# Patient Record
Sex: Female | Born: 1985 | Race: White | Hispanic: No | Marital: Single | Smoking: Current every day smoker
Health system: Southern US, Community
[De-identification: ages and names within clinical notes are randomized; demographics above are authoritative.]

## PROBLEM LIST (undated history)

## (undated) DIAGNOSIS — D649 Anemia, unspecified: Secondary | ICD-10-CM

## (undated) DIAGNOSIS — R519 Headache, unspecified: Secondary | ICD-10-CM

## (undated) DIAGNOSIS — G8929 Other chronic pain: Secondary | ICD-10-CM

## (undated) DIAGNOSIS — F17210 Nicotine dependence, cigarettes, uncomplicated: Secondary | ICD-10-CM

## (undated) DIAGNOSIS — R0600 Dyspnea, unspecified: Secondary | ICD-10-CM

## (undated) DIAGNOSIS — Z5189 Encounter for other specified aftercare: Secondary | ICD-10-CM

## (undated) DIAGNOSIS — K259 Gastric ulcer, unspecified as acute or chronic, without hemorrhage or perforation: Secondary | ICD-10-CM

## (undated) DIAGNOSIS — T7840XA Allergy, unspecified, initial encounter: Secondary | ICD-10-CM

## (undated) DIAGNOSIS — D259 Leiomyoma of uterus, unspecified: Secondary | ICD-10-CM

## (undated) DIAGNOSIS — N921 Excessive and frequent menstruation with irregular cycle: Secondary | ICD-10-CM

## (undated) DIAGNOSIS — R51 Headache: Secondary | ICD-10-CM

## (undated) HISTORY — PX: CHOLECYSTECTOMY: SHX55

## (undated) HISTORY — PX: ABDOMINAL HYSTERECTOMY: SHX81

## (undated) HISTORY — PX: TUBAL LIGATION: SHX77

## (undated) HISTORY — PX: OTHER SURGICAL HISTORY: SHX169

## (undated) HISTORY — DX: Encounter for other specified aftercare: Z51.89

## (undated) HISTORY — PX: TONSILLECTOMY: SUR1361

## (undated) HISTORY — DX: Allergy, unspecified, initial encounter: T78.40XA

---

## 2005-04-03 ENCOUNTER — Emergency Department: Payer: Self-pay | Admitting: Emergency Medicine

## 2005-05-24 ENCOUNTER — Emergency Department: Payer: Self-pay | Admitting: Emergency Medicine

## 2005-05-26 ENCOUNTER — Ambulatory Visit: Payer: Self-pay | Admitting: Emergency Medicine

## 2005-06-01 ENCOUNTER — Emergency Department: Payer: Self-pay | Admitting: Emergency Medicine

## 2005-07-10 ENCOUNTER — Observation Stay: Payer: Self-pay | Admitting: Obstetrics and Gynecology

## 2005-08-18 ENCOUNTER — Emergency Department: Payer: Self-pay | Admitting: Emergency Medicine

## 2005-10-23 ENCOUNTER — Observation Stay: Payer: Self-pay | Admitting: Obstetrics and Gynecology

## 2005-10-26 ENCOUNTER — Inpatient Hospital Stay: Payer: Self-pay

## 2006-10-19 ENCOUNTER — Emergency Department: Payer: Self-pay | Admitting: Emergency Medicine

## 2007-03-23 ENCOUNTER — Emergency Department: Payer: Self-pay | Admitting: Unknown Physician Specialty

## 2007-07-25 ENCOUNTER — Other Ambulatory Visit: Payer: Self-pay

## 2007-07-25 ENCOUNTER — Emergency Department: Payer: Self-pay | Admitting: Unknown Physician Specialty

## 2007-11-06 ENCOUNTER — Emergency Department: Payer: Self-pay | Admitting: Emergency Medicine

## 2007-12-09 ENCOUNTER — Emergency Department: Payer: Self-pay | Admitting: Emergency Medicine

## 2008-01-28 ENCOUNTER — Emergency Department: Payer: Self-pay | Admitting: Emergency Medicine

## 2008-07-04 ENCOUNTER — Emergency Department: Payer: Self-pay | Admitting: Emergency Medicine

## 2008-07-09 ENCOUNTER — Emergency Department: Payer: Self-pay | Admitting: Emergency Medicine

## 2009-02-21 ENCOUNTER — Emergency Department: Payer: Self-pay | Admitting: Emergency Medicine

## 2009-05-08 ENCOUNTER — Emergency Department: Payer: Self-pay | Admitting: Emergency Medicine

## 2009-05-14 ENCOUNTER — Emergency Department: Payer: Self-pay | Admitting: Emergency Medicine

## 2009-05-18 ENCOUNTER — Ambulatory Visit: Payer: Self-pay | Admitting: Surgery

## 2009-05-22 ENCOUNTER — Emergency Department: Payer: Self-pay | Admitting: Emergency Medicine

## 2009-12-08 ENCOUNTER — Emergency Department: Payer: Self-pay | Admitting: Unknown Physician Specialty

## 2009-12-29 ENCOUNTER — Emergency Department: Payer: Self-pay | Admitting: Emergency Medicine

## 2010-04-15 ENCOUNTER — Ambulatory Visit: Payer: Self-pay | Admitting: Family Medicine

## 2010-06-05 ENCOUNTER — Encounter: Payer: Self-pay | Admitting: Physician Assistant

## 2010-06-27 ENCOUNTER — Observation Stay: Payer: Self-pay | Admitting: Obstetrics and Gynecology

## 2010-06-29 ENCOUNTER — Encounter: Payer: Self-pay | Admitting: Physician Assistant

## 2010-07-05 ENCOUNTER — Emergency Department: Payer: Self-pay | Admitting: Emergency Medicine

## 2010-09-01 ENCOUNTER — Observation Stay: Payer: Self-pay

## 2010-09-04 ENCOUNTER — Inpatient Hospital Stay: Payer: Self-pay | Admitting: Obstetrics and Gynecology

## 2012-02-16 ENCOUNTER — Emergency Department: Payer: Self-pay | Admitting: Emergency Medicine

## 2012-10-17 ENCOUNTER — Emergency Department: Payer: Self-pay | Admitting: Emergency Medicine

## 2012-10-20 ENCOUNTER — Emergency Department: Payer: Self-pay | Admitting: Internal Medicine

## 2012-12-01 ENCOUNTER — Emergency Department: Payer: Self-pay

## 2012-12-01 LAB — COMPREHENSIVE METABOLIC PANEL
Alkaline Phosphatase: 77 U/L (ref 50–136)
Anion Gap: 4 — ABNORMAL LOW (ref 7–16)
BUN: 7 mg/dL (ref 7–18)
Bilirubin,Total: 0.2 mg/dL (ref 0.2–1.0)
Chloride: 108 mmol/L — ABNORMAL HIGH (ref 98–107)
Co2: 25 mmol/L (ref 21–32)
Creatinine: 0.48 mg/dL — ABNORMAL LOW (ref 0.60–1.30)
Glucose: 86 mg/dL (ref 65–99)
Osmolality: 271 (ref 275–301)
Potassium: 3.6 mmol/L (ref 3.5–5.1)
SGPT (ALT): 14 U/L (ref 12–78)
Sodium: 137 mmol/L (ref 136–145)
Total Protein: 7.3 g/dL (ref 6.4–8.2)

## 2012-12-01 LAB — URINALYSIS, COMPLETE
Glucose,UR: NEGATIVE mg/dL (ref 0–75)
Leukocyte Esterase: NEGATIVE
Ph: 6 (ref 4.5–8.0)
Protein: 30
RBC,UR: 15 /HPF (ref 0–5)
WBC UR: 1 /HPF (ref 0–5)

## 2012-12-01 LAB — CBC
HCT: 38.3 % (ref 35.0–47.0)
HGB: 12.8 g/dL (ref 12.0–16.0)
MCH: 28.4 pg (ref 26.0–34.0)
MCV: 85 fL (ref 80–100)
Platelet: 202 10*3/uL (ref 150–440)
RBC: 4.51 10*6/uL (ref 3.80–5.20)
RDW: 14 % (ref 11.5–14.5)

## 2012-12-01 LAB — HCG, QUANTITATIVE, PREGNANCY: Beta Hcg, Quant.: 71129 m[IU]/mL — ABNORMAL HIGH

## 2013-01-24 ENCOUNTER — Encounter: Payer: Self-pay | Admitting: Family Medicine

## 2013-01-27 ENCOUNTER — Encounter: Payer: Self-pay | Admitting: Family Medicine

## 2013-01-31 ENCOUNTER — Emergency Department: Payer: Self-pay | Admitting: Emergency Medicine

## 2013-01-31 LAB — COMPREHENSIVE METABOLIC PANEL
Alkaline Phosphatase: 93 U/L (ref 50–136)
Anion Gap: 9 (ref 7–16)
BUN: 3 mg/dL — ABNORMAL LOW (ref 7–18)
Calcium, Total: 8.4 mg/dL — ABNORMAL LOW (ref 8.5–10.1)
Chloride: 109 mmol/L — ABNORMAL HIGH (ref 98–107)
Creatinine: 0.43 mg/dL — ABNORMAL LOW (ref 0.60–1.30)
EGFR (African American): >60
EGFR (Non-African Amer.): >60
Glucose: 88 mg/dL (ref 65–99)
Osmolality: 272 (ref 275–301)
Potassium: 3.4 mmol/L — ABNORMAL LOW (ref 3.5–5.1)
SGOT(AST): 19 U/L (ref 15–37)
SGPT (ALT): 12 U/L (ref 12–78)
Sodium: 138 mmol/L (ref 136–145)
Total Protein: 6.6 g/dL (ref 6.4–8.2)

## 2013-01-31 LAB — URINALYSIS, COMPLETE
Ketone: NEGATIVE
Leukocyte Esterase: NEGATIVE
Ph: 6 (ref 4.5–8.0)
Protein: NEGATIVE
Squamous Epithelial: 4
WBC UR: <1 /HPF (ref 0–5)

## 2013-01-31 LAB — LIPASE, BLOOD: Lipase: 97 U/L (ref 73–393)

## 2013-01-31 LAB — CBC
HCT: 32.9 % — ABNORMAL LOW (ref 35.0–47.0)
MCH: 28.7 pg (ref 26.0–34.0)
Platelet: 207 10*3/uL (ref 150–440)
RBC: 3.89 10*6/uL (ref 3.80–5.20)
WBC: 8 10*3/uL (ref 3.6–11.0)

## 2013-03-03 ENCOUNTER — Emergency Department: Payer: Self-pay | Admitting: Emergency Medicine

## 2013-03-08 ENCOUNTER — Emergency Department: Payer: Self-pay | Admitting: Emergency Medicine

## 2013-04-26 ENCOUNTER — Observation Stay: Payer: Self-pay | Admitting: Obstetrics and Gynecology

## 2013-04-26 LAB — URINALYSIS, COMPLETE
Nitrite: NEGATIVE
Ph: 6 (ref 4.5–8.0)
Protein: NEGATIVE
RBC,UR: <1 /HPF (ref 0–5)
Specific Gravity: 1.009 (ref 1.003–1.030)
Squamous Epithelial: 4
WBC UR: 1 /HPF (ref 0–5)

## 2013-05-14 ENCOUNTER — Observation Stay: Payer: Self-pay

## 2013-05-18 ENCOUNTER — Observation Stay: Payer: Self-pay

## 2013-06-05 ENCOUNTER — Observation Stay: Payer: Self-pay

## 2013-06-09 ENCOUNTER — Ambulatory Visit: Payer: Self-pay | Admitting: Obstetrics and Gynecology

## 2013-06-09 LAB — CBC WITH DIFFERENTIAL/PLATELET
Basophil #: 0 10*3/uL (ref 0.0–0.1)
Eosinophil #: 0.1 10*3/uL (ref 0.0–0.7)
HCT: 30.7 % — ABNORMAL LOW (ref 35.0–47.0)
Lymphocyte %: 18.3 %
MCHC: 32.4 g/dL (ref 32.0–36.0)
MCV: 74 fL — ABNORMAL LOW (ref 80–100)
Monocyte %: 3.6 %
Neutrophil #: 6.9 10*3/uL — ABNORMAL HIGH (ref 1.4–6.5)
Neutrophil %: 76.6 %
RBC: 4.17 10*6/uL (ref 3.80–5.20)
RDW: 17.8 % — ABNORMAL HIGH (ref 11.5–14.5)
WBC: 9 10*3/uL (ref 3.6–11.0)

## 2013-06-10 ENCOUNTER — Inpatient Hospital Stay: Payer: Self-pay | Admitting: Obstetrics and Gynecology

## 2013-06-11 LAB — HEMATOCRIT: HCT: 26.7 % — ABNORMAL LOW (ref 35.0–47.0)

## 2014-01-03 ENCOUNTER — Emergency Department: Payer: Self-pay | Admitting: Emergency Medicine

## 2014-09-09 ENCOUNTER — Emergency Department: Payer: Self-pay | Admitting: Emergency Medicine

## 2014-09-09 LAB — URINALYSIS, COMPLETE
BILIRUBIN, UR: NEGATIVE
Bacteria: NONE SEEN
Glucose,UR: NEGATIVE mg/dL (ref 0–75)
Ketone: NEGATIVE
NITRITE: NEGATIVE
PROTEIN: NEGATIVE
Ph: 5 (ref 4.5–8.0)
RBC,UR: 12 /HPF (ref 0–5)
SPECIFIC GRAVITY: 1.025 (ref 1.003–1.030)
Squamous Epithelial: 1

## 2014-09-11 LAB — URINE CULTURE

## 2014-11-17 ENCOUNTER — Emergency Department: Payer: Self-pay | Admitting: Emergency Medicine

## 2015-01-19 NOTE — Op Note (Signed)
PATIENT NAME:  Faith Ellis, Faith Ellis MR#:  [id number] DATE OF BIRTH:  1986/05/25  DATE OF PROCEDURE:  06/10/2013  PREOPERATIVE DIAGNOSES:  1. Elective repeat cesarean section, 39 plus 6 weeks' gestation.  2. Elective permanent sterilization.  3. Elective skin tag removal.   POSTOPERATIVE DIAGNOSES:  1. Elective repeat cesarean section, 39 plus 6 weeks' gestation.  2. Elective permanent sterilization.  3. Elective skin tag removal.  4. Left paraovarian cyst.   PROCEDURES:  1. Repeat low transverse cesarean section.  2. Bilateral tubal ligation, [place].  3. Left paraovarian cystectomy.  4. Epithelial skin tag removal. 5. On-Q pump placement.   ANESTHESIA: Spinal.   SURGEON: Boykin Nearing, MD  FIRST ASSISTANT: Scrub tech Brame.   INDICATION: This is a 29 year old gravida 4, para 3 patient, EDC of 06/11/2013. The patient with a previous cesarean section, elects for repeat cesarean section and elective permanent sterilization.   PROCEDURE: After adequate spinal anesthesia, the patient was placed in the dorsal supine position with hip roll under the right side. The patient received 2 grams of IV Ancef prior to commencement of the case. A Pfannenstiel incision was made 2 fingerbreadths above the symphysis pubis. Sharp dissection was used to identify the fascia. The fascia was opened in the midline and opened in a transverse fashion. The superior aspect of the fascia was grasped with Kocher clamps and the recti muscles dissected free. The inferior aspect of the fascia was grasped with Kocher clamps, and the pyramidalis muscle was dissected free. Entry into the peritoneal cavity was accomplished sharply. The vesicouterine peritoneal fold was identified, and a bladder flap was created, and the bladder was reflected inferiorly. A low transverse uterine incision was made. Upon entry into the endometrial cavity, clear fluid resulted. The fetal head was brought to the incision. A vacuum was  applied to the occiput, and with 1 gentle pull, the head was delivered, and the vacuum was removed. The shoulders and body delivered without difficulty. Cord was doubly clamped. Vigorous female was passed to nursery staff, who assigned Apgar scores of 9 and 9. The placenta was manually delivered. The uterus was exteriorized, and the endometrial cavity was wiped clean with laparotomy tape. The cervix was opened with ring forceps. Uterine incision was closed with 1 chromic suture in a running locking fashion. Attention was directed to the fallopian tubes. The right fallopian tube was grasped at the midportion, and 2 separate 0 plain gut sutures were applied and a 1.5 cm portion of the fallopian tube removed. A similar procedure was repeated on the patient's left fallopian tube. Again, after grasping the tube at the midportion, 2 separate 0 plain gut sutures were applied and a 1.5 cm portion of the fallopian tube removed. There was a bilobed left paraovarian cyst that was identified with some purulent material. The cyst was clamped, removed and sutured. The cyst will be sent to pathology for identification. Posterior cul-de-sac was irrigated and suctioned, and the uterus was placed back into the abdominal cavity. The paracolic gutters were wiped clean with laparotomy tape. The tubal ligation sites appeared hemostatic as well as the uterine incision site. INTERCEED was placed over the uterine incision in a T-shaped fashion. The superior aspect of the fascia was regrasped with Kocher clamps, and the On-Q pump catheters were advanced from an infraumbilical to a subfascial position, and the fascia was then closed over top of this with 0 Vicryl suture in a running nonlocking fashion. Subcutaneous tissues were irrigated and bovied for hemostasis. Skin  was reapproximated with staples. There was a fibroepithelial skin tag approximately 3 cm from the left lateral aspect of the Pfannenstiel incision. This was grasped and  removed, and this tissue will not be sent to pathology. A single 3-0 Vicryl suture placed to close the dermis. The On-Q pump catheters were secured at the skin level with Dermabond, and Steri-Strip and Tegaderm placed over these catheters, and each catheter was loaded with 5 mL of 0.5% Marcaine. There were no complications. Estimated blood loss 500 mL. Intraoperative fluids 800 mL. The patient tolerated the procedure well and was taken sent to the recovery room in good condition.    ____________________________ Boykin Nearing, MD tjs:OSi D: 06/10/2013 08:36:47 ET T: 06/10/2013 08:49:17 ET JOB#: [id number]  cc: Boykin Nearing, MD, <Dictator> Boykin Nearing MD ELECTRONICALLY SIGNED 06/12/2013 9:48

## 2015-02-06 NOTE — H&P (Signed)
L&D Evaluation:  History:  HPI 29 yo G4P3003 with LMP of 09/04/12 & EDD of 06/11/13 with EDD of 06/11/13 with PN C at [facility] significant for hx abnormal pap, 2 SVD and then LTCS with last baby, smoker who is now down to 2 cigs a day, hepatomegaly with normal LFT's, Poor compliance with Prenatal visits, anemia. No ROM, VB or decreased FM presents for UC's today.   Presents with contractions   Patient's Surgical History LTCS   Medications Pre Natal Vitamins   Allergies Keflex causes rash   Social History tobacco  now 2 cigs a day   Family History Non-Contributory   ROS:  ROS All systems were reviewed.  HEENT, CNS, GI, GU, Respiratory, CV, Renal and Musculoskeletal systems were found to be normal.   Exam:  Vital Signs stable   General no apparent distress   Mental Status clear   Chest clear   Heart normal sinus rhythm, no murmur/gallop/rubs   Abdomen gravid, non-tender   Estimated Fetal Weight Average for gestational age   Back no CVAT   Pelvic int os closed   Mebranes Intact   FHT normal rate with no decels   Ucx regular   Skin dry   Lymph no lymphadenopathy   Impression:  Impression IUP at 36 weeks with Braxton-hicks   Plan:  Plan Terb stopped UC's, DC home   Electronic Signatures: Catheryn Bacon (CNM)  (Signed 16-Aug-14 17:51)  Authored: L&D Evaluation   Last Updated: 16-Aug-14 17:51 by Catheryn Bacon (CNM)

## 2015-02-06 NOTE — H&P (Signed)
L&D Evaluation:  History:  HPI 29 yo G4P3003 with LMP of 09/04/12 & EDD of 06/11/13 & L 13 1/7 wks with EDd of 06/07/13 here for labor S/S and desires a shot of Terb since her partner is in [facility] and she does not want to deliver before he gets back. No ROM, No VB, no decreased FM or any other S/S.   Presents with contractions   Patient's Medical History Abnormal Pap, late entry to care at 16 weeks, hepatomegaly with normal LFT's,   Patient's Surgical History Previous C-Section   Medications Pre Natal Vitamins   Allergies Keflex causes rash   Social History tobacco   Family History Non-Contributory   ROS:  ROS All systems were reviewed.  HEENT, CNS, GI, GU, Respiratory, CV, Renal and Musculoskeletal systems were found to be normal.   Exam:  Vital Signs stable  BP 120/60   General no apparent distress   Mental Status clear   Chest clear   Heart normal sinus rhythm, no murmur/gallop/rubs   Abdomen gravid, non-tender   Estimated Fetal Weight Average for gestational age   Fetal Position Arm with vtx in lower abd   Back no CVAT   Reflexes 1+   Clonus negative   Pelvic FT/.30%/arm with vtx   Mebranes Intact   FHT normal rate with no decels   Ucx irregular   Skin dry   Lymph no lymphadenopathy   Impression:  Impression IUP at 39 1/7 weeks   Plan:  Plan monitor contractions and for cervical change, If pt is not in labor, will dc home   Comments NST reactive with 2 accels 15 x 15 bpm.   Electronic Signatures: Catheryn Bacon (CNM)  (Signed 07-Sep-14 21:16)  Authored: L&D Evaluation   Last Updated: 07-Sep-14 21:16 by Catheryn Bacon (CNM)

## 2015-12-20 ENCOUNTER — Emergency Department
Admission: EM | Admit: 2015-12-20 | Discharge: 2015-12-20 | Disposition: A | Discharge status: Home / Self Care | Payer: Self-pay | Attending: Emergency Medicine | Admitting: Emergency Medicine

## 2015-12-20 DIAGNOSIS — J309 Allergic rhinitis, unspecified: Secondary | ICD-10-CM | POA: Insufficient documentation

## 2015-12-20 DIAGNOSIS — A084 Viral intestinal infection, unspecified: Secondary | ICD-10-CM | POA: Insufficient documentation

## 2015-12-20 DIAGNOSIS — T700XXA Otitic barotrauma, initial encounter: Secondary | ICD-10-CM | POA: Insufficient documentation

## 2015-12-20 DIAGNOSIS — R197 Diarrhea, unspecified: Secondary | ICD-10-CM | POA: Insufficient documentation

## 2015-12-20 MED ORDER — ONDANSETRON 4 MG PO TBDP: 4.0000 mg | ORAL_TABLET | Freq: Three times a day (TID) | ORAL | Status: DC | PRN

## 2015-12-20 MED ORDER — CETIRIZINE HCL 10 MG PO TABS: 10.0000 mg | ORAL_TABLET | Freq: Every day | ORAL | Status: DC

## 2015-12-20 MED ORDER — FLUTICASONE PROPIONATE 50 MCG/ACT NA SUSP: 1.0000 | Freq: Two times a day (BID) | NASAL | Status: DC

## 2015-12-20 NOTE — ED Notes (Signed)
States she developed some nausea with diarrhea on tuesday Decreased PO intake  Headache unsure of fever

## 2015-12-20 NOTE — ED Notes (Signed)
Pt c/o N/V/d since Wednesday, 3 children have the same sx.

## 2015-12-20 NOTE — Discharge Instructions (Signed)
Allergic Rhinitis Allergic rhinitis is when the mucous membranes in the nose respond to allergens. Allergens are particles in the air that cause your body to have an allergic reaction. This causes you to release allergic antibodies. Through a chain of events, these eventually cause you to release histamine into the blood stream. Although meant to protect the body, it is this release of histamine that causes your discomfort, such as frequent sneezing, congestion, and an itchy, runny nose.  CAUSES Seasonal allergic rhinitis (hay fever) is caused by pollen allergens that may come from grasses, trees, and weeds. Year-round allergic rhinitis (perennial allergic rhinitis) is caused by allergens such as house dust mites, pet dander, and mold spores. SYMPTOMS  Nasal stuffiness (congestion).  Itchy, runny nose with sneezing and tearing of the eyes. DIAGNOSIS Your health care provider can help you determine the allergen or allergens that trigger your symptoms. If you and your health care provider are unable to determine the allergen, skin or blood testing may be used. Your health care provider will diagnose your condition after taking your health history and performing a physical exam. Your health care provider may assess you for other related conditions, such as asthma, pink eye, or an ear infection. TREATMENT Allergic rhinitis does not have a cure, but it can be controlled by:  Medicines that block allergy symptoms. These may include allergy shots, nasal sprays, and oral antihistamines.  Avoiding the allergen. Hay fever may often be treated with antihistamines in pill or nasal spray forms. Antihistamines block the effects of histamine. There are over-the-counter medicines that may help with nasal congestion and swelling around the eyes. Check with your health care provider before taking or giving this medicine. If avoiding the allergen or the medicine prescribed do not work, there are many new medicines  your health care provider can prescribe. Stronger medicine may be used if initial measures are ineffective. Desensitizing injections can be used if medicine and avoidance does not work. Desensitization is when a patient is given ongoing shots until the body becomes less sensitive to the allergen. Make sure you follow up with your health care provider if problems continue. HOME CARE INSTRUCTIONS It is not possible to completely avoid allergens, but you can reduce your symptoms by taking steps to limit your exposure to them. It helps to know exactly what you are allergic to so that you can avoid your specific triggers. SEEK MEDICAL CARE IF:  You have a fever.  You develop a cough that does not stop easily (persistent).  You have shortness of breath.  You start wheezing.  Symptoms interfere with normal daily activities.   This information is not intended to replace advice given to you by your health care provider. Make sure you discuss any questions you have with your health care provider.   Document Released: 06/10/2001 Document Revised: 10/06/2014 Document Reviewed: 05/23/2013 Elsevier Interactive Patient Education 2016 [place] media is inflammation of your middle ear. This occurs when the auditory tube (eustachian tube) leading from the back of your nose (nasopharynx) to your eardrum is blocked. This blockage may result from a cold, environmental allergies, or an upper respiratory infection. Unresolved barotitis media may lead to damage or hearing loss (barotrauma), which may become permanent. HOME CARE INSTRUCTIONS   Use medicines as recommended by your health care provider. Over-the-counter medicines will help unblock the canal and can help during times of air travel.  Do not put anything into your ears to clean or unplug them.  Eardrops will not be helpful.  Do not swim, dive, or fly until your health care provider says it is all right to do so. If  these activities are necessary, chewing gum with frequent, forceful swallowing may help. It is also helpful to hold your nose and gently blow to pop your ears for equalizing pressure changes. This forces air into the eustachian tube.  Only take over-the-counter or prescription medicines for pain, discomfort, or fever as directed by your health care provider.  A decongestant may be helpful in decongesting the middle ear and make pressure equalization easier. SEEK MEDICAL CARE IF:  You experience a serious form of dizziness in which you feel as if the room is spinning and you feel nauseated (vertigo).  Your symptoms only involve one ear. SEEK IMMEDIATE MEDICAL CARE IF:   You develop a severe headache, dizziness, or severe ear pain.  You have bloody or pus-like drainage from your ears.  You develop a fever.  Your problems do not improve or become worse. MAKE SURE YOU:   Understand these instructions.  Will watch your condition.  Will get help right away if you are not doing well or get worse.   This information is not intended to replace advice given to you by your health care provider. Make sure you discuss any questions you have with your health care provider.   Document Released: 09/12/2000 Document Revised: 07/06/2013 Document Reviewed: 04/12/2013 Elsevier Interactive Patient Education 2016 Elsevier Inc[employer]  Viral Gastroenteritis Viral gastroenteritis is also known as stomach flu. This condition affects the stomach and intestinal tract. It can cause sudden diarrhea and vomiting. The illness typically lasts 3 to 8 days. Most people develop an immune response that eventually gets rid of the virus. While this natural response develops, the virus can make you quite ill. CAUSES  Many different viruses can cause gastroenteritis, such as rotavirus or noroviruses. You can catch one of these viruses by consuming contaminated food or water. You may also catch a virus by sharing utensils or  other personal items with an infected person or by touching a contaminated surface. SYMPTOMS  The most common symptoms are diarrhea and vomiting. These problems can cause a severe loss of body fluids (dehydration) and a body salt (electrolyte) imbalance. Other symptoms may include:  Fever.  Headache.  Fatigue.  Abdominal pain. DIAGNOSIS  Your caregiver can usually diagnose viral gastroenteritis based on your symptoms and a physical exam. A stool sample may also be taken to test for the presence of viruses or other infections. TREATMENT  This illness typically goes away on its own. Treatments are aimed at rehydration. The most serious cases of viral gastroenteritis involve vomiting so severely that you are not able to keep fluids down. In these cases, fluids must be given through an intravenous line (IV). HOME CARE INSTRUCTIONS   Drink enough fluids to keep your urine clear or pale yellow. Drink small amounts of fluids frequently and increase the amounts as tolerated.  Ask your caregiver for specific rehydration instructions.  Avoid:  Foods high in sugar.  Alcohol.  Carbonated drinks.  Tobacco.  Juice.  Caffeine drinks.  Extremely hot or cold fluids.  Fatty, greasy foods.  Too much intake of anything at one time.  Dairy products until 24 to 48 hours after diarrhea stops.  You may consume probiotics. Probiotics are active cultures of beneficial bacteria. They may lessen the amount and number of diarrheal stools in adults. Probiotics can be found in yogurt with active cultures  and in supplements.  Wash your hands well to avoid spreading the virus.  Only take over-the-counter or prescription medicines for pain, discomfort, or fever as directed by your caregiver. Do not give aspirin to children. Antidiarrheal medicines are not recommended.  Ask your caregiver if you should continue to take your regular prescribed and over-the-counter medicines.  Keep all follow-up  appointments as directed by your caregiver. SEEK IMMEDIATE MEDICAL CARE IF:   You are unable to keep fluids down.  You do not urinate at least once every 6 to 8 hours.  You develop shortness of breath.  You notice blood in your stool or vomit. This may look like coffee grounds.  You have abdominal pain that increases or is concentrated in one small area (localized).  You have persistent vomiting or diarrhea.  You have a fever.  The patient is a child younger than 3 months, and he or she has a fever.  The patient is a child older than 3 months, and he or she has a fever and persistent symptoms.  The patient is a child older than 3 months, and he or she has a fever and symptoms suddenly get worse.  The patient is a baby, and he or she has no tears when crying. MAKE SURE YOU:   Understand these instructions.  Will watch your condition.  Will get help right away if you are not doing well or get worse.   This information is not intended to replace advice given to you by your health care provider. Make sure you discuss any questions you have with your health care provider.   Document Released: 09/15/2005 Document Revised: 12/08/2011 Document Reviewed: 07/02/2011 Elsevier Interactive Patient Education 2016 [place] Choices to Help Relieve Diarrhea, Adult When you have diarrhea, the foods you eat and your eating habits are very important. Choosing the right foods and drinks can help relieve diarrhea. Also, because diarrhea can last up to 7 days, you need to replace lost fluids and electrolytes (such as sodium, potassium, and chloride) in order to help prevent dehydration.  WHAT GENERAL GUIDELINES DO I NEED TO FOLLOW?  Slowly drink 1 cup (8 oz) of fluid for each episode of diarrhea. If you are getting enough fluid, your urine will be clear or pale yellow.  Eat starchy foods. Some good choices include white rice, white toast, pasta, low-fiber cereal, baked potatoes  (without the skin), saltine crackers, and bagels.  Avoid large servings of any cooked vegetables.  Limit fruit to two servings per day. A serving is  cup or 1 small piece.  Choose foods with less than 2 g of fiber per serving.  Limit fats to less than 8 tsp (38 g) per day.  Avoid fried foods.  Eat foods that have probiotics in them. Probiotics can be found in certain dairy products.  Avoid foods and beverages that may increase the speed at which food moves through the stomach and intestines (gastrointestinal tract). Things to avoid include:  High-fiber foods, such as dried fruit, raw fruits and vegetables, nuts, seeds, and whole grain foods.  Spicy foods and high-fat foods.  Foods and beverages sweetened with high-fructose corn syrup, honey, or sugar alcohols such as xylitol, sorbitol, and mannitol. WHAT FOODS ARE RECOMMENDED? Grains White rice. White, [place], or pita breads (fresh or toasted), including plain rolls, buns, or bagels. White pasta. Saltine, soda, or graham crackers. Pretzels. Low-fiber cereal. Cooked cereals made with water (such as cornmeal, farina, or cream cereals). Plain muffins. Matzo.  Melba toast. Zwieback.  Vegetables Potatoes (without the skin). Strained tomato and vegetable juices. Most well-cooked and canned vegetables without seeds. Tender lettuce. Fruits Cooked or canned applesauce, apricots, cherries, fruit cocktail, grapefruit, peaches, pears, or plums. Fresh bananas, apples without skin, cherries, grapes, cantaloupe, grapefruit, peaches, oranges, or plums.  Meat and Other Protein Products Baked or boiled chicken. Eggs. Tofu. Fish. Seafood. Smooth peanut butter. Ground or well-cooked tender beef, ham, veal, lamb, pork, or poultry.  Dairy Plain yogurt, kefir, and unsweetened liquid yogurt. Lactose-free milk, buttermilk, or soy milk. Plain hard cheese. Beverages Sport drinks. Clear broths. Diluted fruit juices (except prune). Regular, caffeine-free sodas  such as ginger ale. Water. Decaffeinated teas. Oral rehydration solutions. Sugar-free beverages not sweetened with sugar alcohols. Other Bouillon, broth, or soups made from recommended foods.  The items listed above may not be a complete list of recommended foods or beverages. Contact your dietitian for more options. WHAT FOODS ARE NOT RECOMMENDED? Grains Whole grain, whole wheat, bran, or rye breads, rolls, pastas, crackers, and cereals. Wild or brown rice. Cereals that contain more than 2 g of fiber per serving. Corn tortillas or taco shells. Cooked or dry oatmeal. Granola. Popcorn. Vegetables Raw vegetables. Cabbage, broccoli, Brussels sprouts, artichokes, baked beans, beet greens, corn, kale, legumes, peas, sweet potatoes, and yams. Potato skins. Cooked spinach and cabbage. Fruits Dried fruit, including raisins and dates. Raw fruits. Stewed or dried prunes. Fresh apples with skin, apricots, mangoes, pears, raspberries, and strawberries.  Meat and Other Protein Products Chunky peanut butter. Nuts and seeds. Beans and lentils. Berniece Salines.  Dairy High-fat cheeses. Milk, chocolate milk, and beverages made with milk, such as milk shakes. Cream. Ice cream. Sweets and Desserts Sweet rolls, doughnuts, and sweet breads. Pancakes and waffles. Fats and Oils Butter. Cream sauces. Margarine. Salad oils. Plain salad dressings. Olives. Avocados.  Beverages Caffeinated beverages (such as coffee, tea, soda, or energy drinks). Alcoholic beverages. Fruit juices with pulp. Prune juice. Soft drinks sweetened with high-fructose corn syrup or sugar alcohols. Other Coconut. Hot sauce. Chili powder. Mayonnaise. Gravy. Cream-based or milk-based soups.  The items listed above may not be a complete list of foods and beverages to avoid. Contact your dietitian for more information. WHAT SHOULD I DO IF I BECOME DEHYDRATED? Diarrhea can sometimes lead to dehydration. Signs of dehydration include dark urine and dry mouth and  skin. If you think you are dehydrated, you should rehydrate with an oral rehydration solution. These solutions can be purchased at pharmacies, retail stores, or online.  Drink -1 cup (120-240 mL) of oral rehydration solution each time you have an episode of diarrhea. If drinking this amount makes your diarrhea worse, try drinking smaller amounts more often. For example, drink 1-3 tsp (5-15 mL) every 5-10 minutes.  A general rule for staying hydrated is to drink 1-2 L of fluid per day. Talk to your health care provider about the specific amount you should be drinking each day. Drink enough fluids to keep your urine clear or pale yellow.   This information is not intended to replace advice given to you by your health care provider. Make sure you discuss any questions you have with your health care provider.   Document Released: 12/06/2003 Document Revised: 10/06/2014 Document Reviewed: 08/08/2013 Elsevier Interactive Patient Education [employer].

## 2015-12-20 NOTE — ED Provider Notes (Signed)
[facility] Emergency Department Provider Note  ____________________________________________  Time seen: Approximately 3:03 PM  I have reviewed the triage vital signs and the nursing notes.   HISTORY  Chief Complaint Emesis and Diarrhea    HPI Faith Ellis is a 30 y.o. female who presents emergency department complaining of nausea, vomiting, diarrhea 2 days. Per the patient her 3 children have same symptoms. She has had an intermittent headache with same. She denies any fever or chills. Patient denies any abdominal pain. She states that emesis is nonbilious. Diarrhea is nonbloody. Patient has had a slightly decreased appetite but still maintains good oral intake of fluids.Patient is also endorsing some nasal congestion and right ear fullness. She denies any pain. She denies any sore throat or cough.   History reviewed. No pertinent past medical history.  There are no active problems to display for this patient.   History reviewed. No pertinent past surgical history.  Current Outpatient Rx  Name  Route  Sig  Dispense  Refill  . cetirizine (ZYRTEC) 10 MG tablet   Oral   Take 1 tablet (10 mg total) by mouth daily.   30 tablet   0   . fluticasone (FLONASE) 50 MCG/ACT nasal spray   Each Nare   Place 1 spray into both nostrils 2 (two) times daily.   16 g   0   . ondansetron (ZOFRAN-ODT) 4 MG disintegrating tablet   Oral   Take 1 tablet (4 mg total) by mouth every 8 (eight) hours as needed for nausea or vomiting.   20 tablet   0     Allergies Keflex  No family history on file.  Social History Social History  Substance Use Topics  . Smoking status: Never Smoker   . Smokeless tobacco: None  . Alcohol Use: No     Review of Systems  Constitutional: No fever/chills Cardiovascular: no chest pain. Respiratory: no cough. No SOB. Gastrointestinal: No abdominal pain.  Positive for nausea, vomiting, diarrhea.  No  constipation. Genitourinary: Negative for dysuria. No hematuria Musculoskeletal: Negative for back pain. Skin: Negative for rash. Neurological: Endorses intermittent headache but denies focal weakness or numbness. 10-point ROS otherwise negative.  ____________________________________________   PHYSICAL EXAM:  VITAL SIGNS: ED Triage Vitals  Enc Vitals Group     BP 12/20/15 1358 108/48 mmHg     Pulse Rate 12/20/15 1358 100     Resp 12/20/15 1358 18     Temp 12/20/15 1358 98.5 F (36.9 C)     Temp Source 12/20/15 1358 Oral     SpO2 12/20/15 1358 100 %     Weight 12/20/15 1358 137 lb (62.143 kg)     Height 12/20/15 1358 5\' 3"  (1.6 m)     Head Cir --      Peak Flow --      Pain Score --      Pain Loc --      Pain Edu? --      Excl. in [place]? --      Constitutional: Alert and oriented. Well appearing and in no acute distress. Eyes: Conjunctivae are normal. PERRL. EOMI. Head: Atraumatic. ENT:      Ears: EACs are unremarkable bilaterally. TMs are mildly bulging worse on right than left. No air-fluid level.      Nose: No congestion/rhinnorhea. Turbinates are boggy in appearance.      Mouth/Throat: Mucous membranes are moist. Oropharynx is not erythematous and not edematous. Neck: No stridor.   Hematological/Lymphatic/Immunilogical:  No cervical lymphadenopathy. Cardiovascular: Normal rate, regular rhythm. Normal S1 and S2.  Good peripheral circulation. Respiratory: Normal respiratory effort without tachypnea or retractions. Lungs CTAB. Gastrointestinal: Soft and nontender to palpation. No guarding or rigidity. No palpable masses.. No distention. No CVA tenderness. Neurologic:  Normal speech and language. No gross focal neurologic deficits are appreciated. Cranial nerves II through XII are grossly intact. Skin:  Skin is warm, dry and intact. No rash noted. Psychiatric: Mood and affect are normal. Speech and behavior are normal. Patient exhibits appropriate insight and  judgement.   ____________________________________________   LABS (all labs ordered are listed, but only abnormal results are displayed)  Labs Reviewed - No data to display ____________________________________________  EKG   ____________________________________________  RADIOLOGY   No results found.  ____________________________________________    PROCEDURES  Procedure(s) performed:       Medications - No data to display   ____________________________________________   INITIAL IMPRESSION / ASSESSMENT AND PLAN / ED COURSE  Pertinent labs & imaging results that were available during my care of the patient were reviewed by me and considered in my medical decision making (see chart for details).  Patient's diagnosis is consistent with viral gastroenteritis and allergic rhinitis with prior otitis media. Patient's exam is reassuring. This time no labs or imaging are ordered.. Patient will be discharged home with prescriptions for Zofran, Flonase, Zyrtec. Patient is to follow up with primary care provider if symptoms persist past this treatment course. Patient is given ED precautions to return to the ED for any worsening or new symptoms.     ____________________________________________  FINAL CLINICAL IMPRESSION(S) / ED DIAGNOSES  Final diagnoses:  Viral gastroenteritis  Allergic rhinitis, unspecified allergic rhinitis type  Barotitis media, initial encounter      NEW MEDICATIONS STARTED DURING THIS VISIT:  New Prescriptions   CETIRIZINE (ZYRTEC) 10 MG TABLET    Take 1 tablet (10 mg total) by mouth daily.   FLUTICASONE (FLONASE) 50 MCG/ACT NASAL SPRAY    Place 1 spray into both nostrils 2 (two) times daily.   ONDANSETRON (ZOFRAN-ODT) 4 MG DISINTEGRATING TABLET    Take 1 tablet (4 mg total) by mouth every 8 (eight) hours as needed for nausea or vomiting.        This chart was dictated using voice recognition software/Dragon. Despite best efforts to  proofread, errors can occur which can change the meaning. Any change was purely unintentional.    Darletta Moll, PA-C 12/20/15 [place], MD 12/20/15 [phone]

## 2016-01-29 ENCOUNTER — Encounter: Payer: Self-pay | Admitting: Medical Oncology

## 2016-01-29 ENCOUNTER — Inpatient Hospital Stay
Admission: EM | Admit: 2016-01-29 | Discharge: 2016-02-02 | DRG: 379 | Disposition: A | Discharge status: Home / Self Care | Payer: Self-pay | Attending: Internal Medicine | Admitting: Internal Medicine

## 2016-01-29 ENCOUNTER — Emergency Department: Payer: Self-pay

## 2016-01-29 DIAGNOSIS — R51 Headache: Secondary | ICD-10-CM | POA: Diagnosis present

## 2016-01-29 DIAGNOSIS — N920 Excessive and frequent menstruation with regular cycle: Secondary | ICD-10-CM | POA: Diagnosis present

## 2016-01-29 DIAGNOSIS — K254 Chronic or unspecified gastric ulcer with hemorrhage: Principal | ICD-10-CM | POA: Diagnosis present

## 2016-01-29 DIAGNOSIS — T39395A Adverse effect of other nonsteroidal anti-inflammatory drugs [NSAID], initial encounter: Secondary | ICD-10-CM | POA: Diagnosis present

## 2016-01-29 DIAGNOSIS — Z8742 Personal history of other diseases of the female genital tract: Secondary | ICD-10-CM

## 2016-01-29 DIAGNOSIS — D500 Iron deficiency anemia secondary to blood loss (chronic): Secondary | ICD-10-CM | POA: Diagnosis present

## 2016-01-29 DIAGNOSIS — N946 Dysmenorrhea, unspecified: Secondary | ICD-10-CM | POA: Diagnosis present

## 2016-01-29 DIAGNOSIS — R519 Headache, unspecified: Secondary | ICD-10-CM

## 2016-01-29 DIAGNOSIS — Z7982 Long term (current) use of aspirin: Secondary | ICD-10-CM

## 2016-01-29 DIAGNOSIS — R1030 Lower abdominal pain, unspecified: Secondary | ICD-10-CM

## 2016-01-29 DIAGNOSIS — R102 Pelvic and perineal pain: Secondary | ICD-10-CM

## 2016-01-29 DIAGNOSIS — D649 Anemia, unspecified: Secondary | ICD-10-CM | POA: Diagnosis present

## 2016-01-29 DIAGNOSIS — R109 Unspecified abdominal pain: Secondary | ICD-10-CM

## 2016-01-29 DIAGNOSIS — Z881 Allergy status to other antibiotic agents status: Secondary | ICD-10-CM

## 2016-01-29 DIAGNOSIS — F17210 Nicotine dependence, cigarettes, uncomplicated: Secondary | ICD-10-CM | POA: Diagnosis present

## 2016-01-29 DIAGNOSIS — N83519 Torsion of ovary and ovarian pedicle, unspecified side: Secondary | ICD-10-CM

## 2016-01-29 HISTORY — DX: Headache, unspecified: R51.9

## 2016-01-29 HISTORY — DX: Headache: R51

## 2016-01-29 HISTORY — DX: Other chronic pain: G89.29

## 2016-01-29 LAB — COMPREHENSIVE METABOLIC PANEL
ALBUMIN: 4.4 g/dL (ref 3.5–5.0)
ALT: 11 U/L — AB (ref 14–54)
AST: 17 U/L (ref 15–41)
Alkaline Phosphatase: 81 U/L (ref 38–126)
Anion gap: 10 (ref 5–15)
BUN: 9 mg/dL (ref 6–20)
CHLORIDE: 109 mmol/L (ref 101–111)
CO2: 19 mmol/L — AB (ref 22–32)
CREATININE: 0.49 mg/dL (ref 0.44–1.00)
Calcium: 9.1 mg/dL (ref 8.9–10.3)
GFR calc Af Amer: >60 mL/min (ref >60)
GFR calc non Af Amer: >60 mL/min (ref >60)
Glucose, Bld: 123 mg/dL — ABNORMAL HIGH (ref 65–99)
POTASSIUM: 3.8 mmol/L (ref 3.5–5.1)
SODIUM: 138 mmol/L (ref 135–145)
Total Bilirubin: 0.2 mg/dL — ABNORMAL LOW (ref 0.3–1.2)
Total Protein: 7.2 g/dL (ref 6.5–8.1)

## 2016-01-29 LAB — PREGNANCY, URINE: PREG TEST UR: NEGATIVE

## 2016-01-29 LAB — CBC
HEMATOCRIT: 20.1 % — AB (ref 35.0–47.0)
Hemoglobin: 5.2 g/dL — ABNORMAL LOW (ref 12.0–16.0)
MCH: 13 pg — AB (ref 26.0–34.0)
MCHC: 25.9 g/dL — ABNORMAL LOW (ref 32.0–36.0)
MCV: 50.2 fL — AB (ref 80.0–100.0)
PLATELETS: 140 10*3/uL — AB (ref 150–440)
RBC: 4.01 MIL/uL (ref 3.80–5.20)
RDW: 23 % — AB (ref 11.5–14.5)
WBC: 6.6 10*3/uL (ref 3.6–11.0)

## 2016-01-29 LAB — URINALYSIS COMPLETE WITH MICROSCOPIC (ARMC ONLY)
BACTERIA UA: NONE SEEN
BILIRUBIN URINE: NEGATIVE
Glucose, UA: NEGATIVE mg/dL
HGB URINE DIPSTICK: NEGATIVE
Ketones, ur: NEGATIVE mg/dL
Leukocytes, UA: NEGATIVE
Nitrite: NEGATIVE
PH: 6 (ref 5.0–8.0)
Protein, ur: NEGATIVE mg/dL
Specific Gravity, Urine: 1.01 (ref 1.005–1.030)

## 2016-01-29 LAB — IRON AND TIBC
Iron: 8 ug/dL — ABNORMAL LOW (ref 28–170)
Saturation Ratios: 2 % — ABNORMAL LOW (ref 10.4–31.8)
TIBC: 497 ug/dL — ABNORMAL HIGH (ref 250–450)
UIBC: 489 ug/dL

## 2016-01-29 LAB — HCG, QUANTITATIVE, PREGNANCY

## 2016-01-29 LAB — LIPASE, BLOOD: LIPASE: 21 U/L (ref 11–51)

## 2016-01-29 LAB — PREPARE RBC (CROSSMATCH)

## 2016-01-29 LAB — FERRITIN: Ferritin: 2 ng/mL — ABNORMAL LOW (ref 11–307)

## 2016-01-29 LAB — ABO/RH: ABO/RH(D): A POS

## 2016-01-29 MED ORDER — IOPAMIDOL (ISOVUE-300) INJECTION 61%
100.0000 mL | Freq: Once | INTRAVENOUS | Status: AC | PRN
  Administered 2016-01-29: 100 mL via INTRAVENOUS
  Filled 2016-01-29: qty 100

## 2016-01-29 MED ORDER — MORPHINE SULFATE (PF) 4 MG/ML IV SOLN
4.0000 mg | Freq: Once | INTRAVENOUS | Status: AC
  Administered 2016-01-29: 4 mg via INTRAVENOUS

## 2016-01-29 MED ORDER — MORPHINE SULFATE (PF) 4 MG/ML IV SOLN
INTRAVENOUS | Status: AC
  Administered 2016-01-29: 4 mg via INTRAVENOUS
  Filled 2016-01-29: qty 1

## 2016-01-29 MED ORDER — ONDANSETRON HCL 4 MG/2ML IJ SOLN
4.0000 mg | Freq: Once | INTRAMUSCULAR | Status: AC
Start: 2016-01-29 — End: 2016-01-29
  Administered 2016-01-29: 4 mg via INTRAVENOUS

## 2016-01-29 MED ORDER — SODIUM CHLORIDE 0.9 % IV SOLN: 10.0000 mL/h | Freq: Once | INTRAVENOUS | Status: DC

## 2016-01-29 MED ORDER — ONDANSETRON HCL 4 MG/2ML IJ SOLN
INTRAMUSCULAR | Status: AC
  Administered 2016-01-29: 4 mg via INTRAVENOUS
  Filled 2016-01-29: qty 2

## 2016-01-29 NOTE — ED Notes (Signed)
No evidence of transfusion reaction at this time. Pt reporting only discomfort is abd pain. MD made aware.

## 2016-01-29 NOTE — ED Notes (Signed)
POC urine preg negative 

## 2016-01-29 NOTE — ED Provider Notes (Signed)
[facility] Emergency Department Provider Note  ____________________________________________  Time seen: Approximately 7:54 PM  I have reviewed the triage vital signs and the nursing notes.   HISTORY  Chief Complaint Abdominal Pain    HPI Faith Ellis is a 30 y.o. female with a history of prior tubal ligation, C-section, and cholecystectomy who presents for evaluation of gradual onset of lower abdominal pain.  She reports that it started about 3 days ago in the right lower quadrant and has gradually gotten worse and now includes her entire abdomen.  She states that initially the pain was an aching pain but now it is a sharp stabbing pain throughout her entire abdomen and is constant, movement makes it worse and nothing makes it better.  It is severe in intensity.  He denies fever/chills, chest pain, shortness of breath, nausea, vomiting, diarrhea.  She has not had any vaginal bleeding recently although she has a history of heavy menstrual periods.  She has not had any dysuria or hematuria.  She denies any recent upper respiratory viral symptoms.   History reviewed. No pertinent past medical history.  Patient Active Problem List   Diagnosis Date Noted  . Anemia 01/29/2016    Past Surgical History  Procedure Laterality Date  . Cholecystectomy    . C-section      Current Outpatient Rx  Name  Route  Sig  Dispense  Refill  . Aspirin-Salicylamide-Caffeine (BC HEADACHE POWDER PO)   Oral   Take 1 packet by mouth daily as needed (takes as needed for headache.).           Allergies Keflex  No family history on file.  Social History Social History  Substance Use Topics  . Smoking status: Never Smoker   . Smokeless tobacco: None  . Alcohol Use: No    Review of Systems Constitutional: No fever/chills Eyes: No visual changes. ENT: No sore throat. Cardiovascular: Denies chest pain. Respiratory: Denies shortness of  breath. Gastrointestinal: Severe generalized abdominal pain.  No nausea, no vomiting.  No diarrhea.  No constipation. Genitourinary: Negative for dysuria and hematuria Musculoskeletal: Negative for back pain. Skin: Negative for rash. Neurological: Negative for headaches, focal weakness or numbness.  10-point ROS otherwise negative.  ____________________________________________   PHYSICAL EXAM:  VITAL SIGNS: ED Triage Vitals  Enc Vitals Group     BP 01/29/16 1727 101/51 mmHg     Pulse Rate 01/29/16 1727 97     Resp 01/29/16 1727 18     Temp 01/29/16 1727 98.1 F (36.7 C)     Temp Source 01/29/16 1727 Oral     SpO2 01/29/16 1727 98 %     Weight 01/29/16 1727 137 lb (62.143 kg)     Height 01/29/16 1727 5\' 2"  (1.575 m)     Head Cir --      Peak Flow --      Pain Score 01/29/16 1728 5     Pain Loc --      Pain Edu? --      Excl. in [place]? --     Constitutional: Alert and oriented. Ill-appearing, pale and in pain Eyes: Conjunctivae are normal. PERRL. EOMI. Head: Atraumatic. Nose: No congestion/rhinnorhea. Mouth/Throat: Mucous membranes are moist.  Oropharynx non-erythematous. Neck: No stridor.  No meningeal signs.   Cardiovascular: Normal rate, regular rhythm. Good peripheral circulation. Grossly normal heart sounds.   Respiratory: Normal respiratory effort.  No retractions. Lungs CTAB. Gastrointestinal: Soft and nontender. No distention.  Rectal:  Normal  external exam.  Non-tender digital exam with light brown stool, heme negative with QC passed. Musculoskeletal: No lower extremity tenderness nor edema. No gross deformities of extremities. Neurologic:  Normal speech and language. No gross focal neurologic deficits are appreciated.  Skin:  Skin is warm, dry and intact. No rash noted.  Very pale. Psychiatric: Mood and affect are normal. Speech and behavior are normal.  ____________________________________________   LABS (all labs ordered are listed, but only abnormal results  are displayed)  Labs Reviewed  COMPREHENSIVE METABOLIC PANEL - Abnormal; Notable for the following:    CO2 19 (*)    Glucose, Bld 123 (*)    ALT 11 (*)    Total Bilirubin 0.2 (*)    All other components within normal limits  CBC - Abnormal; Notable for the following:    Hemoglobin 5.2 (*)    HCT 20.1 (*)    MCV 50.2 (*)    MCH 13.0 (*)    MCHC 25.9 (*)    RDW 23.0 (*)    Platelets 140 (*)    All other components within normal limits  URINALYSIS COMPLETEWITH MICROSCOPIC ([facility] ONLY) - Abnormal; Notable for the following:    Color, Urine YELLOW (*)    APPearance CLEAR (*)    Squamous Epithelial / LPF 0-5 (*)    All other components within normal limits  IRON AND TIBC - Abnormal; Notable for the following:    Iron 8 (*)    TIBC 497 (*)    Saturation Ratios 2 (*)    All other components within normal limits  FERRITIN - Abnormal; Notable for the following:    Ferritin 2 (*)    All other components within normal limits  LIPASE, BLOOD  PREGNANCY, URINE  HCG, QUANTITATIVE, PREGNANCY  TRANSFERRIN  POC URINE PREG, ED  PREPARE RBC (CROSSMATCH)  TYPE AND SCREEN  ABO/RH   ____________________________________________  EKG  None ____________________________________________  RADIOLOGY   Ct Abdomen Pelvis W Contrast  01/29/2016  CLINICAL DATA:  Right lower abdominal pain beginning this Sunday, now located diffusely throughout the lower abdomen and pelvis. History of ovarian cysts. EXAM: CT ABDOMEN AND PELVIS WITH CONTRAST TECHNIQUE: Multidetector CT imaging of the abdomen and pelvis was performed using the standard protocol following bolus administration of intravenous contrast. CONTRAST:  176mL ISOVUE-300 IOPAMIDOL (ISOVUE-300) INJECTION 61% COMPARISON:  Abdominal ultrasound - 01/31/2013 FINDINGS: Lower chest: Limited visualization of lower thorax demonstrates minimal dependent subpleural ground-glass atelectasis. There is minimal subsegmental atelectasis within the imaged left  lower lobe. No focal airspace opacities. No pleural effusion. Normal heart size.  No pericardial effusion. Hepatobiliary: Normal hepatic contour. Post cholecystectomy. Common bile duct is mildly dilated, measuring approximately 0.8 cm in greatest oblique coronal diameter (coronal image 55, series 5) with associated mild centralized intrahepatic bili duct dilatation, likely secondary to post cholecystectomy state and biliary reservoir phenomenon. No ascites. Pancreas: Normal in appearance.  No pancreatic duct dilatation. Spleen: Normal in appearance. Note is made of a prominent splenule about the splenic hilum. Adrenals/Urinary Tract: There is symmetric enhancement of the bilateral kidneys. Note is made of a approximately 0.6 cm partially exophytic hypo attenuating lesion arising from the interpolar aspect the right kidney which is too small to adequately characterize of favored to represent a renal cyst. No discrete left-sided renal lesions. No renal stones. No urinary obstruction or perinephric stranding. Normal appearance of the bilateral adrenal glands. Normal appearance of the urinary bladder given degree distention. Stomach/Bowel: The bowel is normal in course and caliber  without evidence of enteric obstruction or discrete area of wall thickening. Normal appearance of the terminal ileum and appendix. No pneumoperitoneum, pneumatosis or portal venous gas. Vascular/Lymphatic: Minimal amount of eccentric calcified plaque with a normal caliber abdominal aorta. The major branch vessels of the abdominal aorta appear widely patent on this non CTA examination. Reproductive: Note is made of an approximately 3.5 x 4.0 cm hypo attenuating (18 Hounsfield unit) right-sided adnexal cyst (image 62, series 2, coronal image 79, series 5). This finding is associated with a minimal amount of presumably physiologic free fluid in the pelvic cul-de-sac. No discrete left-sided adnexal lesions. All normal appearance of the anteverted  uterus. Other: Regional soft tissues appear normal. Musculoskeletal: No acute or aggressive osseous abnormalities. IMPRESSION: 1. Note made of an approximately 4 cm right-sided presumably physiologic adnexal cyst with small amount of presumably physiologic fluid within the pelvic cul-de-sac. If there is clinical concern for ovarian torsion, further evaluation with pelvic ultrasound with Doppler could be performed as clinically indicated. Otherwise, this ovarian lesion is almost certainly benign, and no specific imaging follow up is recommended according to the Society of Radiologists in [place] (D Clovis Riley et al. Management of Asymptomatic Ovarian and Other Adnexal Cysts Imaged at [place]: Society of Radiologists in [place] 2010. Radiology 256 (Sept 2010): 943-954.). 2. Otherwise, no explanation for patient's abdominal pain. Specifically, no evidence of enteric or urinary obstruction. Normal appearance of the appendix. Electronically Signed   By: Sandi Mariscal M.D.   On: 01/29/2016 21:24    ____________________________________________   PROCEDURES  Procedure(s) performed: None  Critical Care performed: Yes, see critical care note(s)   CRITICAL CARE Performed by: Hinda Kehr   Total critical care time: 45 minutes  Critical care time was exclusive of separately billable procedures and treating other patients.  Critical care was necessary to treat or prevent imminent or life-threatening deterioration.  Critical care was time spent personally by me on the following activities: development of treatment plan with patient and/or surrogate as well as nursing, discussions with consultants, evaluation of patient's response to treatment, examination of patient, obtaining history from patient or surrogate, ordering and performing treatments and interventions, ordering and review of laboratory studies, ordering and review of radiographic  studies, pulse oximetry and re-evaluation of patient's condition.  ____________________________________________   INITIAL IMPRESSION / ASSESSMENT AND PLAN / ED COURSE  Pertinent labs & imaging results that were available during my care of the patient were reviewed by me and considered in my medical decision making (see chart for details).  8:15pm:  I saw the patient's labs most notable for a Hb of 5.2, repeated and verified by the lab.  I saw the patient immediately at that point (about 8:00pm).  She is pale but with stable vitals (not hypotensive nor tachycardiac) but ill-appearing w/ severe abd pain.  Concerned for rupture ovarian cyst leading to intraabdominal bleeding and hypovolemia and acute anemia.  Ordering emergency release blood (2 units) given the probability of active intraabdominal bleeding.  Bedside FAST performed by me did not reveal any obvious large fluid/blood pockets, and patient is hemodynamically stable, so will proceed with CT abd/pelvis with IV contrast only (do not want to wait for PO contrast). Called CT to let them know.  Point-of-care urine pregnancy test was not originally documented, but we verified with the tech who performed the test that it was negative.  I touched base with Dr. Star Age by phone and he agreed with my management plan at  this time and wants me to keep him in the loop, although he did find in prior documentation that the patient has seen Dr. Ouida Sills in the past.  ----------------------------------------- 9:00 PM on 01/29/2016 -----------------------------------------  Patient is receiving a unit of emergency release blood and going right now for CT scan.  Blood pressure and heart rate stable.   (Note that documentation was delayed due to multiple ED patients requiring immediate care.)  Patient required three rounds of morphine to control pain.  However, her CT scan was essentially unremarkable with only trace physiological fluid in the pelvic  cul-de-sac.  She has a 4 cm cyst on her ovary but it is simple in appearance and is very unlikely to be torsed based on the patient's history and signs and symptoms as well as the CT scan results.  I discussed all of this again by phone with Dr. Star Age and he agrees that pelvic ultrasound is likely unnecessary at this point, and it is reassuring that she does not have hemoperitoneum.  It therefore does not appear to be a gynecological cause leading to her anemia.  Additionally, upon review of her CBC, her MCV is 50 suggesting a chronic issue.  Given the severity of her anemia, as well as her intractable abdominal pain, I will admit her to the hospitalist for further management of both issues.  I informed the hospitalist that the patient has received one unit of emergency release blood so any iron studies or other blood work should be done as out onto the original blood.  I returned the other units of blood to the blood bank and she received only 1 unit in the emergency department.  She is feeling better and tolerating by mouth fluids at this time.   ____________________________________________  FINAL CLINICAL IMPRESSION(S) / ED DIAGNOSES  Final diagnoses:  Severe anemia  Intractable abdominal pain     MEDICATIONS GIVEN DURING THIS VISIT:  Medications  0.9 %  sodium chloride infusion (not administered)  morphine 4 MG/ML injection 4 mg (4 mg Intravenous Given 01/29/16 2020)  ondansetron (ZOFRAN) injection 4 mg (4 mg Intravenous Given 01/29/16 2020)  morphine 4 MG/ML injection 4 mg (4 mg Intravenous Given 01/29/16 2057)  iopamidol (ISOVUE-300) 61 % injection 100 mL (100 mLs Intravenous Contrast Given 01/29/16 2104)  morphine 4 MG/ML injection 4 mg (4 mg Intravenous Given 01/29/16 2221)  1 unit emergency release PRBCs   NEW OUTPATIENT MEDICATIONS STARTED DURING THIS VISIT:  New Prescriptions   No medications on file      Note:  This document was prepared using Dragon voice recognition software  and may include unintentional dictation errors.   Hinda Kehr, MD 01/29/16 2329

## 2016-01-29 NOTE — ED Notes (Signed)
Pt reports she began having rt lower abd pain Sunday, now the pain is the entire lower abd. Pt denies vaginal bleeding, denies nvd. Hx of ovarian cysts. Denies fever.

## 2016-01-30 ENCOUNTER — Encounter: Payer: Self-pay | Admitting: Internal Medicine

## 2016-01-30 LAB — HEMOGLOBIN AND HEMATOCRIT, BLOOD

## 2016-01-30 LAB — CBC
HCT: 28.1 % — ABNORMAL LOW (ref 35.0–47.0)
HEMOGLOBIN: 8.1 g/dL — AB (ref 12.0–16.0)
MCH: 17.8 pg — ABNORMAL LOW (ref 26.0–34.0)
MCHC: 29 g/dL — ABNORMAL LOW (ref 32.0–36.0)
MCV: 61.4 fL — ABNORMAL LOW (ref 80.0–100.0)
Platelets: 114 10*3/uL — ABNORMAL LOW (ref 150–440)
RBC: 4.57 MIL/uL (ref 3.80–5.20)
RDW: 37.1 % — ABNORMAL HIGH (ref 11.5–14.5)
WBC: 3.7 10*3/uL (ref 3.6–11.0)

## 2016-01-30 LAB — HEMOGLOBIN A1C: Hgb A1c MFr Bld: UNDETERMINED % (ref 4.0–6.0)

## 2016-01-30 LAB — PREPARE RBC (CROSSMATCH)

## 2016-01-30 LAB — TSH: TSH: 1.23 u[IU]/mL (ref 0.350–4.500)

## 2016-01-30 LAB — TRANSFERRIN: Transferrin: 355 mg/dL (ref 192–382)

## 2016-01-30 MED ORDER — ACETAMINOPHEN 325 MG PO TABS: 650.0000 mg | ORAL_TABLET | Freq: Four times a day (QID) | ORAL | Status: DC | PRN

## 2016-01-30 MED ORDER — SUCRALFATE 1 G PO TABS
1.0000 g | ORAL_TABLET | Freq: Three times a day (TID) | ORAL | Status: DC
  Administered 2016-01-30 – 2016-02-02 (×13): 1 g via ORAL
  Filled 2016-01-30 (×13): qty 1

## 2016-01-30 MED ORDER — DOCUSATE SODIUM 100 MG PO CAPS
100.0000 mg | ORAL_CAPSULE | Freq: Two times a day (BID) | ORAL | Status: DC
  Administered 2016-01-30 – 2016-02-02 (×8): 100 mg via ORAL
  Filled 2016-01-30 (×9): qty 1

## 2016-01-30 MED ORDER — FOLIC ACID 1 MG PO TABS
1.0000 mg | ORAL_TABLET | Freq: Every day | ORAL | Status: DC
  Administered 2016-01-30 – 2016-02-02 (×4): 1 mg via ORAL
  Filled 2016-01-30 (×4): qty 1

## 2016-01-30 MED ORDER — ONDANSETRON HCL 4 MG PO TABS: 4.0000 mg | ORAL_TABLET | Freq: Four times a day (QID) | ORAL | Status: DC | PRN

## 2016-01-30 MED ORDER — SODIUM CHLORIDE 0.9 % IV SOLN
Freq: Once | INTRAVENOUS | Status: AC
  Administered 2016-01-30: 02:00:00 via INTRAVENOUS

## 2016-01-30 MED ORDER — DEXTROSE-NACL 5-0.9 % IV SOLN
INTRAVENOUS | Status: DC
  Administered 2016-01-30 (×2): via INTRAVENOUS

## 2016-01-30 MED ORDER — ACETAMINOPHEN 650 MG RE SUPP: 650.0000 mg | Freq: Four times a day (QID) | RECTAL | Status: DC | PRN

## 2016-01-30 MED ORDER — ONDANSETRON HCL 4 MG/2ML IJ SOLN: 4.0000 mg | Freq: Four times a day (QID) | INTRAMUSCULAR | Status: DC | PRN

## 2016-01-30 MED ORDER — SODIUM CHLORIDE 0.9 % IV SOLN
INTRAVENOUS | Status: DC
  Administered 2016-01-31 (×2): via INTRAVENOUS

## 2016-01-30 MED ORDER — MORPHINE SULFATE (PF) 2 MG/ML IV SOLN
1.0000 mg | INTRAVENOUS | Status: DC | PRN
  Administered 2016-01-30 – 2016-02-01 (×10): 1 mg via INTRAVENOUS
  Filled 2016-01-30 (×10): qty 1

## 2016-01-30 MED ORDER — OXYCODONE HCL 5 MG PO TABS
5.0000 mg | ORAL_TABLET | Freq: Four times a day (QID) | ORAL | Status: DC | PRN
  Administered 2016-01-30 – 2016-02-01 (×7): 5 mg via ORAL
  Filled 2016-01-30 (×8): qty 1

## 2016-01-30 MED ORDER — PANTOPRAZOLE SODIUM 40 MG PO TBEC
40.0000 mg | DELAYED_RELEASE_TABLET | Freq: Every day | ORAL | Status: DC
  Administered 2016-01-30 – 2016-01-31 (×2): 40 mg via ORAL
  Filled 2016-01-30 (×2): qty 1

## 2016-01-30 NOTE — Progress Notes (Signed)
[facility] NAME: Faith Ellis    MR#:  [id number]  DATE OF BIRTH:  [birth year]  SUBJECTIVE:   patient doing ok this am  No dark-colored stools. Patient was having some epigastric abdominal pain at one point.  REVIEW OF SYSTEMS:    Review of Systems  Constitutional: Negative for fever, chills and malaise/fatigue.  HENT: Negative for ear discharge, ear pain, hearing loss, nosebleeds and sore throat.   Eyes: Negative for blurred vision and pain.  Respiratory: Negative for cough, hemoptysis, shortness of breath and wheezing.   Cardiovascular: Negative for chest pain, palpitations and leg swelling.  Gastrointestinal: Negative for nausea, vomiting, abdominal pain, diarrhea and blood in stool.  Genitourinary: Negative for dysuria.  Musculoskeletal: Negative for back pain.  Neurological: Negative for dizziness, tremors, speech change, focal weakness, seizures and headaches.  Endo/Heme/Allergies: Does not bruise/bleed easily.  Psychiatric/Behavioral: Negative for depression, suicidal ideas and hallucinations.    Tolerating Diet:yes      DRUG ALLERGIES:   Allergies  Allergen Reactions  . Keflex [Cephalexin] Rash    VITALS:  Blood pressure 100/50, pulse 53, temperature 98.3 F (36.8 C), temperature source Oral, resp. rate 18, height 5\' 3"  (1.6 m), weight 64.456 kg (142 lb 1.6 oz), last menstrual period 01/07/2016, SpO2 100 %.  PHYSICAL EXAMINATION:   Physical Exam    LABORATORY PANEL:   CBC  Recent Labs Lab 01/29/16 1731  WBC 6.6  HGB 5.2*  HCT 20.1*  PLT 140*   ------------------------------------------------------------------------------------------------------------------  Chemistries   Recent Labs Lab 01/29/16 1731  NA 138  K 3.8  CL 109  CO2 19*  GLUCOSE 123*  BUN 9  CREATININE 0.49  CALCIUM 9.1  AST 17  ALT 11*  ALKPHOS 81  BILITOT 0.2*    ------------------------------------------------------------------------------------------------------------------  Cardiac Enzymes No results for input(s): TROPONINI in the last 168 hours. ------------------------------------------------------------------------------------------------------------------  RADIOLOGY:  Ct Abdomen Pelvis W Contrast  01/29/2016  CLINICAL DATA:  Right lower abdominal pain beginning this Sunday, now located diffusely throughout the lower abdomen and pelvis. History of ovarian cysts. EXAM: CT ABDOMEN AND PELVIS WITH CONTRAST TECHNIQUE: Multidetector CT imaging of the abdomen and pelvis was performed using the standard protocol following bolus administration of intravenous contrast. CONTRAST:  141mL ISOVUE-300 IOPAMIDOL (ISOVUE-300) INJECTION 61% COMPARISON:  Abdominal ultrasound - 01/31/2013 FINDINGS: Lower chest: Limited visualization of lower thorax demonstrates minimal dependent subpleural ground-glass atelectasis. There is minimal subsegmental atelectasis within the imaged left lower lobe. No focal airspace opacities. No pleural effusion. Normal heart size.  No pericardial effusion. Hepatobiliary: Normal hepatic contour. Post cholecystectomy. Common bile duct is mildly dilated, measuring approximately 0.8 cm in greatest oblique coronal diameter (coronal image 55, series 5) with associated mild centralized intrahepatic bili duct dilatation, likely secondary to post cholecystectomy state and biliary reservoir phenomenon. No ascites. Pancreas: Normal in appearance.  No pancreatic duct dilatation. Spleen: Normal in appearance. Note is made of a prominent splenule about the splenic hilum. Adrenals/Urinary Tract: There is symmetric enhancement of the bilateral kidneys. Note is made of a approximately 0.6 cm partially exophytic hypo attenuating lesion arising from the interpolar aspect the right kidney which is too small to adequately characterize of favored to represent a renal  cyst. No discrete left-sided renal lesions. No renal stones. No urinary obstruction or perinephric stranding. Normal appearance of the bilateral adrenal glands. Normal appearance of the urinary bladder given degree distention. Stomach/Bowel: The bowel is normal in course and caliber without evidence of enteric  obstruction or discrete area of wall thickening. Normal appearance of the terminal ileum and appendix. No pneumoperitoneum, pneumatosis or portal venous gas. Vascular/Lymphatic: Minimal amount of eccentric calcified plaque with a normal caliber abdominal aorta. The major branch vessels of the abdominal aorta appear widely patent on this non CTA examination. Reproductive: Note is made of an approximately 3.5 x 4.0 cm hypo attenuating (18 Hounsfield unit) right-sided adnexal cyst (image 62, series 2, coronal image 79, series 5). This finding is associated with a minimal amount of presumably physiologic free fluid in the pelvic cul-de-sac. No discrete left-sided adnexal lesions. All normal appearance of the anteverted uterus. Other: Regional soft tissues appear normal. Musculoskeletal: No acute or aggressive osseous abnormalities. IMPRESSION: 1. Note made of an approximately 4 cm right-sided presumably physiologic adnexal cyst with small amount of presumably physiologic fluid within the pelvic cul-de-sac. If there is clinical concern for ovarian torsion, further evaluation with pelvic ultrasound with Doppler could be performed as clinically indicated. Otherwise, this ovarian lesion is almost certainly benign, and no specific imaging follow up is recommended according to the Society of Radiologists in [place] (D Clovis Riley et al. Management of Asymptomatic Ovarian and Other Adnexal Cysts Imaged at [place]: Society of Radiologists in [place] 2010. Radiology 256 (Sept 2010): 943-954.). 2. Otherwise, no explanation for patient's abdominal pain.  Specifically, no evidence of enteric or urinary obstruction. Normal appearance of the appendix. Electronically Signed   By: Sandi Mariscal M.D.   On: 01/29/2016 21:24     ASSESSMENT AND PLAN:   30 year old female who presented with abdominal pain and found to have significant iron deficiency anemia.  1. Significant iron deficiency anemia: Patient is status post 3 units PRBC. Anemia panel is consistent with iron deficiency likely from heavy menses with possible slow upper GI bleed. After GI evaluation, would recommend starting iron supplementation. Continue PPI. GI consult for a.m. (no consultant today) Would benefit from EGD given hx of BC powder use and midepigastric pain. CBC for a.m.  2. Abdominal pain: This is most likely due to a large ovarian cyst.      Management plans discussed with the patient and she is in agreement.  CODE STATUS: FULL  TOTAL TIME TAKING CARE OF THIS PATIENT: 30 minutes.     POSSIBLE D/C tomorrow after GI evaluation, DEPENDING ON CLINICAL CONDITION.   Arbell Wycoff M.D on 01/30/2016 at 10:43 AM  Between 7am to 6pm - Pager - [phone] After 6pm go to [email or web address] - password [email or web address]  Office  [phone]  CC: Primary care physician; No PCP Per Patient  Note: This dictation was prepared with Dragon dictation along with smaller phrase technology. Any transcriptional errors that result from this process are unintentional.

## 2016-01-30 NOTE — H&P (Signed)
Faith Ellis is an 30 y.o. female.   Chief Complaint: Abdominal pain HPI: The patient presents emergency department complaining of abdominal pain. She states that it began 3 days ago "under her right ribs" and seemed to abate until this morning when she woke up with severe right lower quadrant pain. The patient describes the pain is having in character. She admits to feeling slightly nauseous but denies vomiting or diarrhea. She also denies chest pain, shortness of breath or fever. In the emergency department, CT of the abdomen demonstrated a right ovarian cyst. The scan was reviewed by Dr. Star Age from the OB/GYN service who agrees that this is is a simple ovarian cyst that may be the source of her pain. However, more concerning is that laboratory evaluation shows severe anemia. The patient denies blood in her stool but admits to a history of very heavy periods. She also admits to taking BC powders daily. Her husband reports that lately she has taken as many as 7 packets in a 24 hour period. She received 1 unit of packed red blood cells in the emergency department before the hospitalist service was called for further management.  Past Medical History  Diagnosis Date  . Chronic headaches     Past Surgical History  Procedure Laterality Date  . Cholecystectomy    . C-section      Family History  Problem Relation Age of Onset  .     None that she is aware  Social History:  reports that she has never smoked. She does not have any smokeless tobacco history on file. She reports that she does not drink alcohol. Her drug history is not on file.  Allergies:  Allergies  Allergen Reactions  . Keflex [Cephalexin] Rash    Medications Prior to Admission  Medication Sig Dispense Refill  . Aspirin-Salicylamide-Caffeine (BC HEADACHE POWDER PO) Take 1 packet by mouth daily as needed (takes as needed for headache.).      Results for orders placed or performed during the hospital encounter of  01/29/16 (from the past 48 hour(s))  Lipase, blood     Status: None   Collection Time: 01/29/16  5:31 PM  Result Value Ref Range   Lipase 21 11 - 51 U/L  Comprehensive metabolic panel     Status: Abnormal   Collection Time: 01/29/16  5:31 PM  Result Value Ref Range   Sodium 138 135 - 145 mmol/L   Potassium 3.8 3.5 - 5.1 mmol/L   Chloride 109 101 - 111 mmol/L   CO2 19 (L) 22 - 32 mmol/L   Glucose, Bld 123 (H) 65 - 99 mg/dL   BUN 9 6 - 20 mg/dL   Creatinine, Ser 0.49 0.44 - 1.00 mg/dL   Calcium 9.1 8.9 - 10.3 mg/dL   Total Protein 7.2 6.5 - 8.1 g/dL   Albumin 4.4 3.5 - 5.0 g/dL   AST 17 15 - 41 U/L   ALT 11 (L) 14 - 54 U/L   Alkaline Phosphatase 81 38 - 126 U/L   Total Bilirubin 0.2 (L) 0.3 - 1.2 mg/dL   GFR calc non Af Amer >60 >60 mL/min   GFR calc Af Amer >60 >60 mL/min    Comment: (NOTE) The eGFR has been calculated using the CKD EPI equation. This calculation has not been validated in all clinical situations. eGFR's persistently <60 mL/min signify possible Chronic Kidney Disease.    Anion gap 10 5 - 15  CBC     Status: Abnormal  Collection Time: 01/29/16  5:31 PM  Result Value Ref Range   WBC 6.6 3.6 - 11.0 K/uL   RBC 4.01 3.80 - 5.20 MIL/uL   Hemoglobin 5.2 (L) 12.0 - 16.0 g/dL    Comment: RESULT REPEATED AND VERIFIED   HCT 20.1 (L) 35.0 - 47.0 %   MCV 50.2 (L) 80.0 - 100.0 fL   MCH 13.0 (L) 26.0 - 34.0 pg   MCHC 25.9 (L) 32.0 - 36.0 g/dL   RDW 23.0 (H) 11.5 - 14.5 %   Platelets 140 (L) 150 - 440 K/uL    Comment: PLATELET COUNT CONFIRMED BY SMEAR  Urinalysis complete, with microscopic     Status: Abnormal   Collection Time: 01/29/16  5:31 PM  Result Value Ref Range   Color, Urine YELLOW (A) YELLOW   APPearance CLEAR (A) CLEAR   Glucose, UA NEGATIVE NEGATIVE mg/dL   Bilirubin Urine NEGATIVE NEGATIVE   Ketones, ur NEGATIVE NEGATIVE mg/dL   Specific Gravity, Urine 1.010 1.005 - 1.030   Hgb urine dipstick NEGATIVE NEGATIVE   pH 6.0 5.0 - 8.0   Protein, ur  NEGATIVE NEGATIVE mg/dL   Nitrite NEGATIVE NEGATIVE   Leukocytes, UA NEGATIVE NEGATIVE   RBC / HPF 0-5 0 - 5 RBC/hpf   WBC, UA 0-5 0 - 5 WBC/hpf   Bacteria, UA NONE SEEN NONE SEEN   Squamous Epithelial / LPF 0-5 (A) NONE SEEN   Mucous PRESENT   Pregnancy, urine     Status: None   Collection Time: 01/29/16  5:31 PM  Result Value Ref Range   Preg Test, Ur NEGATIVE NEGATIVE  hCG, quantitative, pregnancy     Status: None   Collection Time: 01/29/16  5:31 PM  Result Value Ref Range   hCG, Beta Chain, Quant, S <1 <5 mIU/mL    Comment:          GEST. AGE      CONC.  (mIU/mL)   <=1 WEEK        5 - 50     2 WEEKS       50 - 500     3 WEEKS       100 - 10,000     4 WEEKS     1,000 - 30,000     5 WEEKS     3,500 - 115,000   6-8 WEEKS     12,000 - 270,000    12 WEEKS     15,000 - 220,000        FEMALE AND NON-PREGNANT FEMALE:     LESS THAN 5 mIU/mL   Iron and TIBC     Status: Abnormal   Collection Time: 01/29/16  5:31 PM  Result Value Ref Range   Iron 8 (L) 28 - 170 ug/dL   TIBC 497 (H) 250 - 450 ug/dL   Saturation Ratios 2 (L) 10.4 - 31.8 %   UIBC 489 ug/dL  Ferritin     Status: Abnormal   Collection Time: 01/29/16  5:31 PM  Result Value Ref Range   Ferritin 2 (L) 11 - 307 ng/mL  Prepare RBC     Status: None   Collection Time: 01/29/16  8:16 PM  Result Value Ref Range   Order Confirmation ORDER PROCESSED BY BLOOD BANK   Type and screen [facility]     Status: None (Preliminary result)   Collection Time: 01/29/16  8:16 PM  Result Value Ref Range   ABO/RH(D) A POS  Antibody Screen NEG    Sample Expiration 02/01/2016    Unit Number [id number]    Blood Component Type RBC LR PHER2    Unit division 00    Status of Unit REL FROM [facility]    Transfusion Status OK TO TRANSFUSE    Crossmatch Result Compatible    Unit Number [id number]    Blood Component Type RED CELLS,LR    Unit division 00    Status of Unit ISSUED    Transfusion Status OK TO TRANSFUSE     Crossmatch Result Compatible    Unit Number [id number]    Blood Component Type RED CELLS,LR    Unit division 00    Status of Unit ALLOCATED    Transfusion Status OK TO TRANSFUSE    Crossmatch Result Compatible   ABO/Rh     Status: None   Collection Time: 01/29/16  8:17 PM  Result Value Ref Range   ABO/RH(D) A POS    Ct Abdomen Pelvis W Contrast  01/29/2016  CLINICAL DATA:  Right lower abdominal pain beginning this Sunday, now located diffusely throughout the lower abdomen and pelvis. History of ovarian cysts. EXAM: CT ABDOMEN AND PELVIS WITH CONTRAST TECHNIQUE: Multidetector CT imaging of the abdomen and pelvis was performed using the standard protocol following bolus administration of intravenous contrast. CONTRAST:  140m ISOVUE-300 IOPAMIDOL (ISOVUE-300) INJECTION 61% COMPARISON:  Abdominal ultrasound - 01/31/2013 FINDINGS: Lower chest: Limited visualization of lower thorax demonstrates minimal dependent subpleural ground-glass atelectasis. There is minimal subsegmental atelectasis within the imaged left lower lobe. No focal airspace opacities. No pleural effusion. Normal heart size.  No pericardial effusion. Hepatobiliary: Normal hepatic contour. Post cholecystectomy. Common bile duct is mildly dilated, measuring approximately 0.8 cm in greatest oblique coronal diameter (coronal image 55, series 5) with associated mild centralized intrahepatic bili duct dilatation, likely secondary to post cholecystectomy state and biliary reservoir phenomenon. No ascites. Pancreas: Normal in appearance.  No pancreatic duct dilatation. Spleen: Normal in appearance. Note is made of a prominent splenule about the splenic hilum. Adrenals/Urinary Tract: There is symmetric enhancement of the bilateral kidneys. Note is made of a approximately 0.6 cm partially exophytic hypo attenuating lesion arising from the interpolar aspect the right kidney which is too small to adequately characterize of favored to represent a  renal cyst. No discrete left-sided renal lesions. No renal stones. No urinary obstruction or perinephric stranding. Normal appearance of the bilateral adrenal glands. Normal appearance of the urinary bladder given degree distention. Stomach/Bowel: The bowel is normal in course and caliber without evidence of enteric obstruction or discrete area of wall thickening. Normal appearance of the terminal ileum and appendix. No pneumoperitoneum, pneumatosis or portal venous gas. Vascular/Lymphatic: Minimal amount of eccentric calcified plaque with a normal caliber abdominal aorta. The major branch vessels of the abdominal aorta appear widely patent on this non CTA examination. Reproductive: Note is made of an approximately 3.5 x 4.0 cm hypo attenuating (18 Hounsfield unit) right-sided adnexal cyst (image 62, series 2, coronal image 79, series 5). This finding is associated with a minimal amount of presumably physiologic free fluid in the pelvic cul-de-sac. No discrete left-sided adnexal lesions. All normal appearance of the anteverted uterus. Other: Regional soft tissues appear normal. Musculoskeletal: No acute or aggressive osseous abnormalities. IMPRESSION: 1. Note made of an approximately 4 cm right-sided presumably physiologic adnexal cyst with small amount of presumably physiologic fluid within the pelvic cul-de-sac. If there is clinical concern for ovarian torsion, further evaluation with pelvic ultrasound with Doppler could be  performed as clinically indicated. Otherwise, this ovarian lesion is almost certainly benign, and no specific imaging follow up is recommended according to the Society of Radiologists in [place] (D Clovis Riley et al. Management of Asymptomatic Ovarian and Other Adnexal Cysts Imaged at [place]: Society of Radiologists in [place] 2010. Radiology 256 (Sept 2010): 943-954.). 2. Otherwise, no explanation for patient's abdominal pain.  Specifically, no evidence of enteric or urinary obstruction. Normal appearance of the appendix. Electronically Signed   By: Sandi Mariscal M.D.   On: 01/29/2016 21:24    Review of Systems  Constitutional: Negative for fever and chills.  HENT: Negative for sore throat and tinnitus.   Eyes: Negative for blurred vision and redness.  Respiratory: Negative for cough and shortness of breath.   Cardiovascular: Negative for chest pain, palpitations, orthopnea and PND.  Gastrointestinal: Positive for abdominal pain. Negative for nausea, vomiting and diarrhea.  Genitourinary: Negative for dysuria, urgency and frequency.  Musculoskeletal: Negative for myalgias and joint pain.  Skin: Negative for rash.       No lesions  Neurological: Positive for headaches. Negative for speech change, focal weakness and weakness.  Endo/Heme/Allergies: Does not bruise/bleed easily.       No temperature intolerance  Psychiatric/Behavioral: Negative for depression and suicidal ideas.    Blood pressure 116/48, pulse 63, temperature 98 F (36.7 C), temperature source Oral, resp. rate 20, height _0  (1.6 m), weight 64.048 kg (141 lb 3.2 oz), last menstrual period 01/07/2016, SpO2 100 %. Physical Exam  Vitals reviewed. Constitutional: She is oriented to person, place, and time. She appears well-developed and well-nourished. No distress.  HENT:  Head: Normocephalic and atraumatic.  Mouth/Throat: Oropharynx is clear and moist.  Eyes: Conjunctivae and EOM are normal. Pupils are equal, round, and reactive to light. No scleral icterus.  Neck: Normal range of motion. Neck supple. No JVD present. No tracheal deviation present. No thyromegaly present.  Cardiovascular: Normal rate, regular rhythm and normal heart sounds.  Exam reveals no gallop and no friction rub.   No murmur heard. Respiratory: Effort normal and breath sounds normal.  GI: Soft. Bowel sounds are normal. She exhibits no distension. There is tenderness. There is  guarding (voluntary RLQ). There is no rebound.  Genitourinary:  Deferred  Musculoskeletal: Normal range of motion. She exhibits no edema.  Lymphadenopathy:    She has no cervical adenopathy.  Neurological: She is alert and oriented to person, place, and time. No cranial nerve deficit. She exhibits normal muscle tone.  Skin: Skin is warm and dry. No rash noted. No erythema.  Psychiatric: She has a normal mood and affect. Her behavior is normal. Judgment and thought content normal.     Assessment/Plan This is a 31 year old female admitted for severe anemia and abdominal pain. 1. Anemia: Hemoglobin 5 on presentation; she is received 1 unit packed red blood cells. I will schedule 2 more units for now. Obtain iron studies on blood drawn prior to transfusion. Etiology includes chronic microscopic bleeding from the GI tract as well as heavy menstrual periods. 2. Abdominal pain: Differential diagnosis includes combination of enlarged ovarian cyst as well as gastritis secondary to aspirin-containing powder. I have consulted gastroenterology. We will Hemoccult her stool. I started the patient on Carafate. I have made her nothing by mouth in case gastroenterology service would like to perform endoscopy. 3. DVT prophylaxis: SCDs 4. GI prophylaxis: Pantoprazole for presumed microscopic gastric bleeding The patient is a full code. Time spent on admission was  inpatient care possibly 45 minutes  Harrie Foreman, MD 01/30/2016, 1:08 AM

## 2016-01-30 NOTE — Care Management (Signed)
Presented to this facility with the diagnosis of anemia. Lives with female-friend and her children in the home. Mother is Patsy Rice. [phone]). [facility]. Never been to the [facility]. Lives in [place]. Works at [employer] on [address] since January 2017. Takes care of all basic and instrumental activities of daily living herself, can drive.  Gave application to the [facility]. Will update Bonnee Quin at the [place] [facility] MS[facility] CCM Care Management [phone]

## 2016-01-30 NOTE — Plan of Care (Signed)
Problem: Fluid Volume: Goal: Ability to maintain a balanced intake and output will improve Outcome: Progressing Complaints of abdominal pain, PRN pain meds given with relief, OOB ambulating in room and to bathroom, tolerates regular diet, voices any needs

## 2016-01-31 ENCOUNTER — Inpatient Hospital Stay: Payer: Self-pay

## 2016-01-31 ENCOUNTER — Encounter: Payer: Self-pay | Admitting: Obstetrics and Gynecology

## 2016-01-31 LAB — TYPE AND SCREEN
ABO/RH(D): A POS
ANTIBODY SCREEN: NEGATIVE
UNIT DIVISION: 0
UNIT DIVISION: 0
Unit division: 0
Unit division: 0

## 2016-01-31 LAB — CBC
HCT: 26.6 % — ABNORMAL LOW (ref 35.0–47.0)
Hemoglobin: 7.8 g/dL — ABNORMAL LOW (ref 12.0–16.0)
MCH: 17.9 pg — ABNORMAL LOW (ref 26.0–34.0)
MCHC: 29.4 g/dL — ABNORMAL LOW (ref 32.0–36.0)
MCV: 60.9 fL — AB (ref 80.0–100.0)
PLATELETS: 107 10*3/uL — AB (ref 150–440)
RBC: 4.37 MIL/uL (ref 3.80–5.20)
RDW: 36.8 % — AB (ref 11.5–14.5)
WBC: 4.3 10*3/uL (ref 3.6–11.0)

## 2016-01-31 MED ORDER — NICOTINE 14 MG/24HR TD PT24
14.0000 mg | MEDICATED_PATCH | Freq: Every day | TRANSDERMAL | Status: DC
  Administered 2016-01-31: 15:00:00 14 mg via TRANSDERMAL
  Filled 2016-01-31 (×3): qty 1

## 2016-01-31 MED ORDER — SODIUM CHLORIDE 0.9 % IV SOLN
150.0000 mg | INTRAVENOUS | Status: AC
  Administered 2016-01-31 – 2016-02-01 (×2): 150 mg via INTRAVENOUS
  Filled 2016-01-31 (×2): qty 7.5

## 2016-01-31 MED ORDER — PANTOPRAZOLE SODIUM 40 MG IV SOLR
40.0000 mg | Freq: Two times a day (BID) | INTRAVENOUS | Status: AC
  Administered 2016-01-31 – 2016-02-01 (×3): 40 mg via INTRAVENOUS
  Filled 2016-01-31 (×3): qty 40

## 2016-01-31 NOTE — Plan of Care (Signed)
Pt asking to go outside for about 10 minutes.  RN informed to she could not leave floor and educated as to why?  Pt stated she left floor 4x last night??? Dr. notified and also confirmed she did not want pt leaving floor while admitted.

## 2016-01-31 NOTE — Consult Note (Signed)
GI Inpatient Consult Note  Reason for Consult: Severe Anemia   Attending Requesting Consult: Dr. Marcille Blanco  History of Present Illness: Faith Ellis is a 30 y.o. female with no significant PMH reports that she started having constant severe bilateral lower abdominal pain two days ago.  She also reports epigastric burning for the past 3-4 months.  She was found to have a hgb of 5.2 in the ED on admission.  She reports she has heavy menses.  Her TVUS showed a possible hemorrhagic ovarian cyst.  She also reports she has had significant NSAID use.  She reports using BC powders on a daily basis for the past 2-3 years for headaches.  Some days as many as 6.  She reports using Tums and Pepto with some relief of epigastric burning.  She also tries to avoid tomato based products.  She is an everyday smoker.  She denies dark stools, denies blood on tissue or in toilet.  Denies N/V.  She reports a cholecystectomy 10 years ago.  No previous endoscopies.  No family history of colon or rectal cancer.  Reports history of stomach ulcers in mother father.   Past Medical History:  Past Medical History  Diagnosis Date  . Chronic headaches     Problem List: Patient Active Problem List   Diagnosis Date Noted  . Anemia 01/29/2016    Past Surgical History: Past Surgical History  Procedure Laterality Date  . Cholecystectomy    . C-section      Allergies: Allergies  Allergen Reactions  . Keflex [Cephalexin] Rash    Home Medications: Prescriptions prior to admission  Medication Sig Dispense Refill Last Dose  . Aspirin-Salicylamide-Caffeine (BC HEADACHE POWDER PO) Take 1 packet by mouth daily as needed (takes as needed for headache.).   prn at prn   Home medication reconciliation was completed with the patient.   Scheduled Inpatient Medications:   . docusate sodium  100 mg Oral BID  . folic acid  1 mg Oral Daily  . iron sucrose  150 mg Intravenous Q24H  . nicotine  14 mg Transdermal  Daily  . pantoprazole  40 mg Oral Daily  . sucralfate  1 g Oral TID WC & HS    Continuous Inpatient Infusions:   . sodium chloride 50 mL/hr at 01/31/16 0901    PRN Inpatient Medications:  acetaminophen **OR** acetaminophen, morphine injection, ondansetron **OR** ondansetron (ZOFRAN) IV, oxyCODONE  Family History: family history is not on file.    Social History:   reports that she has never smoked. She does not have any smokeless tobacco history on file. She reports that she does not drink alcohol.   Review of Systems: Constitutional: Weight is stable.  Eyes: No changes in vision. ENT: No oral lesions, sore throat.  GI: see HPI.  Heme/Lymph: No easy bruising.  CV: No chest pain.  GU: No hematuria.  Integumentary: No rashes.  Neuro: No headaches.  Psych: No depression/anxiety.  Endocrine: No heat/cold intolerance.  Allergic/Immunologic: No urticaria.  Resp: No cough, SOB.  Musculoskeletal: No joint swelling.    Physical Examination: BP 120/48 mmHg  Pulse 55  Temp(Src) 97.7 F (36.5 C) (Oral)  Resp 20  Ht 5\' 3"  (1.6 m)  Wt 65.772 kg (145 lb)  BMI 25.69 kg/m2  SpO2 100%  LMP 01/07/2016 (Approximate) Gen: NAD, alert and oriented x 4 HEENT: PEERLA, EOMI, Neck: supple, no JVD or thyromegaly Chest: CTA bilaterally, no wheezes, crackles, or other adventitious sounds CV: RRR, no m/g/c/r Abd: soft,bilateral  lower tenderness, epigastric tenderness, ND, +BS in all four quadrants; no HSM, guarding, ridigity, or rebound tenderness Ext: no edema, well perfused with 2+ pulses, Skin: no rash or lesions noted Lymph: no LAD  Data: Lab Results  Component Value Date   WBC 4.3 01/31/2016   HGB 7.8* 01/31/2016   HCT 26.6* 01/31/2016   MCV 60.9* 01/31/2016   PLT 107* 01/31/2016    Recent Labs Lab 01/29/16 1731 01/30/16 1035 01/31/16 0433  HGB 5.2* 8.1*  TEST WILL BE CREDITED 7.8*   Lab Results  Component Value Date   NA 138 01/29/2016   K 3.8 01/29/2016   CL 109  01/29/2016   CO2 19* 01/29/2016   BUN 9 01/29/2016   CREATININE 0.49 01/29/2016   Lab Results  Component Value Date   ALT 11* 01/29/2016   AST 17 01/29/2016   ALKPHOS 81 01/29/2016   BILITOT 0.2* 01/29/2016   No results for input(s): APTT, INR, PTT in the last 168 hours.  Imaging: CLINICAL DATA: Right lower abdominal pain beginning this Sunday, now located diffusely throughout the lower abdomen and pelvis. History of ovarian cysts.  EXAM: CT ABDOMEN AND PELVIS WITH CONTRAST  TECHNIQUE: Multidetector CT imaging of the abdomen and pelvis was performed using the standard protocol following bolus administration of intravenous contrast.  CONTRAST: 120mL ISOVUE-300 IOPAMIDOL (ISOVUE-300) INJECTION 61%  COMPARISON: Abdominal ultrasound - 01/31/2013  FINDINGS: Lower chest: Limited visualization of lower thorax demonstrates minimal dependent subpleural ground-glass atelectasis. There is minimal subsegmental atelectasis within the imaged left lower lobe. No focal airspace opacities. No pleural effusion.  Normal heart size. No pericardial effusion.  Hepatobiliary: Normal hepatic contour. Post cholecystectomy. Common bile duct is mildly dilated, measuring approximately 0.8 cm in greatest oblique coronal diameter (coronal image 55, series 5) with associated mild centralized intrahepatic bili duct dilatation, likely secondary to post cholecystectomy state and biliary reservoir phenomenon. No ascites.  Pancreas: Normal in appearance. No pancreatic duct dilatation.  Spleen: Normal in appearance. Note is made of a prominent splenule about the splenic hilum.  Adrenals/Urinary Tract: There is symmetric enhancement of the bilateral kidneys. Note is made of a approximately 0.6 cm partially exophytic hypo attenuating lesion arising from the interpolar aspect the right kidney which is too small to adequately characterize of favored to represent a renal cyst. No discrete  left-sided renal lesions. No renal stones. No urinary obstruction or perinephric stranding.  Normal appearance of the bilateral adrenal glands.  Normal appearance of the urinary bladder given degree distention.  Stomach/Bowel: The bowel is normal in course and caliber without evidence of enteric obstruction or discrete area of wall thickening. Normal appearance of the terminal ileum and appendix. No pneumoperitoneum, pneumatosis or portal venous gas.  Vascular/Lymphatic: Minimal amount of eccentric calcified plaque with a normal caliber abdominal aorta. The major branch vessels of the abdominal aorta appear widely patent on this non CTA examination.  Reproductive: Note is made of an approximately 3.5 x 4.0 cm hypo attenuating (18 Hounsfield unit) right-sided adnexal cyst (image 62, series 2, coronal image 79, series 5). This finding is associated with a minimal amount of presumably physiologic free fluid in the pelvic cul-de-sac. No discrete left-sided adnexal lesions. All normal appearance of the anteverted uterus.  Other: Regional soft tissues appear normal.  Musculoskeletal: No acute or aggressive osseous abnormalities.  IMPRESSION: 1. Note made of an approximately 4 cm right-sided presumably physiologic adnexal cyst with small amount of presumably physiologic fluid within the pelvic cul-de-sac. If there is clinical concern for  ovarian torsion, further evaluation with pelvic ultrasound with Doppler could be performed as clinically indicated. Otherwise, this ovarian lesion is almost certainly benign, and no specific imaging follow up is recommended according to the Society of Radiologists in [place] (D Clovis Riley et al. Management of Asymptomatic Ovarian and Other Adnexal Cysts Imaged at [place]: Society of Radiologists in [place] 2010. Radiology 256 (Sept 2010): 943-954.). 2. Otherwise, no explanation  for patient's abdominal pain. Specifically, no evidence of enteric or urinary obstruction. Normal appearance of the appendix.   Electronically Signed  By: Sandi Mariscal M.D.  On: 01/29/2016 21:24  CLINICAL DATA: Patient with lower abdominal pain for 3 days. History of prior ovarian cyst.  EXAM: TRANSABDOMINAL AND TRANSVAGINAL ULTRASOUND OF PELVIS  DOPPLER ULTRASOUND OF OVARIES  TECHNIQUE: Both transabdominal and transvaginal ultrasound examinations of the pelvis were performed. Transabdominal technique was performed for global imaging of the pelvis including uterus, ovaries, adnexal regions, and pelvic cul-de-sac.  It was necessary to proceed with endovaginal exam following the transabdominal exam to visualize the adnexal structures. Color and duplex Doppler ultrasound was utilized to evaluate blood flow to the ovaries.  COMPARISON: CT abdomen pelvis 01/29/2016.  FINDINGS: Uterus  Measurements: 11.3 x 4.7 x 5.3 cm. No fibroids or other mass visualized.  Endometrium  Thickness: 15 mm. No focal abnormality visualized.  Right ovary  Measurements: 5.3 x 4.3 x 4.1 cm. Within the right ovary there is a 4.0 x 2.9 x 3.7 cm mixed echogenicity mass without internal color flow.  Left ovary  Measurements: 3.7 x 2.7 x 1.9 cm. Normal appearance/no adnexal mass.  Pulsed Doppler evaluation of both ovaries demonstrates normal low-resistance arterial and venous waveforms.  Other findings  Small amount of free fluid.  IMPRESSION: The right ovary is mildly enlarged and contains a mixed echogenicity predominately cystic mass without internal color flow, favored to represent a hemorrhagic cyst. The size of the ovary, and cystic lesion, place the patient at risk for intermittent torsion. Recommend continued clinical followup as well as a follow-up ultrasound in 6- 8 weeks to assess for interval evolution or resolution of probable hemorrhagic cyst right  ovary.   Electronically Signed  By: Lovey Newcomer M.D.  On: 01/31/2016 12:40  Assessment/Plan: Ms. Theroux is a 30 y.o. female with severe anemia, heme negative stool, chronic NSAID abuse, hgb on admission 5.2, 3 units of blood, currently 7.8.  BUN/creatinine WNL. Ferritin 2; iron 8; TIBC elevated.    Recommendations: We will evaluate further with EGD tomorrow.  We recommend Protonix IV BID, continuing Carafate TID, check h pylori.  Okay for clear liquids until midnight.   She should follow up in our office on discharge.  Avoid all NSAIDs.  We will continue to follow with you.  Thank you for the consult. Please call with questions or concerns.  Salvadore Farber, PA-C  I personally performed these services.

## 2016-01-31 NOTE — Consult Note (Signed)
See Faith Ellis complete consult note, pt with severe hypochromic microcytic anemia with severe iron def.  Likely due to NSAID use.  Will do EGD tomorrow

## 2016-01-31 NOTE — Consult Note (Addendum)
Obstetrics & Gynecology Consultation Note  Date of Consultation: 01/31/2016   Requesting Provider: Posey Pronto  Primary OBGYN: Last seen by [facility] 05/2013 Primary Care Provider: No PCP Per Patient  Reason for Consultation: unknown anemia  History of Present Illness: Faith Ellis is a 30 y.o. G4P4 (LMP: 4/10) with the above CC.PMHx is significant for tobacco abuse, c-section x 2 and BTL, h/o ovarian cysts. Patient came to the ER for 5/3 for abdominal pain that had started on 4/30 but wasn't bad but increased on 5/2 and caused her to come in for evaluation on 5/3. She had never had this kind of pain before. ER beta quant negative, Hgb 5.2. CT A/P with contrast showed normal appendix, 4cm right side adnexal cyst and stated that it's likely benign and no follow up needed; no free fluid seen in the abdomen. An u/s was done today and showed 4cm RO cyst (total RO size 5cm) and possible a hemorrhagic cyst. Small amount of FF in the pelvis, normal 11cm uterus and ES 5mm. Normal LO and positive AV flow seen to the b/l adnexa and ovaries.  She states the pain is located across her lower abdomen and possibly worse in the LLQ; it comes and goes, feels sharp and crampy and since being admitted has decreased in intensity.  No fevers, chills, nausea, vomiting, dysuria, hematuria, chest pain, SOB, diarrhea, constipation, blood in BMs. She was feeling tired prior to the pain episodes.   ROS: A 12-point review of systems was performed and negative, except as stated in the above HPI.  OBGYN History: As per HPI. H/o abnormal pap but pt states last one was normal and she believes it was when she was pregnant in 2014. Used OCPs very briefly after the birth of her last child to help with dysmenorrhea. She has periods that are regular and qmonth, w/o any intermenstrual bleeding. She states her bleeding isn't heavy and uses 3-4 pads per day at her max. She does note more pain with menses since her BTL. Her periods last  about 5-6d.   Past Medical History: Past Medical History  Diagnosis Date  . Chronic headaches     Past Surgical History: Past Surgical History  Procedure Laterality Date  . Cholecystectomy    . C-section    . Tubal ligation      Family History: non contributory  Social History:  Social History   Social History  . Marital Status: Single    Spouse Name: N/A  . Number of Children: N/A  . Years of Education: N/A   Occupational History  . Not on file.   Social History Main Topics  . Smoking status: Current Every Day Smoker  . Smokeless tobacco: Not on file  . Alcohol Use: No  . Drug Use: Not on file  . Sexual Activity: Not on file   Other Topics Concern  . Not on file   Social History Narrative    Allergy: Allergies  Allergen Reactions  . Keflex [Cephalexin] Rash    Current Outpatient Medications: Prescriptions prior to admission  Medication Sig Dispense Refill Last Dose  . Aspirin-Salicylamide-Caffeine (BC HEADACHE POWDER PO) Take 1 packet by mouth daily as needed (takes as needed for headache.).   prn at prn     Hospital Medications: Current Facility-Administered Medications  Medication Dose Route Frequency Provider Last Rate Last Dose  . 0.9 %  sodium chloride infusion   Intravenous Continuous Fritzi Mandes, MD 50 mL/hr at 01/31/16 1500    . acetaminophen (  TYLENOL) tablet 650 mg  650 mg Oral Q6H PRN Harrie Foreman, MD       Or  . acetaminophen (TYLENOL) suppository 650 mg  650 mg Rectal Q6H PRN Harrie Foreman, MD      . docusate sodium (COLACE) capsule 100 mg  100 mg Oral BID Harrie Foreman, MD   100 mg at 01/31/16 0902  . folic acid (FOLVITE) tablet 1 mg  1 mg Oral Daily Harrie Foreman, MD   1 mg at 01/31/16 [id number]  . iron sucrose (VENOFER) 150 mg in sodium chloride 0.9 % 150 mL IVPB  150 mg Intravenous Q24H Fritzi Mandes, MD   150 mg at 01/31/16 0858  . morphine 2 MG/ML injection 1 mg  1 mg Intravenous Q3H PRN Harrie Foreman, MD   1 mg at  01/31/16 1820  . nicotine (NICODERM CQ - dosed in mg/24 hours) patch 14 mg  14 mg Transdermal Daily Fritzi Mandes, MD   14 mg at 01/31/16 1501  . ondansetron (ZOFRAN) tablet 4 mg  4 mg Oral Q6H PRN Harrie Foreman, MD       Or  . ondansetron [facility]) injection 4 mg  4 mg Intravenous Q6H PRN Harrie Foreman, MD      . oxyCODONE (Oxy IR/ROXICODONE) immediate release tablet 5 mg  5 mg Oral Q6H PRN Harrie Foreman, MD   5 mg at 01/31/16 1432  . pantoprazole (PROTONIX) injection 40 mg  40 mg Intravenous Q12H Cari Richards, PA-C      . sucralfate (CARAFATE) tablet 1 g  1 g Oral TID WC & HS Harrie Foreman, MD   1 g at 01/31/16 1815     Physical Exam: Patient Vitals for the past 24 hrs:  BP Temp Temp src Pulse Resp SpO2 Weight  01/31/16 1242 (!) 120/48 mmHg 97.7 F (36.5 C) Oral (!) 55 - 100 % -  01/31/16 0812 (!) 117/45 mmHg 98.3 F (36.8 C) - 66 - 100 % -  01/31/16 0545 (!) 106/44 mmHg - - (!) 59 - - -  01/31/16 0544 (!) 102/38 mmHg - - (!) 58 - - -  01/31/16 0527 (!) 99/43 mmHg - - 66 - - 145 lb (65.772 kg)  01/31/16 0524 (!) 88/40 mmHg 97.9 F (36.6 C) Oral (!) 55 20 100 % -  01/30/16 2115 (!) 102/45 mmHg 98.3 F (36.8 C) Oral 60 20 100 % -    Body mass index is 25.69 kg/(m^2). General appearance: Well nourished, well developed female in no acute distress.  Neck:  Supple, normal appearance, and no thyromegaly  Cardiovascular: S1, S2 normal, no murmur, rub or gallop, regular rate and rhythm Respiratory:  Clear to auscultation bilateral. Normal respiratory effort Abdomen: positive bowel sounds and no masses, hernias; diffusely non tender to palpation, non distended Neuro/Psych:  Normal mood and affect.  Skin:  Warm and dry.  Lymphatic:  No inguinal lymphadenopathy.   Pelvic exam: deferred. No blood seenon GU exam   Laboratory:  Recent Labs Lab 01/29/16 1731 01/30/16 1035 01/31/16 0433  WBC 6.6 3.7 4.3  HGB 5.2* 8.1*  TEST WILL BE CREDITED 7.8*  HCT 20.1* 28.1*  TEST  WILL BE CREDITED 26.6*  PLT 140* 114* 107*    Recent Labs Lab 01/29/16 1731  NA 138  K 3.8  CL 109  CO2 19*  BUN 9  CREATININE 0.49  CALCIUM 9.1  PROT 7.2  BILITOT 0.2*  ALKPHOS 81  ALT 11*  AST 17  GLUCOSE 123*   No results for input(s): APTT, INR, PTT in the last 168 hours.  Invalid input(s): DRHAPTT  Recent Labs Lab 01/29/16 2017  [place] A POS   Labs c/w iron deficiency anemia (no b12 or folate seen).  Normal TSH Imaging:  As above TECHNIQUE: Both transabdominal and transvaginal ultrasound examinations of the pelvis were performed. Transabdominal technique was performed for global imaging of the pelvis including uterus, ovaries, adnexal regions, and pelvic cul-de-sac.  It was necessary to proceed with endovaginal exam following the transabdominal exam to visualize the adnexal structures. Color and duplex Doppler ultrasound was utilized to evaluate blood flow to the ovaries.  COMPARISON: CT abdomen pelvis 01/29/2016.  FINDINGS: Uterus  Measurements: 11.3 x 4.7 x 5.3 cm. No fibroids or other mass visualized.  Endometrium  Thickness: 15 mm. No focal abnormality visualized.  Right ovary  Measurements: 5.3 x 4.3 x 4.1 cm. Within the right ovary there is a 4.0 x 2.9 x 3.7 cm mixed echogenicity mass without internal color flow.  Left ovary  Measurements: 3.7 x 2.7 x 1.9 cm. Normal appearance/no adnexal mass.  Pulsed Doppler evaluation of both ovaries demonstrates normal low-resistance arterial and venous waveforms.  Other findings  Small amount of free fluid.  IMPRESSION: The right ovary is mildly enlarged and contains a mixed echogenicity predominately cystic mass without internal color flow, favored to represent a hemorrhagic cyst. The size of the ovary, and cystic lesion, place the patient at risk for intermittent torsion. Recommend continued clinical followup as well as a follow-up ultrasound in 6- 8 weeks to assess for  interval evolution or resolution of probable hemorrhagic cyst right ovary.  Assessment: Faith Ellis is a 30 y.o. with dysmenorrhea  Plan: I told her that based on the u/s findings today that she does need a rpt u/s in 6-8wks and an updated pap smear, but that I highly doubt that there's any GYN intra-abdominal process causing her to be anemic, such as a hemorrhagic cyst, as there would've been a large amount of FF in her abdomen to drop her Hgb that much, as was seen on admission. Also, her H/H has risen appropriately after 3 units of PRBCs, so if there is any bleeding, it's stable and likely not ongoing or going at a very slow rate, which wouldn't fit a hemorrhagic ovarian cyst. If she is having menorrhagia, despite the amount of pad use she endorses, I told her that It would take a long time to bleed down to that Hgb and she has never come to the ER for menorrhagia. Since her periods are regular and predictable, she's likely due for one in about 10d and there's no intervention that couldn't wait and be considered when she's seen as an outpatient.  I don't see a FOBT result, so I'd recommend that. I'd also recommend to get a [id number] and folic acid level and consider getting a peripheral smear as an outpatient, as I'm not sure how accurate they'd be after transfusion. I also don't see any coags and would consider this as well.   Patient amenable to outpatient follow up sometime in 10d after her hospital discharge.   Total time taking care of the patient was 30 minutes, with greater than 50% of the time spent in face to face interaction with the patient.  Durene Romans MD [facility] OBGYN Pager [phone]

## 2016-01-31 NOTE — Progress Notes (Signed)
Care of patient assumed 7pm - 11pm. Patient complained of lower abdominal pain - medicated with analgesia. Otherwise assessment is WNL and VS WNL. Up and about in room. IV fluids at 27ml/hr. Family at bedside. No further complaints - no distress noted.

## 2016-01-31 NOTE — Progress Notes (Signed)
[facility] NAME: Faith Ellis    MR#:  [id number]  DATE OF BIRTH:  31-11-1985  SUBJECTIVE:   patient doing ok this am  No dark-colored stools. Patient was having some epigastric abdominal pain when eats spicy or citrus food Now has lower abd pain  REVIEW OF SYSTEMS:    Review of Systems  Constitutional: Negative for fever, chills and malaise/fatigue.  HENT: Negative for ear discharge, ear pain, hearing loss, nosebleeds and sore throat.   Eyes: Negative for blurred vision and pain.  Respiratory: Negative for cough, hemoptysis, shortness of breath and wheezing.   Cardiovascular: Negative for chest pain, palpitations and leg swelling.  Gastrointestinal: Negative for nausea, vomiting, abdominal pain, diarrhea and blood in stool.  Genitourinary: Negative for dysuria.  Musculoskeletal: Negative for back pain.  Neurological: Negative for dizziness, tremors, speech change, focal weakness, seizures and headaches.  Endo/Heme/Allergies: Does not bruise/bleed easily.  Psychiatric/Behavioral: Negative for depression, suicidal ideas and hallucinations.    Tolerating Diet:yes  DRUG ALLERGIES:   Allergies  Allergen Reactions  . Keflex [Cephalexin] Rash    VITALS:  Blood pressure 106/44, pulse 59, temperature 97.9 F (36.6 C), temperature source Oral, resp. rate 20, height 5\' 3"  (1.6 m), weight 65.772 kg (145 lb), last menstrual period 01/07/2016, SpO2 100 %.  PHYSICAL EXAMINATION:   Physical Exam  Constitutional: She is oriented to person, place, and time and well-developed, well-nourished, and in no distress.  HENT:  Head: Normocephalic and atraumatic.  Mouth/Throat: Oropharynx is clear and moist.  Eyes: Conjunctivae and EOM are normal. Pupils are equal, round, and reactive to light.  Neck: Normal range of motion. Neck supple. No thyromegaly present.  Cardiovascular: Normal rate, regular rhythm, normal heart sounds and intact  distal pulses.   Pulmonary/Chest: Effort normal and breath sounds normal. She has no wheezes.  Abdominal: Soft. Bowel sounds are normal. There is tenderness. There is no rebound and no guarding.  Musculoskeletal: Normal range of motion.  Neurological: She is alert and oriented to person, place, and time. She has normal reflexes. Gait normal.  Skin: Skin is warm and dry. There is pallor.  Psychiatric: Memory, affect and judgment normal.     LABORATORY PANEL:   CBC  Recent Labs Lab 01/31/16 0433  WBC 4.3  HGB 7.8*  HCT 26.6*  PLT 107*   ------------------------------------------------------------------------------------------------------------------  Chemistries   Recent Labs Lab 01/29/16 1731  NA 138  K 3.8  CL 109  CO2 19*  GLUCOSE 123*  BUN 9  CREATININE 0.49  CALCIUM 9.1  AST 17  ALT 11*  ALKPHOS 81  BILITOT 0.2*   ------------------------------------------------------------------------------------------------------------------  Cardiac Enzymes No results for input(s): TROPONINI in the last 168 hours. ------------------------------------------------------------------------------------------------------------------  RADIOLOGY:  Ct Abdomen Pelvis W Contrast  01/29/2016  CLINICAL DATA:  Right lower abdominal pain beginning this Sunday, now located diffusely throughout the lower abdomen and pelvis. History of ovarian cysts. EXAM: CT ABDOMEN AND PELVIS WITH CONTRAST TECHNIQUE: Multidetector CT imaging of the abdomen and pelvis was performed using the standard protocol following bolus administration of intravenous contrast. CONTRAST:  155mL ISOVUE-300 IOPAMIDOL (ISOVUE-300) INJECTION 61% COMPARISON:  Abdominal ultrasound - 01/31/2013 FINDINGS: Lower chest: Limited visualization of lower thorax demonstrates minimal dependent subpleural ground-glass atelectasis. There is minimal subsegmental atelectasis within the imaged left lower lobe. No focal airspace opacities. No  pleural effusion. Normal heart size.  No pericardial effusion. Hepatobiliary: Normal hepatic contour. Post cholecystectomy. Common bile duct is mildly dilated, measuring approximately  0.8 cm in greatest oblique coronal diameter (coronal image 55, series 5) with associated mild centralized intrahepatic bili duct dilatation, likely secondary to post cholecystectomy state and biliary reservoir phenomenon. No ascites. Pancreas: Normal in appearance.  No pancreatic duct dilatation. Spleen: Normal in appearance. Note is made of a prominent splenule about the splenic hilum. Adrenals/Urinary Tract: There is symmetric enhancement of the bilateral kidneys. Note is made of a approximately 0.6 cm partially exophytic hypo attenuating lesion arising from the interpolar aspect the right kidney which is too small to adequately characterize of favored to represent a renal cyst. No discrete left-sided renal lesions. No renal stones. No urinary obstruction or perinephric stranding. Normal appearance of the bilateral adrenal glands. Normal appearance of the urinary bladder given degree distention. Stomach/Bowel: The bowel is normal in course and caliber without evidence of enteric obstruction or discrete area of wall thickening. Normal appearance of the terminal ileum and appendix. No pneumoperitoneum, pneumatosis or portal venous gas. Vascular/Lymphatic: Minimal amount of eccentric calcified plaque with a normal caliber abdominal aorta. The major branch vessels of the abdominal aorta appear widely patent on this non CTA examination. Reproductive: Note is made of an approximately 3.5 x 4.0 cm hypo attenuating (18 Hounsfield unit) right-sided adnexal cyst (image 62, series 2, coronal image 79, series 5). This finding is associated with a minimal amount of presumably physiologic free fluid in the pelvic cul-de-sac. No discrete left-sided adnexal lesions. All normal appearance of the anteverted uterus. Other: Regional soft tissues appear  normal. Musculoskeletal: No acute or aggressive osseous abnormalities. IMPRESSION: 1. Note made of an approximately 4 cm right-sided presumably physiologic adnexal cyst with small amount of presumably physiologic fluid within the pelvic cul-de-sac. If there is clinical concern for ovarian torsion, further evaluation with pelvic ultrasound with Doppler could be performed as clinically indicated. Otherwise, this ovarian lesion is almost certainly benign, and no specific imaging follow up is recommended according to the Society of Radiologists in [place] (D Clovis Riley et al. Management of Asymptomatic Ovarian and Other Adnexal Cysts Imaged at [place]: Society of Radiologists in [place] 2010. Radiology 256 (Sept 2010): 943-954.). 2. Otherwise, no explanation for patient's abdominal pain. Specifically, no evidence of enteric or urinary obstruction. Normal appearance of the appendix. Electronically Signed   By: Sandi Mariscal M.D.   On: 01/29/2016 21:24     ASSESSMENT AND PLAN:   30 year old female who presented with abdominal pain and found to have significant iron deficiency anemia.  1. Significant iron deficiency anemia: Patient is status post 3 units PRBC. -5.2--8.6--7.6 Anemia panel is consistent with iron deficiency likely from heavy menses with possible slow upper GI bleed. After GI evaluation, would recommend starting iron supplementation. Continue PPI. GI consult for today (no consultantyday) Would benefit from EGD given hx of BC powder use and midepigastric pain. -IV venofer today. Pt will f/u hematology as out pt  2. Abdominal pain: This is most likely due to a large ovarian cyst. -GYN consult for abnormal CT abd and heavy periods  3. DVT prophylaxis SCD  Management plans discussed with the patient and she is in agreement.  CODE STATUS: FULL  TOTAL TIME TAKING CARE OF THIS PATIENT: 30 minutes.    Marlow Hendrie M.D on 01/31/2016  at 7:51 AM  Between 7am to 6pm - Pager - [phone] After 6pm go to [email or web address] - password [email or web address] [facility]  Office  [phone]  CC: Primary care physician; No PCP Per Patient  Note: This  dictation was prepared with Dragon dictation along with smaller phrase technology. Any transcriptional errors that result from this process are unintentional.

## 2016-02-01 ENCOUNTER — Inpatient Hospital Stay: Payer: Self-pay | Admitting: Anesthesiology

## 2016-02-01 ENCOUNTER — Inpatient Hospital Stay: Payer: MEDICAID

## 2016-02-01 ENCOUNTER — Encounter
Admission: EM | Disposition: A | Discharge status: Home / Self Care | Payer: Self-pay | Source: Home / Self Care | Attending: Internal Medicine

## 2016-02-01 DIAGNOSIS — G44229 Chronic tension-type headache, not intractable: Secondary | ICD-10-CM

## 2016-02-01 HISTORY — PX: ESOPHAGOGASTRODUODENOSCOPY: SHX5428

## 2016-02-01 LAB — MAGNESIUM: Magnesium: 2 mg/dL (ref 1.7–2.4)

## 2016-02-01 SURGERY — EGD (ESOPHAGOGASTRODUODENOSCOPY)
Anesthesia: General

## 2016-02-01 MED ORDER — SODIUM CHLORIDE 0.9 % IV SOLN: INTRAVENOUS | Status: DC

## 2016-02-01 MED ORDER — LIDOCAINE HCL (PF) 2 % IJ SOLN
INTRAMUSCULAR | Status: DC | PRN
  Administered 2016-02-01: 50 mg

## 2016-02-01 MED ORDER — PROPOFOL 500 MG/50ML IV EMUL
INTRAVENOUS | Status: DC | PRN
  Administered 2016-02-01: 100 ug/kg/min via INTRAVENOUS

## 2016-02-01 MED ORDER — IPRATROPIUM-ALBUTEROL 0.5-2.5 (3) MG/3ML IN SOLN
RESPIRATORY_TRACT | Status: AC
  Filled 2016-02-01: qty 3

## 2016-02-01 MED ORDER — IPRATROPIUM-ALBUTEROL 0.5-2.5 (3) MG/3ML IN SOLN
3.0000 mL | Freq: Once | RESPIRATORY_TRACT | Status: AC
  Administered 2016-02-01: 3 mL via RESPIRATORY_TRACT

## 2016-02-01 MED ORDER — FERROUS SULFATE 325 (65 FE) MG PO TABS
325.0000 mg | ORAL_TABLET | Freq: Three times a day (TID) | ORAL | Status: DC
  Administered 2016-02-02 (×2): 325 mg via ORAL
  Filled 2016-02-01 (×2): qty 1

## 2016-02-01 MED ORDER — PROPOFOL 10 MG/ML IV BOLUS
INTRAVENOUS | Status: DC | PRN
  Administered 2016-02-01: 50 mg via INTRAVENOUS

## 2016-02-01 MED ORDER — MIDAZOLAM HCL 5 MG/5ML IJ SOLN
INTRAMUSCULAR | Status: DC | PRN
  Administered 2016-02-01: 2 mg via INTRAVENOUS

## 2016-02-01 MED ORDER — PANTOPRAZOLE SODIUM 40 MG PO TBEC
40.0000 mg | DELAYED_RELEASE_TABLET | Freq: Two times a day (BID) | ORAL | Status: DC
  Administered 2016-02-02: 40 mg via ORAL
  Filled 2016-02-01: qty 1

## 2016-02-01 MED ORDER — AMITRIPTYLINE HCL 50 MG PO TABS
25.0000 mg | ORAL_TABLET | Freq: Every day | ORAL | Status: DC
  Administered 2016-02-01: 21:00:00 25 mg via ORAL
  Filled 2016-02-01: qty 1

## 2016-02-01 MED ORDER — FENTANYL CITRATE (PF) 100 MCG/2ML IJ SOLN
INTRAMUSCULAR | Status: DC | PRN
  Administered 2016-02-01: 50 ug via INTRAVENOUS

## 2016-02-01 MED ORDER — TRAMADOL-ACETAMINOPHEN 37.5-325 MG PO TABS
1.0000 | ORAL_TABLET | Freq: Three times a day (TID) | ORAL | Status: DC | PRN
  Administered 2016-02-01 – 2016-02-02 (×3): 2 via ORAL
  Filled 2016-02-01 (×3): qty 2

## 2016-02-01 MED ORDER — GLYCOPYRROLATE 0.2 MG/ML IJ SOLN
INTRAMUSCULAR | Status: DC | PRN
  Administered 2016-02-01: 0.2 mg via INTRAVENOUS

## 2016-02-01 MED ORDER — GADOBENATE DIMEGLUMINE 529 MG/ML IV SOLN
15.0000 mL | Freq: Once | INTRAVENOUS | Status: AC | PRN
  Administered 2016-02-01: 15:00:00 14 mL via INTRAVENOUS

## 2016-02-01 NOTE — Anesthesia Postprocedure Evaluation (Signed)
Anesthesia Post Note  Patient: Faith Ellis  Procedure(s) Performed: Procedure(s) (LRB): ESOPHAGOGASTRODUODENOSCOPY (EGD) (N/A)  Patient location during evaluation: Endoscopy Anesthesia Type: General Level of consciousness: awake and alert Pain management: pain level controlled Vital Signs Assessment: post-procedure vital signs reviewed and stable Respiratory status: spontaneous breathing, nonlabored ventilation, respiratory function stable and patient connected to nasal cannula oxygen Cardiovascular status: blood pressure returned to baseline and stable Postop Assessment: no signs of nausea or vomiting Anesthetic complications: no    Last Vitals:  Filed Vitals:   02/01/16 0901 02/01/16 0920  BP: 90/38 91/45  Pulse: 72   Temp: 36.4 C   Resp: 15     Last Pain:  Filed Vitals:   02/01/16 0933  PainSc: [place]

## 2016-02-01 NOTE — Consult Note (Signed)
Patient with ulcer in distal antrum lesser curve, very small amt of blood, likely not be any serious bleeding. She has likely lost blood slowly over long period of time due to NSAID.  I recommend she stay in hospital today on clear liq diet and have a neurology consult for her severe headaches which are the root cause for her problems.  Advance to full liquids tomorrow and monitor hgb and maybe home Sunday.  Will contact Hospitalist.

## 2016-02-01 NOTE — Consult Note (Signed)
Reason for Consult:Headache Referring Physician: Posey Pronto  CC: Headache  HPI: Faith Ellis is an 30 y.o. female who reports that for the past 2-3 years she has had headaches.  Describes the headaches as throbbing. May occur in many areas including frontal, over the eyes or over the left or right temporal region.  Occur about 5 days a week with the more severe headaches occurring about 3 days a week.  The more severe headaches are associated with nausea, photophobia, phonophobia.  Headaches are relieved with BC powders but recur. She has no associated visual complaints.    Past Medical History  Diagnosis Date  . Chronic headaches     Past Surgical History  Procedure Laterality Date  . Cholecystectomy    . C-section    . Tubal ligation      Family History  Problem Relation Age of Onset  .       Paternal grandmother and great grandmother with brain tumors.  Mother and father with frequent headaches.    Social History:  reports that she has been smoking.  She does not have any smokeless tobacco history on file. She reports that she does not drink alcohol. Her drug history is not on file.  Allergies  Allergen Reactions  . Keflex [Cephalexin] Rash    Medications:  I have reviewed the patient's current medications. Prior to Admission:  Prescriptions prior to admission  Medication Sig Dispense Refill Last Dose  . Aspirin-Salicylamide-Caffeine (BC HEADACHE POWDER PO) Take 1 packet by mouth daily as needed (takes as needed for headache.).   prn at prn   Scheduled: . docusate sodium  100 mg Oral BID  . [START ON 02/02/2016] ferrous sulfate  325 mg Oral TID WC  . folic acid  1 mg Oral Daily  . ipratropium-albuterol      . nicotine  14 mg Transdermal Daily  . [START ON 02/02/2016] pantoprazole  40 mg Oral BID AC  . pantoprazole (PROTONIX) IV  40 mg Intravenous Q12H  . sucralfate  1 g Oral TID WC & HS    ROS: History obtained from the patient  General ROS: negative for - chills,  fatigue, fever, night sweats, weight gain or weight loss Psychological ROS: negative for - behavioral disorder, hallucinations, memory difficulties, mood swings or suicidal ideation Ophthalmic ROS: negative for - blurry vision, double vision, eye pain or loss of vision ENT ROS: negative for - epistaxis, nasal discharge, oral lesions, sore throat, tinnitus or vertigo Allergy and Immunology ROS: negative for - hives or itchy/watery eyes Hematological and Lymphatic ROS: negative for - bleeding problems, bruising or swollen lymph nodes Endocrine ROS: negative for - galactorrhea, hair pattern changes, polydipsia/polyuria or temperature intolerance Respiratory ROS: negative for - cough, hemoptysis, shortness of breath or wheezing Cardiovascular ROS: negative for - chest pain, dyspnea on exertion, edema or irregular heartbeat Gastrointestinal ROS: abdominal pain, nausea Genito-Urinary ROS: negative for - dysuria, hematuria, incontinence or urinary frequency/urgency Musculoskeletal ROS: negative for - joint swelling or muscular weakness Neurological ROS: as noted in HPI Dermatological ROS: negative for rash and skin lesion changes  Physical Examination: Blood pressure 91/45, pulse 72, temperature 97.5 F (36.4 C), temperature source Axillary, resp. rate 15, height 5\' 3"  (1.6 m), weight 66.361 kg (146 lb 4.8 oz), last menstrual period 01/07/2016, SpO2 99 %.  HEENT-  Normocephalic, no lesions, without obvious abnormality.  Normal external eye and conjunctiva.  Normal TM's bilaterally.  Normal auditory canals and external ears. Normal external nose, mucus membranes and  septum.  Normal pharynx. Cardiovascular- S1, S2 normal, pulses palpable throughout   Lungs- chest clear, no wheezing, rales, normal symmetric air entry Abdomen- soft, non-tender; bowel sounds normal; no masses,  no organomegaly Extremities- no edema Lymph-no adenopathy palpable Musculoskeletal-no joint tenderness, deformity or  swelling Skin-warm and dry, no hyperpigmentation, vitiligo, or suspicious lesions  Neurological Examination Mental Status: Alert, oriented, thought content appropriate.  Speech fluent without evidence of aphasia.  Able to follow 3 step commands without difficulty. Cranial Nerves: II: Discs flat bilaterally; Visual fields grossly normal, pupils equal, round, reactive to light and accommodation III,IV, VI: ptosis not present, extra-ocular motions intact bilaterally V,VII: smile symmetric, facial light touch sensation normal bilaterally VIII: hearing normal bilaterally IX,X: gag reflex present XI: bilateral shoulder shrug XII: midline tongue extension Motor: Right : Upper extremity   5/5    Left:     Upper extremity   5/5  Lower extremity   5/5     Lower extremity   5/5 Tone and bulk:normal tone throughout; no atrophy noted Sensory: Pinprick and light touch intact throughout, bilaterally Deep Tendon Reflexes: 2+ and symmetric throughout Plantars: Right: downgoing   Left: downgoing Cerebellar: normal finger-to-nose, normal rapid alternating movements and normal heel-to-shin test Gait: normal gait and station      Laboratory Studies:   Basic Metabolic Panel:  Recent Labs Lab 01/29/16 1731  NA 138  K 3.8  CL 109  CO2 19*  GLUCOSE 123*  BUN 9  CREATININE 0.49  CALCIUM 9.1    Liver Function Tests:  Recent Labs Lab 01/29/16 1731  AST 17  ALT 11*  ALKPHOS 81  BILITOT 0.2*  PROT 7.2  ALBUMIN 4.4    Recent Labs Lab 01/29/16 1731  LIPASE 21   No results for input(s): AMMONIA in the last 168 hours.  CBC:  Recent Labs Lab 01/29/16 1731 01/30/16 1035 01/31/16 0433  WBC 6.6 3.7 4.3  HGB 5.2* 8.1*  TEST WILL BE CREDITED 7.8*  HCT 20.1* 28.1*  TEST WILL BE CREDITED 26.6*  MCV 50.2* 61.4* 60.9*  PLT 140* 114* 107*    Cardiac Enzymes: No results for input(s): CKTOTAL, CKMB, CKMBINDEX, TROPONINI in the last 168 hours.  BNP: Invalid input(s):  POCBNP  CBG: No results for input(s): GLUCAP in the last 168 hours.  Microbiology: Results for orders placed or performed in visit on 09/09/14  Urine culture     Status: None   Collection Time: 09/09/14  8:13 PM  Result Value Ref Range Status   Micro Text Report   Final       SOURCE: CLEAN CATCH    COMMENT                   MIXED BACTERIAL ORGANISMS   COMMENT                   RESULTS SUGGESTIVE OF CONTAMINATION   ANTIBIOTIC                                                        Coagulation Studies: No results for input(s): LABPROT, INR in the last 72 hours.  Urinalysis:  Recent Labs Lab 01/29/16 1731  COLORURINE YELLOW*  LABSPEC 1.010  PHURINE 6.0  GLUCOSEU NEGATIVE  HGBUR NEGATIVE  BILIRUBINUR NEGATIVE  KETONESUR NEGATIVE  PROTEINUR NEGATIVE  NITRITE NEGATIVE  LEUKOCYTESUR NEGATIVE    Lipid Panel:  No results found for: CHOL, TRIG, HDL, CHOLHDL, VLDL, LDLCALC  HgbA1C:  Lab Results  Component Value Date   HGBA1C UNABLE TO REPORT DUE TO LOW HEMOGLOBIN 01/29/2016    Urine Drug Screen:  No results found for: LABOPIA, COCAINSCRNUR, LABBENZ, AMPHETMU, THCU, LABBARB  Alcohol Level: No results for input(s): ETH in the last 168 hours.   Imaging: US Transvaginal Non-ob  01/31/2016  CLINICAL DATA:  Patient with lower abdominal pain for 3 days. History of prior ovarian cyst. EXAM: TRANSABDOMINAL AND TRANSVAGINAL ULTRASOUND OF PELVIS DOPPLER ULTRASOUND OF OVARIES TECHNIQUE: Both transabdominal and transvaginal ultrasound examinations of the pelvis were performed. Transabdominal technique was performed for global imaging of the pelvis including uterus, ovaries, adnexal regions, and pelvic cul-de-sac. It was necessary to proceed with endovaginal exam following the transabdominal exam to visualize the adnexal structures. Color and duplex Doppler ultrasound was utilized to evaluate blood flow to the ovaries. COMPARISON:  CT abdomen pelvis 01/29/2016. FINDINGS: Uterus  Measurements: 11.3 x 4.7 x 5.3 cm. No fibroids or other mass visualized. Endometrium Thickness: 15 mm.  No focal abnormality visualized. Right ovary Measurements: 5.3 x 4.3 x 4.1 cm. Within the right ovary there is a 4.0 x 2.9 x 3.7 cm mixed echogenicity mass without internal color flow. Left ovary Measurements: 3.7 x 2.7 x 1.9 cm. Normal appearance/no adnexal mass. Pulsed Doppler evaluation of both ovaries demonstrates normal low-resistance arterial and venous waveforms. Other findings Small amount of free fluid. IMPRESSION: The right ovary is mildly enlarged and contains a mixed echogenicity predominately cystic mass without internal color flow, favored to represent a hemorrhagic cyst. The size of the ovary, and cystic lesion, place the patient at risk for intermittent torsion. Recommend continued clinical followup as well as a follow-up ultrasound in 6- 8 weeks to assess for interval evolution or resolution of probable hemorrhagic cyst right ovary. Electronically Signed   By: Lovey Newcomer M.D.   On: 01/31/2016 12:40   US Pelvis Complete  01/31/2016  CLINICAL DATA:  Patient with lower abdominal pain for 3 days. History of prior ovarian cyst. EXAM: TRANSABDOMINAL AND TRANSVAGINAL ULTRASOUND OF PELVIS DOPPLER ULTRASOUND OF OVARIES TECHNIQUE: Both transabdominal and transvaginal ultrasound examinations of the pelvis were performed. Transabdominal technique was performed for global imaging of the pelvis including uterus, ovaries, adnexal regions, and pelvic cul-de-sac. It was necessary to proceed with endovaginal exam following the transabdominal exam to visualize the adnexal structures. Color and duplex Doppler ultrasound was utilized to evaluate blood flow to the ovaries. COMPARISON:  CT abdomen pelvis 01/29/2016. FINDINGS: Uterus Measurements: 11.3 x 4.7 x 5.3 cm. No fibroids or other mass visualized. Endometrium Thickness: 15 mm.  No focal abnormality visualized. Right ovary Measurements: 5.3 x 4.3 x 4.1 cm.  Within the right ovary there is a 4.0 x 2.9 x 3.7 cm mixed echogenicity mass without internal color flow. Left ovary Measurements: 3.7 x 2.7 x 1.9 cm. Normal appearance/no adnexal mass. Pulsed Doppler evaluation of both ovaries demonstrates normal low-resistance arterial and venous waveforms. Other findings Small amount of free fluid. IMPRESSION: The right ovary is mildly enlarged and contains a mixed echogenicity predominately cystic mass without internal color flow, favored to represent a hemorrhagic cyst. The size of the ovary, and cystic lesion, place the patient at risk for intermittent torsion. Recommend continued clinical followup as well as a follow-up ultrasound in 6- 8 weeks to assess for interval evolution or resolution of probable hemorrhagic cyst right ovary.  Electronically Signed   By: Lovey Newcomer M.D.   On: 01/31/2016 12:40   [place] Art/ven Flow Abd Pelv Doppler  01/31/2016  CLINICAL DATA:  Patient with lower abdominal pain for 3 days. History of prior ovarian cyst. EXAM: TRANSABDOMINAL AND TRANSVAGINAL ULTRASOUND OF PELVIS DOPPLER ULTRASOUND OF OVARIES TECHNIQUE: Both transabdominal and transvaginal ultrasound examinations of the pelvis were performed. Transabdominal technique was performed for global imaging of the pelvis including uterus, ovaries, adnexal regions, and pelvic cul-de-sac. It was necessary to proceed with endovaginal exam following the transabdominal exam to visualize the adnexal structures. Color and duplex Doppler ultrasound was utilized to evaluate blood flow to the ovaries. COMPARISON:  CT abdomen pelvis 01/29/2016. FINDINGS: Uterus Measurements: 11.3 x 4.7 x 5.3 cm. No fibroids or other mass visualized. Endometrium Thickness: 15 mm.  No focal abnormality visualized. Right ovary Measurements: 5.3 x 4.3 x 4.1 cm. Within the right ovary there is a 4.0 x 2.9 x 3.7 cm mixed echogenicity mass without internal color flow. Left ovary Measurements: 3.7 x 2.7 x 1.9 cm. Normal appearance/no  adnexal mass. Pulsed Doppler evaluation of both ovaries demonstrates normal low-resistance arterial and venous waveforms. Other findings Small amount of free fluid. IMPRESSION: The right ovary is mildly enlarged and contains a mixed echogenicity predominately cystic mass without internal color flow, favored to represent a hemorrhagic cyst. The size of the ovary, and cystic lesion, place the patient at risk for intermittent torsion. Recommend continued clinical followup as well as a follow-up ultrasound in 6- 8 weeks to assess for interval evolution or resolution of probable hemorrhagic cyst right ovary. Electronically Signed   By: Lovey Newcomer M.D.   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  Patient with chronic headache.  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  Patient also has some altered sleep patterns due to work.  Patient now unable to take NSAIDS or ASA.  Would usually try to break the cycle with steroids but will not use due to ulcers and anemia.  Patient would likely benefit from a prophylactic therapy with some form of rescue.  Recommendations: 1.  Elavil 25mg  qhs.  Further adjustments may be necessary on an outpatient basis.   2.  Ultracet 1-2 po prn headache.  Would limit use to three times per week with plans to further wean.  Would D/C Oxy IR. 3.  MRI of the brain with and without contrast.   4.  Serum magnesium 5.  Patient to follow up with neurology on an outpatient basis  Alexis Goodell, MD Neurology [phone] 02/01/2016, 2:03 PM

## 2016-02-01 NOTE — Anesthesia Preprocedure Evaluation (Signed)
Anesthesia Evaluation  Patient identified by MRN, date of birth, ID band Patient awake    Reviewed: Allergy & Precautions, H&P , NPO status , Patient's Chart, lab work & pertinent test results  History of Anesthesia Complications Negative for: history of anesthetic complications  Airway Mallampati: II  TM Distance: >3 FB Neck ROM: full    Dental  (+) Poor Dentition, Chipped   Pulmonary neg shortness of breath, Current Smoker,    Pulmonary exam normal breath sounds clear to auscultation       Cardiovascular Exercise Tolerance: Good (-) angina+ Past MI  (-) DOE Normal cardiovascular exam Rhythm:regular Rate:Normal     Neuro/Psych  Headaches, negative psych ROS   GI/Hepatic negative GI ROS, Neg liver ROS, neg GERD  ,  Endo/Other  negative endocrine ROS  Renal/GU negative Renal ROS  negative genitourinary   Musculoskeletal   Abdominal   Peds  Hematology negative hematology ROS (+) Blood dyscrasia, anemia ,   Anesthesia Other Findings Past Medical History:   Chronic headaches                                           Past Surgical History:   CHOLECYSTECTOMY                                               c-section                                                     TUBAL LIGATION                                               BMI    Body Mass Index   25.92 kg/m 2      Reproductive/Obstetrics negative OB ROS                             Anesthesia Physical Anesthesia Plan  ASA: III  Anesthesia Plan: General   Post-op Pain Management:    Induction:   Airway Management Planned:   Additional Equipment:   Intra-op Plan:   Post-operative Plan:   Informed Consent: I have reviewed the patients History and Physical, chart, labs and discussed the procedure including the risks, benefits and alternatives for the proposed anesthesia with the patient or authorized representative who has  indicated his/her understanding and acceptance.   Dental Advisory Given  Plan Discussed with: Anesthesiologist, CRNA and Surgeon  Anesthesia Plan Comments:         Anesthesia Quick Evaluation

## 2016-02-01 NOTE — Progress Notes (Signed)
[place] [facility] [place] NAME: Faith Ellis    MR#:  [id number]  DATE OF BIRTH:  07-19-1986  SUBJECTIVE:   patient doing ok this am . S/p EGD Wants to go out for fresh air No dark-colored stools.   REVIEW OF SYSTEMS:    Review of Systems  Constitutional: Negative for fever, chills and malaise/fatigue.  HENT: Negative for ear discharge, ear pain, hearing loss, nosebleeds and sore throat.   Eyes: Negative for blurred vision and pain.  Respiratory: Negative for cough, hemoptysis, shortness of breath and wheezing.   Cardiovascular: Negative for chest pain, palpitations and leg swelling.  Gastrointestinal: Negative for nausea, vomiting, abdominal pain, diarrhea and blood in stool.  Genitourinary: Negative for dysuria.  Musculoskeletal: Negative for back pain.  Neurological: Negative for dizziness, tremors, speech change, focal weakness, seizures and headaches.  Endo/Heme/Allergies: Does not bruise/bleed easily.  Psychiatric/Behavioral: Negative for depression, suicidal ideas and hallucinations.   Tolerating Diet:yes  DRUG ALLERGIES:   Allergies  Allergen Reactions  . Keflex [Cephalexin] Rash    VITALS:  Blood pressure 91/45, pulse 72, temperature 97.5 F (36.4 C), temperature source Axillary, resp. rate 15, height 5\' 3"  (1.6 m), weight 66.361 kg (146 lb 4.8 oz), last menstrual period 01/07/2016, SpO2 99 %.  PHYSICAL EXAMINATION:   Physical Exam  Constitutional: She is oriented to person, place, and time and well-developed, well-nourished, and in no distress.  HENT:  Head: Normocephalic and atraumatic.  Mouth/Throat: Oropharynx is clear and moist.  Eyes: Conjunctivae and EOM are normal. Pupils are equal, round, and reactive to light.  Neck: Normal range of motion. Neck supple. No thyromegaly present.  Cardiovascular: Normal rate, regular rhythm, normal heart sounds and intact distal pulses.   Pulmonary/Chest: Effort normal and breath  sounds normal. She has no wheezes.  Abdominal: Soft. Bowel sounds are normal. There is tenderness. There is no rebound and no guarding.  Musculoskeletal: Normal range of motion.  Neurological: She is alert and oriented to person, place, and time. She has normal reflexes. Gait normal.  Skin: Skin is warm and dry. There is pallor.  Psychiatric: Memory, affect and judgment normal.     LABORATORY PANEL:   CBC  Recent Labs Lab 01/31/16 0433  WBC 4.3  HGB 7.8*  HCT 26.6*  PLT 107*   ------------------------------------------------------------------------------------------------------------------  Chemistries   Recent Labs Lab 01/29/16 1731  NA 138  K 3.8  CL 109  CO2 19*  GLUCOSE 123*  BUN 9  CREATININE 0.49  CALCIUM 9.1  AST 17  ALT 11*  ALKPHOS 81  BILITOT 0.2*   ------------------------------------------------------------------------------------------------------------------  Cardiac Enzymes No results for input(s): TROPONINI in the last 168 hours. ------------------------------------------------------------------------------------------------------------------  RADIOLOGY:  US Transvaginal Non-ob  01/31/2016  CLINICAL DATA:  Patient with lower abdominal pain for 3 days. History of prior ovarian cyst. EXAM: TRANSABDOMINAL AND TRANSVAGINAL ULTRASOUND OF PELVIS DOPPLER ULTRASOUND OF OVARIES TECHNIQUE: Both transabdominal and transvaginal ultrasound examinations of the pelvis were performed. Transabdominal technique was performed for global imaging of the pelvis including uterus, ovaries, adnexal regions, and pelvic cul-de-sac. It was necessary to proceed with endovaginal exam following the transabdominal exam to visualize the adnexal structures. Color and duplex Doppler ultrasound was utilized to evaluate blood flow to the ovaries. COMPARISON:  CT abdomen pelvis 01/29/2016. FINDINGS: Uterus Measurements: 11.3 x 4.7 x 5.3 cm. No fibroids or other mass visualized. Endometrium  Thickness: 15 mm.  No focal abnormality visualized. Right ovary Measurements: 5.3 x 4.3 x 4.1 cm.  Within the right ovary there is a 4.0 x 2.9 x 3.7 cm mixed echogenicity mass without internal color flow. Left ovary Measurements: 3.7 x 2.7 x 1.9 cm. Normal appearance/no adnexal mass. Pulsed Doppler evaluation of both ovaries demonstrates normal low-resistance arterial and venous waveforms. Other findings Small amount of free fluid. IMPRESSION: The right ovary is mildly enlarged and contains a mixed echogenicity predominately cystic mass without internal color flow, favored to represent a hemorrhagic cyst. The size of the ovary, and cystic lesion, place the patient at risk for intermittent torsion. Recommend continued clinical followup as well as a follow-up ultrasound in 6- 8 weeks to assess for interval evolution or resolution of probable hemorrhagic cyst right ovary. Electronically Signed   By: Lovey Newcomer M.D.   On: 01/31/2016 12:40   US Pelvis Complete  01/31/2016  CLINICAL DATA:  Patient with lower abdominal pain for 3 days. History of prior ovarian cyst. EXAM: TRANSABDOMINAL AND TRANSVAGINAL ULTRASOUND OF PELVIS DOPPLER ULTRASOUND OF OVARIES TECHNIQUE: Both transabdominal and transvaginal ultrasound examinations of the pelvis were performed. Transabdominal technique was performed for global imaging of the pelvis including uterus, ovaries, adnexal regions, and pelvic cul-de-sac. It was necessary to proceed with endovaginal exam following the transabdominal exam to visualize the adnexal structures. Color and duplex Doppler ultrasound was utilized to evaluate blood flow to the ovaries. COMPARISON:  CT abdomen pelvis 01/29/2016. FINDINGS: Uterus Measurements: 11.3 x 4.7 x 5.3 cm. No fibroids or other mass visualized. Endometrium Thickness: 15 mm.  No focal abnormality visualized. Right ovary Measurements: 5.3 x 4.3 x 4.1 cm. Within the right ovary there is a 4.0 x 2.9 x 3.7 cm mixed echogenicity mass without  internal color flow. Left ovary Measurements: 3.7 x 2.7 x 1.9 cm. Normal appearance/no adnexal mass. Pulsed Doppler evaluation of both ovaries demonstrates normal low-resistance arterial and venous waveforms. Other findings Small amount of free fluid. IMPRESSION: The right ovary is mildly enlarged and contains a mixed echogenicity predominately cystic mass without internal color flow, favored to represent a hemorrhagic cyst. The size of the ovary, and cystic lesion, place the patient at risk for intermittent torsion. Recommend continued clinical followup as well as a follow-up ultrasound in 6- 8 weeks to assess for interval evolution or resolution of probable hemorrhagic cyst right ovary. Electronically Signed   By: Lovey Newcomer M.D.   On: 01/31/2016 12:40   [place] Art/ven Flow Abd Pelv Doppler  01/31/2016  CLINICAL DATA:  Patient with lower abdominal pain for 3 days. History of prior ovarian cyst. EXAM: TRANSABDOMINAL AND TRANSVAGINAL ULTRASOUND OF PELVIS DOPPLER ULTRASOUND OF OVARIES TECHNIQUE: Both transabdominal and transvaginal ultrasound examinations of the pelvis were performed. Transabdominal technique was performed for global imaging of the pelvis including uterus, ovaries, adnexal regions, and pelvic cul-de-sac. It was necessary to proceed with endovaginal exam following the transabdominal exam to visualize the adnexal structures. Color and duplex Doppler ultrasound was utilized to evaluate blood flow to the ovaries. COMPARISON:  CT abdomen pelvis 01/29/2016. FINDINGS: Uterus Measurements: 11.3 x 4.7 x 5.3 cm. No fibroids or other mass visualized. Endometrium Thickness: 15 mm.  No focal abnormality visualized. Right ovary Measurements: 5.3 x 4.3 x 4.1 cm. Within the right ovary there is a 4.0 x 2.9 x 3.7 cm mixed echogenicity mass without internal color flow. Left ovary Measurements: 3.7 x 2.7 x 1.9 cm. Normal appearance/no adnexal mass. Pulsed Doppler evaluation of both ovaries demonstrates normal  low-resistance arterial and venous waveforms. Other findings Small amount of free fluid. IMPRESSION: The  right ovary is mildly enlarged and contains a mixed echogenicity predominately cystic mass without internal color flow, favored to represent a hemorrhagic cyst. The size of the ovary, and cystic lesion, place the patient at risk for intermittent torsion. Recommend continued clinical followup as well as a follow-up ultrasound in 6- 8 weeks to assess for interval evolution or resolution of probable hemorrhagic cyst right ovary. Electronically Signed   By: Lovey Newcomer M.D.   On: 01/31/2016 12:40     ASSESSMENT AND PLAN:   30 year old female who presented with abdominal pain and found to have significant iron deficiency anemia.  1. Significant iron deficiency anemia: Patient is status post 3 units PRBC. -5.2--8.6--7.8 Anemia panel is consistent with iron deficiency likely from heavy menses with possible slow upper GI bleed. EGD showed ulcer,peptic Continue PPI. GI consult appreciated -IV venofer today. Pt will f/u hematology as out pt  2. Abdominal pain: This is most likely due to a large ovarian cyst. -GYN consult appreciated -f/u out pt. They do not seem her menstrual cycles are contributing to anemia  3. DVT prophylaxis SCD  4. Tobacco abuse smoking cessation advised On CLD---adviance to [facility] on Saturday and likely d./c home if reamins stable  5. Chronic headaches Dr Doy Mince to see for prohylaxis/recommendations  Management plans discussed with the patient and she is in agreement.  CODE STATUS: FULL  TOTAL TIME TAKING CARE OF THIS PATIENT: 30 minutes.   D/w dr Tiffany Kocher and family  Corinthian Mizrahi M.D on 02/01/2016 at 11:41 AM  Between 7am to 6pm - Pager - [phone] After 6pm go to [email or web address] - password [email or web address] [facility]  Office  [phone]  CC: Primary care physician; No PCP Per Patient  Note: This dictation was prepared with Dragon dictation along  with smaller phrase technology. Any transcriptional errors that result from this process are unintentional.

## 2016-02-01 NOTE — Op Note (Signed)
[facility]astroenter[facility] Patient Name: Faith Ellis Procedure Date: 02/01/2016 8:44 AM MRN: [id number] Account #: [id number] Date of Birth: 1985/12/12 Admit Type: Inpatient Age: 30 Room: [name] ENDO ROOM 1 Gender: Female Note Status: Finalized Procedure:            Upper GI endoscopy Indications:          Epigastric abdominal pain, Iron deficiency anemia                        secondary to chronic blood loss Providers:            [name], MD Referring MD:         No Local Md, MD (Referring MD) Medicines:            Propofol per Anesthesia Complications:        No immediate complications. Procedure:            Pre-Anesthesia Assessment:                       - After reviewing the risks and benefits, the patient                        was deemed in satisfactory condition to undergo the                        procedure.                       After obtaining informed consent, the endoscope was                        passed under direct vision. Throughout the procedure,                        the patient's blood pressure, pulse, and oxygen                        saturations were monitored continuously. The Endoscope                        was introduced through the mouth, and advanced to the                        second part of duodenum. The upper GI endoscopy was                        accomplished without difficulty. The patient tolerated                        the procedure well. Findings:      The examined esophagus was normal. GEJ 40cm.      One non-bleeding cratered and linear gastric ulcer with pigmented       material was found on the lesser curvature of the gastric antrum. The       lesion was 8 mm in largest dimension. There was recently bled a very       small amt of fresh blood but no ;active bleeding. The ulcer was linear       and somewhat superficial      The examined duodenum was normal. Impression:           -  Normal esophagus.                  - Non-bleeding gastric ulcer with pigmented material.                       - Normal examined duodenum.                       - No specimens collected. Recommendation:       - The findings and recommendations were discussed with                        the referring physician. Recommend 2 more days in                        hospital and transfuse as needed to keep hgb above 7.                        Iron infusion. Avoid all NSAID meds. [name], MD 02/01/2016 9:06:19 AM This report has been signed electronically. Number of Addenda: 0 Note Initiated On: 02/01/2016 8:44 AM      [facility]

## 2016-02-01 NOTE — Transfer of Care (Signed)
Immediate Anesthesia Transfer of Care Note  Patient: Faith Ellis  Procedure(s) Performed: Procedure(s): ESOPHAGOGASTRODUODENOSCOPY (EGD) (N/A)  Patient Location: PACU  Anesthesia Type:General  Level of Consciousness: sedated  Airway & Oxygen Therapy: Patient Spontanous Breathing and Patient connected to nasal cannula oxygen  Post-op Assessment: Report given to RN and Post -op Vital signs reviewed and stable  Post vital signs: Reviewed and stable  Last Vitals:  Filed Vitals:   01/31/16 2137 02/01/16 0546  BP: 108/59 96/50  Pulse: 62 46  Temp: 36.9 C 37.1 C  Resp: 18 18    Last Pain:  Filed Vitals:   02/01/16 0745  PainSc: 4          Complications: No apparent anesthesia complications

## 2016-02-01 NOTE — Plan of Care (Signed)
Problem: Safety: Goal: Ability to remain free from injury will improve Outcome: Progressing Pt remains free from injury.  Independent.

## 2016-02-02 LAB — CBC
HEMATOCRIT: 28.2 % — AB (ref 35.0–47.0)
Hemoglobin: 8 g/dL — ABNORMAL LOW (ref 12.0–16.0)
MCH: 18.1 pg — AB (ref 26.0–34.0)
MCHC: 28.4 g/dL — AB (ref 32.0–36.0)
MCV: 63.7 fL — AB (ref 80.0–100.0)
Platelets: 129 10*3/uL — ABNORMAL LOW (ref 150–440)
RBC: 4.43 MIL/uL (ref 3.80–5.20)
RDW: 38.5 % — AB (ref 11.5–14.5)
WBC: 4.2 10*3/uL (ref 3.6–11.0)

## 2016-02-02 MED ORDER — FERROUS SULFATE 325 (65 FE) MG PO TABS: 325.0000 mg | ORAL_TABLET | Freq: Three times a day (TID) | ORAL | Status: DC

## 2016-02-02 MED ORDER — TRAMADOL-ACETAMINOPHEN 37.5-325 MG PO TABS: 1.0000 | ORAL_TABLET | Freq: Three times a day (TID) | ORAL | Status: DC | PRN

## 2016-02-02 MED ORDER — AMITRIPTYLINE HCL 25 MG PO TABS: 25.0000 mg | ORAL_TABLET | Freq: Every day | ORAL | Status: DC

## 2016-02-02 MED ORDER — PANTOPRAZOLE SODIUM 40 MG PO TBEC: 40.0000 mg | DELAYED_RELEASE_TABLET | Freq: Two times a day (BID) | ORAL | Status: DC

## 2016-02-02 MED ORDER — SUCRALFATE 1 G PO TABS: 1.0000 g | ORAL_TABLET | Freq: Three times a day (TID) | ORAL | Status: DC

## 2016-02-02 NOTE — Progress Notes (Signed)
[place] at [place] was admitted to the Hospital on 01/29/2016 and Discharged  02/02/2016 and should be excused from work/school   for 6 days starting 01/29/2016 , may return to work/school without any restrictions.  [name] MD.  [name] M.D on 02/02/2016,at 3:18 PM  [place] [facility]  [phone]

## 2016-02-02 NOTE — Progress Notes (Signed)
Subjective: Patient reports tolerating Elavil with no side effects noted.    Objective: Current vital signs: BP 103/48 mmHg  Pulse 48  Temp(Src) 98.7 F (37.1 C) (Oral)  Resp 20  Ht 5\' 3"  (1.6 m)  Wt 66.361 kg (146 lb 4.8 oz)  BMI 25.92 kg/m2  SpO2 99%  LMP 01/07/2016 (Approximate) Vital signs in last 24 hours: Temp:  [98.2 F (36.8 C)-98.8 F (37.1 C)] 98.7 F (37.1 C) (05/06 0534) Pulse Rate:  [48-55] 48 (05/06 0534) Resp:  [20] 20 (05/06 0534) BP: (97-111)/(46-62) 103/48 mmHg (05/06 0534) SpO2:  [98 %-100 %] 99 % (05/06 0534)  Intake/Output from previous day: 05/05 0701 - 05/06 0700 In: 660 [P.O.:360; I.V.:300] Out: 0  Intake/Output this shift: Total I/O In: 720 [P.O.:720] Out: -  Nutritional status: Diet - low sodium heart healthy DIET SOFT Room service appropriate?: Yes; Fluid consistency:: Thin  Neurologic Exam: Mental Status: Alert, oriented, thought content appropriate. Speech fluent without evidence of aphasia. Able to follow 3 step commands without difficulty. Cranial Nerves: II: Discs flat bilaterally; Visual fields grossly normal, pupils equal, round, reactive to light and accommodation III,IV, VI: ptosis not present, extra-ocular motions intact bilaterally V,VII: smile symmetric, facial light touch sensation normal bilaterally VIII: hearing normal bilaterally IX,X: gag reflex present XI: bilateral shoulder shrug XII: midline tongue extension Motor: Right :Upper extremity 5/5Left: Upper extremity 5/5 Lower extremity 5/5Lower extremity 5/5   Lab Results: Basic Metabolic Panel:  Recent Labs Lab 01/29/16 1731 02/01/16 1438  NA 138  --   K 3.8  --   CL 109  --   CO2 19*  --   GLUCOSE 123*  --   BUN 9  --   CREATININE 0.49  --   CALCIUM 9.1  --   MG  --  2.0    Liver Function Tests:  Recent Labs Lab 01/29/16 1731  AST 17   ALT 11*  ALKPHOS 81  BILITOT 0.2*  PROT 7.2  ALBUMIN 4.4    Recent Labs Lab 01/29/16 1731  LIPASE 21   No results for input(s): AMMONIA in the last 168 hours.  CBC:  Recent Labs Lab 01/29/16 1731 01/30/16 1035 01/31/16 0433 02/02/16 0803  WBC 6.6 3.7 4.3 4.2  HGB 5.2* 8.1*  TEST WILL BE CREDITED 7.8* 8.0*  HCT 20.1* 28.1*  TEST WILL BE CREDITED 26.6* 28.2*  MCV 50.2* 61.4* 60.9* 63.7*  PLT 140* 114* 107* 129*    Cardiac Enzymes: No results for input(s): CKTOTAL, CKMB, CKMBINDEX, TROPONINI in the last 168 hours.  Lipid Panel: No results for input(s): CHOL, TRIG, HDL, CHOLHDL, VLDL, LDLCALC in the last 168 hours.  CBG: No results for input(s): GLUCAP in the last 168 hours.  Microbiology: Results for orders placed or performed in visit on 09/09/14  Urine culture     Status: None   Collection Time: 09/09/14  8:13 PM  Result Value Ref Range Status   Micro Text Report   Final       SOURCE: CLEAN CATCH    COMMENT                   MIXED BACTERIAL ORGANISMS   COMMENT                   RESULTS SUGGESTIVE OF CONTAMINATION   ANTIBIOTIC  Coagulation Studies: No results for input(s): LABPROT, INR in the last 72 hours.  Imaging: Mr Jeri Cos [id number] Contrast  02/01/2016  CLINICAL DATA:  Headaches over the last 2-3 years. Multi focal headaches. Associated nausea, photophobia, and phonophobia. EXAM: MRI HEAD WITHOUT AND WITH CONTRAST TECHNIQUE: Multiplanar, multiecho pulse sequences of the brain and surrounding structures were obtained without and with intravenous contrast. CONTRAST:  24mL MULTIHANCE GADOBENATE DIMEGLUMINE 529 MG/ML IV SOLN COMPARISON:  None. FINDINGS: No acute infarct, hemorrhage, or mass lesion is present. No significant extraaxial fluid collection is present. The ventricles are of normal size. No significant white matter disease is present. The internal auditory canals are within normal limits  bilaterally. The brainstem and cerebellum are normal. Flow is present in the major intracranial arteries. The globes and orbits are intact. The paranasal sinuses and the mastoid air cells are clear. The postcontrast images demonstrate no pathologic enhancement. 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 IMPRESSION: Negative MRI of the brain. No acute or focal lesion to explain headaches. Electronically Signed   By: San Morelle M.D.   On: 02/01/2016 15:35   US Transvaginal Non-ob  01/31/2016  CLINICAL DATA:  Patient with lower abdominal pain for 3 days. History of prior ovarian cyst. EXAM: TRANSABDOMINAL AND TRANSVAGINAL ULTRASOUND OF PELVIS DOPPLER ULTRASOUND OF OVARIES TECHNIQUE: Both transabdominal and transvaginal ultrasound examinations of the pelvis were performed. Transabdominal technique was performed for global imaging of the pelvis including uterus, ovaries, adnexal regions, and pelvic cul-de-sac. It was necessary to proceed with endovaginal exam following the transabdominal exam to visualize the adnexal structures. Color and duplex Doppler ultrasound was utilized to evaluate blood flow to the ovaries. COMPARISON:  CT abdomen pelvis 01/29/2016. FINDINGS: Uterus Measurements: 11.3 x 4.7 x 5.3 cm. No fibroids or other mass visualized. Endometrium Thickness: 15 mm.  No focal abnormality visualized. Right ovary Measurements: 5.3 x 4.3 x 4.1 cm. Within the right ovary there is a 4.0 x 2.9 x 3.7 cm mixed echogenicity mass without internal color flow. Left ovary Measurements: 3.7 x 2.7 x 1.9 cm. Normal appearance/no adnexal mass. Pulsed Doppler evaluation of both ovaries demonstrates normal low-resistance arterial and venous waveforms. Other findings Small amount of free fluid. IMPRESSION: The right ovary is mildly enlarged and contains a mixed echogenicity predominately cystic mass without internal color flow, favored to represent a hemorrhagic cyst. The size of  the ovary, and cystic lesion, place the patient at risk for intermittent torsion. Recommend continued clinical followup as well as a follow-up ultrasound in 6- 8 weeks to assess for interval evolution or resolution of probable hemorrhagic cyst right ovary. Electronically Signed   By: Lovey Newcomer M.D.   On: 01/31/2016 12:40   US Pelvis Complete  01/31/2016  CLINICAL DATA:  Patient with lower abdominal pain for 3 days. History of prior ovarian cyst. EXAM: TRANSABDOMINAL AND TRANSVAGINAL ULTRASOUND OF PELVIS DOPPLER ULTRASOUND OF OVARIES TECHNIQUE: Both transabdominal and transvaginal ultrasound examinations of the pelvis were performed. Transabdominal technique was performed for global imaging of the pelvis including uterus, ovaries, adnexal regions, and pelvic cul-de-sac. It was necessary to proceed with endovaginal exam following the transabdominal exam to visualize the adnexal structures. Color and duplex Doppler ultrasound was utilized to evaluate blood flow to the ovaries. COMPARISON:  CT abdomen pelvis 01/29/2016. FINDINGS: Uterus Measurements: 11.3 x 4.7 x 5.3 cm. No fibroids or other mass visualized. Endometrium Thickness: 15 mm.  No focal abnormality visualized. Right ovary Measurements: 5.3 x  4.3 x 4.1 cm. Within the right ovary there is a 4.0 x 2.9 x 3.7 cm mixed echogenicity mass without internal color flow. Left ovary Measurements: 3.7 x 2.7 x 1.9 cm. Normal appearance/no adnexal mass. Pulsed Doppler evaluation of both ovaries demonstrates normal low-resistance arterial and venous waveforms. Other findings Small amount of free fluid. IMPRESSION: The right ovary is mildly enlarged and contains a mixed echogenicity predominately cystic mass without internal color flow, favored to represent a hemorrhagic cyst. The size of the ovary, and cystic lesion, place the patient at risk for intermittent torsion. Recommend continued clinical followup as well as a follow-up ultrasound in 6- 8 weeks to assess for  interval evolution or resolution of probable hemorrhagic cyst right ovary. Electronically Signed   By: Lovey Newcomer M.D.   On: 01/31/2016 12:40   [place] Art/ven Flow Abd Pelv Doppler  01/31/2016  CLINICAL DATA:  Patient with lower abdominal pain for 3 days. History of prior ovarian cyst. EXAM: TRANSABDOMINAL AND TRANSVAGINAL ULTRASOUND OF PELVIS DOPPLER ULTRASOUND OF OVARIES TECHNIQUE: Both transabdominal and transvaginal ultrasound examinations of the pelvis were performed. Transabdominal technique was performed for global imaging of the pelvis including uterus, ovaries, adnexal regions, and pelvic cul-de-sac. It was necessary to proceed with endovaginal exam following the transabdominal exam to visualize the adnexal structures. Color and duplex Doppler ultrasound was utilized to evaluate blood flow to the ovaries. COMPARISON:  CT abdomen pelvis 01/29/2016. FINDINGS: Uterus Measurements: 11.3 x 4.7 x 5.3 cm. No fibroids or other mass visualized. Endometrium Thickness: 15 mm.  No focal abnormality visualized. Right ovary Measurements: 5.3 x 4.3 x 4.1 cm. Within the right ovary there is a 4.0 x 2.9 x 3.7 cm mixed echogenicity mass without internal color flow. Left ovary Measurements: 3.7 x 2.7 x 1.9 cm. Normal appearance/no adnexal mass. Pulsed Doppler evaluation of both ovaries demonstrates normal low-resistance arterial and venous waveforms. Other findings Small amount of free fluid. IMPRESSION: The right ovary is mildly enlarged and contains a mixed echogenicity predominately cystic mass without internal color flow, favored to represent a hemorrhagic cyst. The size of the ovary, and cystic lesion, place the patient at risk for intermittent torsion. Recommend continued clinical followup as well as a follow-up ultrasound in 6- 8 weeks to assess for interval evolution or resolution of probable hemorrhagic cyst right ovary. Electronically Signed   By: Lovey Newcomer M.D.   On: 01/31/2016 12:40    Medications:  I have  reviewed the patient's current medications. Scheduled: . amitriptyline  25 mg Oral QHS  . docusate sodium  100 mg Oral BID  . ferrous sulfate  325 mg Oral TID WC  . folic acid  1 mg Oral Daily  . nicotine  14 mg Transdermal Daily  . pantoprazole  40 mg Oral BID AC  . sucralfate  1 g Oral TID WC & HS    Assessment/Plan: Patient tolerating Elavil.  Do not expect a marked improvement in headaches at this time and this was explained to patient.  To continue Elavil on an outpatient basis.  MRI of the brain personally reviewed and shows no acute changes.  Magnesium normal at 2.0.    No further neurologic intervention is recommended at this time.  If further questions arise, please call or page at that time.  Thank you for allowing neurology to participate in the care of this patient.  Patient to follow up with neurology on an outpatient basis.      LOS: 2 days   Magda Paganini  Doy Mince, MD Neurology [phone] 02/02/2016  11:59 AM

## 2016-02-02 NOTE — Consult Note (Signed)
GI Inpatient Follow-up Note  Patient Identification: Faith Ellis is a 30 y.o. female with severe anemia, upper GI bleed secondary to chronic NSAID use  Subjective: Is doing much better today.  She had an upper endoscopy completed on May 5 with Dr. Vira Agar.  Findings included one nonbleeding cratered and linear gastric ulcer with pigmented material was found on the lesser curvature of the gastric antrum.  The lesion was 8 mm in largest dimension.  There was a recent bleed, a very small amount of fresh blood, but no active bleeding.  The ulcer was linear and somewhat superficial.  Normal esophagus.  Normal examined duodenum.  She reports she did have one episode of vomiting yesterday with a small amount of blood, this was after the upper endoscopy.  She had a bowel movement late last night, unremarkable.  She tolerated full liquids at breakfast.  She reports she still having some abdominal pain.  She has been able to get up and walk around and go outside to smoke.  She had a neurology consult that gave her great recommendations to help prevent headaches without having to use NSAIDs.  She had an MRI head completed that was unremarkable.  Scheduled Inpatient Medications:  . amitriptyline  25 mg Oral QHS  . docusate sodium  100 mg Oral BID  . ferrous sulfate  325 mg Oral TID WC  . folic acid  1 mg Oral Daily  . nicotine  14 mg Transdermal Daily  . pantoprazole  40 mg Oral BID AC  . sucralfate  1 g Oral TID WC & HS    Continuous Inpatient Infusions:     PRN Inpatient Medications:  acetaminophen **OR** acetaminophen, ondansetron **OR** ondansetron (ZOFRAN) IV, traMADol-acetaminophen  Review of Systems: Constitutional: Weight is stable.  Eyes: No changes in vision. ENT: No oral lesions, sore throat.  GI: see HPI.  Heme/Lymph: No easy bruising.  CV: No chest pain.  GU: No hematuria.  Integumentary: No rashes.  Neuro: No headaches.  Psych: No depression/anxiety.  Endocrine: No  heat/cold intolerance.  Allergic/Immunologic: No urticaria.  Resp: No cough, SOB.  Musculoskeletal: No joint swelling.    Physical Examination: BP 103/48 mmHg  Pulse 48  Temp(Src) 98.7 F (37.1 C) (Oral)  Resp 20  Ht 5\' 3"  (1.6 m)  Wt 66.361 kg (146 lb 4.8 oz)  BMI 25.92 kg/m2  SpO2 99%  LMP 01/07/2016 (Approximate) Gen: NAD, alert and oriented x 4 HEENT: PEERLA, EOMI, Neck: supple, no JVD or thyromegaly Chest: CTA bilaterally, no wheezes, crackles, or other adventitious sounds CV: RRR, no m/g/c/r Abd: soft,bilateral lower abdominal tenderness, epigastric tenderness, ND, +BS in all four quadrants; no HSM, guarding, ridigity, or rebound tenderness Ext: no edema, well perfused with 2+ pulses, Skin: no rash or lesions noted Lymph: no LAD  Data: Lab Results  Component Value Date   WBC 4.2 02/02/2016   HGB 8.0* 02/02/2016   HCT 28.2* 02/02/2016   MCV 63.7* 02/02/2016   PLT 129* 02/02/2016    Recent Labs Lab 01/30/16 1035 01/31/16 0433 02/02/16 0803  HGB 8.1*  TEST WILL BE CREDITED 7.8* 8.0*   Lab Results  Component Value Date   NA 138 01/29/2016   K 3.8 01/29/2016   CL 109 01/29/2016   CO2 19* 01/29/2016   BUN 9 01/29/2016   CREATININE 0.49 01/29/2016   Lab Results  Component Value Date   ALT 11* 01/29/2016   AST 17 01/29/2016   ALKPHOS 81 01/29/2016   BILITOT 0.2* 01/29/2016  No results for input(s): APTT, INR, PTT in the last 168 hours. Assessment/Plan: [name] is a 30 y.o. female with severe anemia, upper GI bleed secondary to NSAID use.  Hgb is stable at 8.0  Recommendations: We recommend that as long as she is able to tolerate soft food at lunch she is fine for discharge.   Strongly recommend discharge on Protonix oral 40 mg twice daily, Carafate 3 times daily and follow-up in our office at the end of the week.  Continue to avoid all NSAIDs.  Please call with questions or concerns.  Salvadore Farber, PA-C  I personally performed these  services.

## 2016-02-02 NOTE — Progress Notes (Signed)
Patient tolerate soft diet no c/o of nausea or vomiting, MD notified, patient to be discharge home, discharge instruction provided, patient to call Dr Tiffany Kocher office on Monday for follow up appointment. Continue soft diet as tolerated. IV removed.

## 2016-02-02 NOTE — Discharge Summary (Signed)
[place] [facility] [place] NAME: Faith Ellis    MR#:  [id number]  DATE OF BIRTH:  Sep 03, 1986  DATE OF ADMISSION:  01/29/2016 ADMITTING PHYSICIAN: Harrie Foreman, MD  DATE OF DISCHARGE: 02/02/2016 PRIMARY CARE PHYSICIAN: No PCP Per Patient    ADMISSION DIAGNOSIS:  Intractable abdominal pain [R10.9] Severe anemia [D64.9]   DISCHARGE DIAGNOSIS:  Significant iron deficiency anemia PUD large ovarian cyst. SECONDARY DIAGNOSIS:   Past Medical History  Diagnosis Date  . Chronic headaches     HOSPITAL COURSE:  30 year old female who presented with abdominal pain and found to have significant iron deficiency anemia.  1. Significant iron deficiency anemia: Patient is status post 3 units PRBC. -5.2--8.6--7.8--8.0. Anemia panel is consistent with iron deficiency likely from heavy menses with slow upper GI bleed. EGD showed ulcer,peptic Continue PPI and carfate.  She got IV venofer yesterday.  She tolerated soft diet. No active bleeding or melena.  2. Abdominal pain: This is most likely due to a large ovarian cyst. -GYN consult appreciated a thoracentesis with no evidence of infection. -f/u out pt. They do not seem her menstrual cycles are contributing to anemia  3. DVT prophylaxis SCD  4. Tobacco abuse smoking cessation advised On CLD---adviance to [facility] on Saturday and likely d./c home if reamins stable  5. Chronic headaches Per Dr Doy Mince, Elavil 25mg  qhs. Further adjustments may be necessary on an outpatient basis. Ultracet 1-2 po prn headache. MRI brain is negative.  I discussed with Dr. Tiffany Kocher. DISCHARGE CONDITIONS:   Stable, discharge to home today.  CONSULTS OBTAINED:  Treatment Team:  [name], MD Aletha Halim, MD Boykin Nearing, MD Catarina Hartshorn, MD  DRUG ALLERGIES:   Allergies  Allergen Reactions  . Keflex [Cephalexin] Rash    DISCHARGE MEDICATIONS:   Current Discharge  Medication List    START taking these medications   Details  amitriptyline (ELAVIL) 25 MG tablet Take 1 tablet (25 mg total) by mouth at bedtime. Qty: 30 tablet, Refills: 0    ferrous sulfate 325 (65 FE) MG tablet Take 1 tablet (325 mg total) by mouth 3 (three) times daily with meals. Qty: 90 tablet, Refills: 0    pantoprazole (PROTONIX) 40 MG tablet Take 1 tablet (40 mg total) by mouth 2 (two) times daily before a meal. Qty: 60 tablet, Refills: 0    sucralfate (CARAFATE) 1 g tablet Take 1 tablet (1 g total) by mouth 4 (four) times daily -  with meals and at bedtime. Qty: 120 tablet, Refills: 0    traMADol-acetaminophen (ULTRACET) 37.5-325 MG tablet Take 1 tablet by mouth every 8 (eight) hours as needed for moderate pain or severe pain. Qty: 15 tablet, Refills: 0      STOP taking these medications     Aspirin-Salicylamide-Caffeine (BC HEADACHE POWDER PO)          DISCHARGE INSTRUCTIONS:    If you experience worsening of your admission symptoms, develop shortness of breath, life threatening emergency, suicidal or homicidal thoughts you must seek medical attention immediately by calling 911 or calling your MD immediately  if symptoms less severe.  You Must read complete instructions/literature along with all the possible adverse reactions/side effects for all the Medicines you take and that have been prescribed to you. Take any new Medicines after you have completely understood and accept all the possible adverse reactions/side effects.   Please note  You were cared for by a hospitalist during your hospital stay. If  you have any questions about your discharge medications or the care you received while you were in the hospital after you are discharged, you can call the unit and asked to speak with the hospitalist on call if the hospitalist that took care of you is not available. Once you are discharged, your primary care physician will handle any further medical issues. Please note  that NO REFILLS for any discharge medications will be authorized once you are discharged, as it is imperative that you return to your primary care physician (or establish a relationship with a primary care physician if you do not have one) for your aftercare needs so that they can reassess your need for medications and monitor your lab values.    Today   SUBJECTIVE   No.   VITAL SIGNS:  Blood pressure 115/55, pulse 58, temperature 99.3 F (37.4 C), temperature source Oral, resp. rate 20, height 5\' 3"  (1.6 m), weight 66.361 kg (146 lb 4.8 oz), last menstrual period 01/07/2016, SpO2 100 %.  I/O:   Intake/Output Summary (Last 24 hours) at 02/02/16 1417 Last data filed at 02/02/16 1300  Gross per 24 hour  Intake   1320 ml  Output      0 ml  Net   1320 ml    PHYSICAL EXAMINATION:  GENERAL:  30 y.o.-year-old patient lying in the bed with no acute distress.  EYES: Pupils equal, round, reactive to light and accommodation. No scleral icterus. Extraocular muscles intact.  HEENT: Head atraumatic, normocephalic. Oropharynx and nasopharynx clear.  NECK:  Supple, no jugular venous distention. No thyroid enlargement, no tenderness.  LUNGS: Normal breath sounds bilaterally, no wheezing, rales,rhonchi or crepitation. No use of accessory muscles of respiration.  CARDIOVASCULAR: S1, S2 normal. No murmurs, rubs, or gallops.  ABDOMEN: Soft, non-tender, non-distended. Bowel sounds present. No organomegaly or mass.  EXTREMITIES: No pedal edema, cyanosis, or clubbing.  NEUROLOGIC: Cranial nerves II through XII are intact. Muscle strength 5/5 in all extremities. Sensation intact. Gait not checked.  PSYCHIATRIC: The patient is alert and oriented x 3.  SKIN: No obvious rash, lesion, or ulcer.   DATA REVIEW:   CBC  Recent Labs Lab 02/02/16 0803  WBC 4.2  HGB 8.0*  HCT 28.2*  PLT 129*    Chemistries   Recent Labs Lab 01/29/16 1731 02/01/16 1438  NA 138  --   K 3.8  --   CL 109  --    CO2 19*  --   GLUCOSE 123*  --   BUN 9  --   CREATININE 0.49  --   CALCIUM 9.1  --   MG  --  2.0  AST 17  --   ALT 11*  --   ALKPHOS 81  --   BILITOT 0.2*  --     Cardiac Enzymes No results for input(s): TROPONINI in the last 168 hours.  Microbiology Results  Results for orders placed or performed in visit on 09/09/14  Urine culture     Status: None   Collection Time: 09/09/14  8:13 PM  Result Value Ref Range Status   Micro Text Report   Final       SOURCE: CLEAN CATCH    COMMENT                   MIXED BACTERIAL ORGANISMS   COMMENT                   RESULTS SUGGESTIVE OF CONTAMINATION   ANTIBIOTIC  RADIOLOGY:  Mr Kizzie Fantasia Contrast  02/01/2016  CLINICAL DATA:  Headaches over the last 2-3 years. Multi focal headaches. Associated nausea, photophobia, and phonophobia. EXAM: MRI HEAD WITHOUT AND WITH CONTRAST TECHNIQUE: Multiplanar, multiecho pulse sequences of the brain and surrounding structures were obtained without and with intravenous contrast. CONTRAST:  4mL MULTIHANCE GADOBENATE DIMEGLUMINE 529 MG/ML IV SOLN COMPARISON:  None. FINDINGS: No acute infarct, hemorrhage, or mass lesion is present. No significant extraaxial fluid collection is present. The ventricles are of normal size. No significant white matter disease is present. The internal auditory canals are within normal limits bilaterally. The brainstem and cerebellum are normal. Flow is present in the major intracranial arteries. The globes and orbits are intact. The paranasal sinuses and the mastoid air cells are clear. The postcontrast images demonstrate no pathologic enhancement. 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 IMPRESSION: Negative MRI of the brain. No acute or focal lesion to explain headaches. Electronically Signed   By: San Morelle M.D.   On: 02/01/2016 15:35        Management plans discussed  with the patient, her husband and they are in agreement.  CODE STATUS:     Code Status Orders        Start     Ordered   01/30/16 0054  Full code   Continuous     01/30/16 0053    Code Status History    Date Active Date Inactive Code Status Order ID Comments User Context   This patient has a current code status but no historical code status.      TOTAL TIME TAKING CARE OF THIS PATIENT: 36 minutes.    [name] M.D on 02/02/2016 at 2:17 PM  Between 7am to 6pm - Pager - [phone]  After 6pm go to [email or web address] - password [email or web address] [facility]  [facility]  Office  [phone]  CC: Primary care physician; No PCP Per Patient

## 2016-02-02 NOTE — Discharge Instructions (Signed)
Soft diet for 1 week, then regular diet. Avoid NSAIDS. Smoking cessation.

## 2016-02-02 NOTE — Progress Notes (Signed)
Bedside report received, patient presently  Resting in the room, remain alert and oriented, denies any pain at this time, vss, family at bedside, am meds given, no distress noted .

## 2016-02-03 ENCOUNTER — Encounter: Payer: Self-pay | Admitting: Unknown Physician Specialty

## 2016-02-05 ENCOUNTER — Telehealth: Payer: Self-pay | Admitting: Nurse Practitioner

## 2016-02-05 NOTE — Telephone Encounter (Signed)
Invalid phone number.

## 2016-04-24 ENCOUNTER — Encounter: Payer: Self-pay | Admitting: *Deleted

## 2016-04-25 ENCOUNTER — Ambulatory Visit: Payer: Self-pay | Admitting: Anesthesiology

## 2016-04-25 ENCOUNTER — Encounter: Payer: Self-pay | Admitting: *Deleted

## 2016-04-25 ENCOUNTER — Encounter
Admission: RE | Disposition: A | Discharge status: Home / Self Care | Payer: Self-pay | Source: Ambulatory Visit | Attending: Unknown Physician Specialty

## 2016-04-25 ENCOUNTER — Ambulatory Visit
Admission: RE | Admit: 2016-04-25 | Discharge: 2016-04-25 | Disposition: A | Discharge status: Home / Self Care | Payer: Self-pay | Source: Ambulatory Visit | Attending: Unknown Physician Specialty | Admitting: Unknown Physician Specialty

## 2016-04-25 DIAGNOSIS — D649 Anemia, unspecified: Secondary | ICD-10-CM | POA: Insufficient documentation

## 2016-04-25 DIAGNOSIS — Z79899 Other long term (current) drug therapy: Secondary | ICD-10-CM | POA: Insufficient documentation

## 2016-04-25 DIAGNOSIS — K449 Diaphragmatic hernia without obstruction or gangrene: Secondary | ICD-10-CM | POA: Insufficient documentation

## 2016-04-25 DIAGNOSIS — K253 Acute gastric ulcer without hemorrhage or perforation: Secondary | ICD-10-CM | POA: Insufficient documentation

## 2016-04-25 DIAGNOSIS — F17290 Nicotine dependence, other tobacco product, uncomplicated: Secondary | ICD-10-CM | POA: Insufficient documentation

## 2016-04-25 HISTORY — PX: ESOPHAGOGASTRODUODENOSCOPY (EGD) WITH PROPOFOL: SHX5813

## 2016-04-25 HISTORY — DX: Anemia, unspecified: D64.9

## 2016-04-25 HISTORY — DX: Gastric ulcer, unspecified as acute or chronic, without hemorrhage or perforation: K25.9

## 2016-04-25 LAB — POCT PREGNANCY, URINE: Preg Test, Ur: NEGATIVE

## 2016-04-25 SURGERY — ESOPHAGOGASTRODUODENOSCOPY (EGD) WITH PROPOFOL
Anesthesia: General

## 2016-04-25 MED ORDER — PROPOFOL 10 MG/ML IV BOLUS
INTRAVENOUS | Status: DC | PRN
  Administered 2016-04-25: 100 mg via INTRAVENOUS

## 2016-04-25 MED ORDER — ONDANSETRON HCL 4 MG/2ML IJ SOLN: 4.0000 mg | Freq: Once | INTRAMUSCULAR | Status: DC | PRN

## 2016-04-25 MED ORDER — LACTATED RINGERS IV SOLN
INTRAVENOUS | Status: DC | PRN
  Administered 2016-04-25: 13:00:00 via INTRAVENOUS

## 2016-04-25 MED ORDER — FENTANYL CITRATE (PF) 100 MCG/2ML IJ SOLN: 25.0000 ug | INTRAMUSCULAR | Status: DC | PRN

## 2016-04-25 MED ORDER — SODIUM CHLORIDE 0.9 % IV SOLN
INTRAVENOUS | Status: DC
  Administered 2016-04-25: 1000 mL via INTRAVENOUS

## 2016-04-25 MED ORDER — SODIUM CHLORIDE 0.9 % IV SOLN: INTRAVENOUS | Status: DC

## 2016-04-25 MED ORDER — PROPOFOL 500 MG/50ML IV EMUL
INTRAVENOUS | Status: DC | PRN
  Administered 2016-04-25: 125 ug/kg/min via INTRAVENOUS

## 2016-04-25 NOTE — Transfer of Care (Signed)
Immediate Anesthesia Transfer of Care Note  Patient: Faith Ellis  Procedure(s) Performed: Procedure(s): ESOPHAGOGASTRODUODENOSCOPY (EGD) WITH PROPOFOL (N/A)  Patient Location: PACU and Endoscopy Unit  Anesthesia Type:General  Level of Consciousness: awake, alert  and oriented  Airway & Oxygen Therapy: Patient Spontanous Breathing and Patient connected to nasal cannula oxygen  Post-op Assessment: Report given to RN and Post -op Vital signs reviewed and stable  Post vital signs: Reviewed and stable  Last Vitals:  Vitals:   04/25/16 1251 04/25/16 1323  BP: 118/61 (!) 92/40  Pulse: 81 78  Resp: 20 (!) 24  Temp: 36.4 C (!) 36.1 C    Last Pain:  Vitals:   04/25/16 1323  TempSrc: Tympanic  PainSc: Asleep         Complications: No apparent anesthesia complications

## 2016-04-25 NOTE — H&P (Signed)
   Primary Care Physician:  No PCP Per Patient Primary Gastroenterologist:  Dr. Vira Agar  Pre-Procedure History & Physical: HPI:  Faith Ellis is a 30 y.o. female is here for an endoscopy.   Past Medical History:  Diagnosis Date  . Anemia   . Chronic headaches   . Gastric ulcer     Past Surgical History:  Procedure Laterality Date  . c-section    . CHOLECYSTECTOMY    . ESOPHAGOGASTRODUODENOSCOPY N/A 02/01/2016   Procedure: ESOPHAGOGASTRODUODENOSCOPY (EGD);  Surgeon: [name], MD;  Location: [facility] ENDOSCOPY;  Service: Endoscopy;  Laterality: N/A;  . TONSILLECTOMY    . TUBAL LIGATION      Prior to Admission medications   Medication Sig Start Date End Date Taking? Authorizing Provider  pantoprazole (PROTONIX) 40 MG tablet Take 1 tablet (40 mg total) by mouth 2 (two) times daily before a meal. 02/02/16  Yes [name], MD  amitriptyline (ELAVIL) 25 MG tablet Take 1 tablet (25 mg total) by mouth at bedtime. 02/02/16   [name], MD  ferrous sulfate 325 (65 FE) MG tablet Take 1 tablet (325 mg total) by mouth 3 (three) times daily with meals. 02/02/16   [name], MD  sucralfate (CARAFATE) 1 g tablet Take 1 tablet (1 g total) by mouth 4 (four) times daily -  with meals and at bedtime. 02/02/16   [name], MD  traMADol-acetaminophen (ULTRACET) 37.5-325 MG tablet Take 1 tablet by mouth every 8 (eight) hours as needed for moderate pain or severe pain. 02/02/16   [name], MD    Allergies as of 03/26/2016 - Review Complete 02/01/2016  Allergen Reaction Noted  . Keflex [cephalexin] Rash 12/20/2015    History reviewed. No pertinent family history.  Social History   Social History  . Marital status: Single    Spouse name: N/A  . Number of children: N/A  . Years of education: N/A   Occupational History  . Not on file.   Social History Main Topics  . Smoking status: Current Every Day Smoker    Packs/day: 1.00  . Smokeless tobacco: Never Used  . Alcohol use No  . Drug use: No   . Sexual activity: Not on file   Other Topics Concern  . Not on file   Social History Narrative  . No narrative on file    Review of Systems: See HPI, otherwise negative ROS  Physical Exam: BP 118/61   Pulse 81   Temp 97.6 F (36.4 C) (Tympanic)   Resp 20   Ht 5\' 3"  (1.6 m)   Wt 68 kg (150 lb)   LMP 04/07/2016 (Approximate)   SpO2 99%   BMI 26.57 kg/m  General:   Alert,  pleasant and cooperative in NAD Head:  Normocephalic and atraumatic. Neck:  Supple; no masses or thyromegaly. Lungs:  Clear throughout to auscultation.    Heart:  Regular rate and rhythm. Abdomen:  Soft, nontender and nondistended. Normal bowel sounds, without guarding, and without rebound.   Neurologic:  Alert and  oriented x4;  grossly normal neurologically.  Impression/Plan: [name] is here for an endoscopy to be performed for follow up gastric ulcer  Risks, benefits, limitations, and alternatives regarding  endoscopy have been reviewed with the patient.  Questions have been answered.  All parties agreeable.   Gaylyn Cheers, MD  04/25/2016, 1:07 PM

## 2016-04-25 NOTE — Anesthesia Postprocedure Evaluation (Signed)
Anesthesia Post Note  Patient: Faith Ellis  Procedure(s) Performed: Procedure(s) (LRB): ESOPHAGOGASTRODUODENOSCOPY (EGD) WITH PROPOFOL (N/A)  Patient location during evaluation: PACU Anesthesia Type: General Level of consciousness: awake and alert and oriented Pain management: pain level controlled Vital Signs Assessment: post-procedure vital signs reviewed and stable Respiratory status: spontaneous breathing Cardiovascular status: blood pressure returned to baseline Anesthetic complications: no    Last Vitals:  Vitals:   04/25/16 1343 04/25/16 1353  BP: 104/65 101/61  Pulse: 72 66  Resp: 11 (!) 8  Temp:      Last Pain:  Vitals:   04/25/16 1323  TempSrc: Tympanic  PainSc: Asleep                 Aspyn Warnke

## 2016-04-25 NOTE — Anesthesia Preprocedure Evaluation (Signed)
Anesthesia Evaluation  Patient identified by MRN, date of birth, ID band Patient awake    Reviewed: Allergy & Precautions, H&P , NPO status , Patient's Chart, lab work & pertinent test results  History of Anesthesia Complications Negative for: history of anesthetic complications  Airway Mallampati: II  TM Distance: >3 FB Neck ROM: full    Dental  (+) Poor Dentition, Chipped   Pulmonary neg shortness of breath, Current Smoker,    Pulmonary exam normal breath sounds clear to auscultation       Cardiovascular Exercise Tolerance: Good (-) angina(-) DOE Normal cardiovascular exam Rhythm:regular Rate:Normal     Neuro/Psych  Headaches, negative psych ROS   GI/Hepatic negative GI ROS, Neg liver ROS, PUD, neg GERD  ,  Endo/Other  negative endocrine ROS  Renal/GU negative Renal ROS  negative genitourinary   Musculoskeletal   Abdominal   Peds negative pediatric ROS (+)  Hematology negative hematology ROS (+) Blood dyscrasia, anemia ,   Anesthesia Other Findings Past Medical History:   Chronic headaches                                           Past Surgical History:   CHOLECYSTECTOMY                                               c-section                                                     TUBAL LIGATION                                               BMI    Body Mass Index   25.92 kg/m 2      Reproductive/Obstetrics negative OB ROS                             Anesthesia Physical  Anesthesia Plan  ASA: III  Anesthesia Plan: General   Post-op Pain Management:    Induction: Intravenous  Airway Management Planned: Nasal Cannula  Additional Equipment:   Intra-op Plan:   Post-operative Plan:   Informed Consent: I have reviewed the patients History and Physical, chart, labs and discussed the procedure including the risks, benefits and alternatives for the proposed anesthesia with the  patient or authorized representative who has indicated his/her understanding and acceptance.   Dental Advisory Given  Plan Discussed with: Anesthesiologist, CRNA and Surgeon  Anesthesia Plan Comments:         Anesthesia Quick Evaluation                                   Anesthesia Evaluation  Patient identified by MRN, date of birth, ID band Patient awake    Reviewed: Allergy & Precautions, H&P , NPO status , Patient's Chart, lab work & pertinent test results  History  of Anesthesia Complications Negative for: history of anesthetic complications  Airway Mallampati: II  TM Distance: >3 FB Neck ROM: full    Dental  (+) Poor Dentition, Chipped   Pulmonary neg shortness of breath, Current Smoker,    Pulmonary exam normal breath sounds clear to auscultation       Cardiovascular Exercise Tolerance: Good (-) angina+ Past MI  (-) DOE Normal cardiovascular exam Rhythm:regular Rate:Normal     Neuro/Psych  Headaches, negative psych ROS   GI/Hepatic negative GI ROS, Neg liver ROS, neg GERD  ,  Endo/Other  negative endocrine ROS  Renal/GU negative Renal ROS  negative genitourinary   Musculoskeletal   Abdominal   Peds  Hematology negative hematology ROS (+) Blood dyscrasia, anemia ,   Anesthesia Other Findings Past Medical History:   Chronic headaches                                           Past Surgical History:   CHOLECYSTECTOMY                                               c-section                                                     TUBAL LIGATION                                               BMI    Body Mass Index   25.92 kg/m 2      Reproductive/Obstetrics negative OB ROS                             Anesthesia Physical Anesthesia Plan  ASA: III  Anesthesia Plan: General   Post-op Pain Management:    Induction:   Airway Management Planned:   Additional Equipment:   Intra-op Plan:    Post-operative Plan:   Informed Consent: I have reviewed the patients History and Physical, chart, labs and discussed the procedure including the risks, benefits and alternatives for the proposed anesthesia with the patient or authorized representative who has indicated his/her understanding and acceptance.   Dental Advisory Given  Plan Discussed with: Anesthesiologist, CRNA and Surgeon  Anesthesia Plan Comments:         Anesthesia Quick Evaluation

## 2016-04-25 NOTE — Op Note (Signed)
[facility] Patient Name: Faith Ellis Procedure Date: 04/25/2016 1:10 PM MRN: [id number] Account #: [id number] Date of Birth: 09-Jan-1986 Admit Type: Outpatient Age: 30 Room: [facility] ENDO ROOM 1 Gender: Female Note Status: Finalized Procedure:            Upper GI endoscopy Indications:          Follow-up of acute gastric ulcer Providers:            [name], MD Referring MD:         Orvan July. Noell, MD (Referring MD), Peadar G. Noone                        (Referring MD) Medicines:            Propofol per Anesthesia Complications:        No immediate complications. Procedure:            Pre-Anesthesia Assessment:                       - After reviewing the risks and benefits, the patient                        was deemed in satisfactory condition to undergo the                        procedure.                       After obtaining informed consent, the endoscope was                        passed under direct vision. Throughout the procedure,                        the patient's blood pressure, pulse, and oxygen                        saturations were monitored continuously. The Endoscope                        was introduced through the mouth, and advanced to the                        second part of duodenum. The upper GI endoscopy was                        accomplished without difficulty. The patient tolerated                        the procedure well. Findings:      The examined esophagus was normal. GEJ 39cm.      A small hiatal hernia was present.      One non-bleeding superficial gastric ulcer with no stigmata of bleeding       was found in the gastric antrum. The lesion was 1 mm in largest       dimension. Stomach otherwise normal.      The examined duodenum was normal. Impression:           - Normal esophagus.                       -  Small hiatal hernia.                       - Non-bleeding gastric ulcer with no stigmata of                        bleeding.                       - Normal examined duodenum.                       - No specimens collected. Recommendation:       - The findings and recommendations were discussed with                        the patient's family. Continue medication for 2 months                        then change to every other day. [name], MD 04/25/2016 1:23:59 PM This report has been signed electronically. Number of Addenda: 0 Note Initiated On: 04/25/2016 1:10 PM      [facility]

## 2016-04-28 ENCOUNTER — Encounter: Payer: Self-pay | Admitting: Unknown Physician Specialty

## 2016-06-17 ENCOUNTER — Emergency Department
Admission: EM | Admit: 2016-06-17 | Discharge: 2016-06-17 | Disposition: A | Discharge status: Home / Self Care | Payer: Self-pay | Attending: Emergency Medicine | Admitting: Emergency Medicine

## 2016-06-17 DIAGNOSIS — Z79899 Other long term (current) drug therapy: Secondary | ICD-10-CM | POA: Insufficient documentation

## 2016-06-17 DIAGNOSIS — F17200 Nicotine dependence, unspecified, uncomplicated: Secondary | ICD-10-CM | POA: Insufficient documentation

## 2016-06-17 DIAGNOSIS — H9201 Otalgia, right ear: Secondary | ICD-10-CM

## 2016-06-17 DIAGNOSIS — K047 Periapical abscess without sinus: Secondary | ICD-10-CM | POA: Insufficient documentation

## 2016-06-17 LAB — CBC WITH DIFFERENTIAL/PLATELET
BASOS ABS: 0 10*3/uL (ref 0–0.1)
Basophils Relative: 1 %
EOS ABS: 0.2 10*3/uL (ref 0–0.7)
EOS PCT: 4 %
HCT: 37.4 % (ref 35.0–47.0)
Hemoglobin: 12 g/dL (ref 12.0–16.0)
LYMPHS PCT: 27 %
Lymphs Abs: 1.5 10*3/uL (ref 1.0–3.6)
MCH: 23.2 pg — ABNORMAL LOW (ref 26.0–34.0)
MCHC: 32 g/dL (ref 32.0–36.0)
MCV: 72.5 fL — AB (ref 80.0–100.0)
MONO ABS: 0.3 10*3/uL (ref 0.2–0.9)
Monocytes Relative: 6 %
Neutro Abs: 3.3 10*3/uL (ref 1.4–6.5)
Neutrophils Relative %: 62 %
PLATELETS: 189 10*3/uL (ref 150–440)
RBC: 5.15 MIL/uL (ref 3.80–5.20)
RDW: 17.2 % — AB (ref 11.5–14.5)
WBC: 5.4 10*3/uL (ref 3.6–11.0)

## 2016-06-17 MED ORDER — HYDROCODONE-ACETAMINOPHEN 5-325 MG PO TABS: 1.0000 | ORAL_TABLET | ORAL | 0 refills | Status: DC | PRN

## 2016-06-17 MED ORDER — AMOXICILLIN 500 MG PO TABS: 500.0000 mg | ORAL_TABLET | Freq: Two times a day (BID) | ORAL | 0 refills | Status: DC

## 2016-06-17 NOTE — ED Triage Notes (Signed)
Pt to ED via POV from home c/o ear ache. Per pt right ear has been aching for over 1 week. Pt also states she has a broken tooth on her right side as well as a generalized headache. Pt alert and oriented in no acute distress.

## 2016-06-17 NOTE — ED Provider Notes (Signed)
[facility] Emergency Department Provider Note  ____________________________________________  Time seen: Approximately 6:51 PM  I have reviewed the triage vital signs and the nursing notes.   HISTORY  Chief Complaint Otalgia    HPI Faith Ellis is a 30 y.o. female presents for evaluation of right-sided facial ear pain. 2 days progressively getting worse. Patient reports that she broke off a piece of her tooth and now her face is swollen radiation into her ear.   Past Medical History:  Diagnosis Date  . Anemia   . Chronic headaches   . Gastric ulcer     Patient Active Problem List   Diagnosis Date Noted  . Anemia 01/29/2016    Past Surgical History:  Procedure Laterality Date  . c-section    . CHOLECYSTECTOMY    . ESOPHAGOGASTRODUODENOSCOPY N/A 02/01/2016   Procedure: ESOPHAGOGASTRODUODENOSCOPY (EGD);  Surgeon: [name], MD;  Location: [facility] ENDOSCOPY;  Service: Endoscopy;  Laterality: N/A;  . ESOPHAGOGASTRODUODENOSCOPY (EGD) WITH PROPOFOL N/A 04/25/2016   Procedure: ESOPHAGOGASTRODUODENOSCOPY (EGD) WITH PROPOFOL;  Surgeon: [name], MD;  Location: [facility] ENDOSCOPY;  Service: Endoscopy;  Laterality: N/A;  . TONSILLECTOMY    . TUBAL LIGATION      Prior to Admission medications   Medication Sig Start Date End Date Taking? Authorizing Provider  amitriptyline (ELAVIL) 25 MG tablet Take 1 tablet (25 mg total) by mouth at bedtime. 02/02/16   [name], MD  amoxicillin (AMOXIL) 500 MG tablet Take 1 tablet (500 mg total) by mouth 2 (two) times daily. 06/17/16   Arlyss Repress, PA-C  ferrous sulfate 325 (65 FE) MG tablet Take 1 tablet (325 mg total) by mouth 3 (three) times daily with meals. 02/02/16   [name], MD  HYDROcodone-acetaminophen (NORCO) 5-325 MG tablet Take 1-2 tablets by mouth every 4 (four) hours as needed for moderate pain. 06/17/16   [name] Takeshia Wenk, PA-C  pantoprazole (PROTONIX) 40 MG tablet Take 1 tablet (40 mg total) by  mouth 2 (two) times daily before a meal. 02/02/16   [name], MD  sucralfate (CARAFATE) 1 g tablet Take 1 tablet (1 g total) by mouth 4 (four) times daily -  with meals and at bedtime. 02/02/16   [name], MD  traMADol-acetaminophen (ULTRACET) 37.5-325 MG tablet Take 1 tablet by mouth every 8 (eight) hours as needed for moderate pain or severe pain. 02/02/16   [name], MD    Allergies Keflex [cephalexin]  Family History  Problem Relation Age of Onset  . Family history unknown: Yes    Social History Social History  Substance Use Topics  . Smoking status: Current Every Day Smoker    Packs/day: 1.00  . Smokeless tobacco: Never Used  . Alcohol use No    Review of Systems Constitutional: No fever/chills ENT: No sore throat.Positive for right ear pain. As for her tooth pain. Positive for facial swelling. Cardiovascular: Denies chest pain. Respiratory: Denies shortness of breath. Musculoskeletal: Negative for back pain. Skin: Negative for rash. Neurological: Negative for headaches, focal weakness or numbness.  10-point ROS otherwise negative.  ____________________________________________   PHYSICAL EXAM:  VITAL SIGNS: ED Triage Vitals  Enc Vitals Group     BP 06/17/16 1731 116/60     Pulse Rate 06/17/16 1731 75     Resp 06/17/16 1731 18     Temp 06/17/16 1731 98.7 F (37.1 C)     Temp Source 06/17/16 1731 Oral     SpO2 06/17/16 1731 100 %  Weight 06/17/16 1735 150 lb (68 kg)     Height 06/17/16 1735 5\' 3"  (1.6 m)     Head Circumference --      Peak Flow --      Pain Score 06/17/16 1736 4     Pain Loc --      Pain Edu? --      Excl. in [place]? --     Constitutional: Alert and oriented. Well appearing and in no acute distress. Eyes: Conjunctivae are normal. PERRL. EOMI. Head: Atraumatic.Blood noted within the right canal. Nose: No congestion/rhinnorhea. Mouth/Throat: Mucous membranes are moist.  Oropharynx non-erythematous. Neck: No stridor.Positive cervical  adenopathy with facial swelling noted in the right mandibular area.  Cardiovascular: Normal rate, regular rhythm. Grossly normal heart sounds.  Good peripheral circulation. Respiratory: Normal respiratory effort.  No retractions. Lungs CTAB. Musculoskeletal: No lower extremity tenderness nor edema.  No joint effusions. Skin:  Skin is warm, dry and intact. No rash noted. Psychiatric: Mood and affect are normal. Speech and behavior are normal.  ____________________________________________   LABS (all labs ordered are listed, but only abnormal results are displayed)  Labs Reviewed  CBC WITH DIFFERENTIAL/PLATELET - Abnormal; Notable for the following:       Result Value   MCV 72.5 (*)    MCH 23.2 (*)    RDW 17.2 (*)    All other components within normal limits   ____________________________________________  EKG   ____________________________________________  RADIOLOGY   ____________________________________________   PROCEDURES  Procedure(s) performed: None  Critical Care performed: No  ____________________________________________   INITIAL IMPRESSION / ASSESSMENT AND PLAN / ED COURSE  Pertinent labs & imaging results that were available during my care of the patient were reviewed by me and considered in my medical decision making (see chart for details). Review of the [place] CSRS was performed in accordance of the [place] prior to dispensing any controlled drugs.  Dental abscess facial swelling also right otalgia. Rx given for amoxicillin and Vicodin. Patient follow-up PCP or return here if needed.  Clinical Course    ____________________________________________   FINAL CLINICAL IMPRESSION(S) / ED DIAGNOSES  Final diagnoses:  Otalgia of right ear     This chart was dictated using voice recognition software/Dragon. Despite best efforts to proofread, errors can occur which can change the meaning. Any change was purely unintentional.    Arlyss Repress,  PA-C 06/17/16 1929    Eula Listen, MD 06/17/16 1930

## 2016-10-30 ENCOUNTER — Encounter: Payer: Self-pay | Admitting: *Deleted

## 2016-10-30 DIAGNOSIS — B349 Viral infection, unspecified: Secondary | ICD-10-CM | POA: Insufficient documentation

## 2016-10-30 DIAGNOSIS — K047 Periapical abscess without sinus: Secondary | ICD-10-CM | POA: Insufficient documentation

## 2016-10-30 DIAGNOSIS — F17200 Nicotine dependence, unspecified, uncomplicated: Secondary | ICD-10-CM | POA: Insufficient documentation

## 2016-10-30 NOTE — ED Triage Notes (Signed)
Pt has right lower toothache. Pt has bilateral ear pain  Pt took otc meds with some relief.

## 2016-10-31 ENCOUNTER — Emergency Department
Admission: EM | Admit: 2016-10-31 | Discharge: 2016-10-31 | Disposition: A | Discharge status: Home / Self Care | Payer: Self-pay | Attending: Emergency Medicine | Admitting: Emergency Medicine

## 2016-10-31 DIAGNOSIS — K047 Periapical abscess without sinus: Secondary | ICD-10-CM

## 2016-10-31 DIAGNOSIS — B349 Viral infection, unspecified: Secondary | ICD-10-CM

## 2016-10-31 DIAGNOSIS — K0889 Other specified disorders of teeth and supporting structures: Secondary | ICD-10-CM

## 2016-10-31 MED ORDER — AMOXICILLIN 500 MG PO CAPS: 500.0000 mg | ORAL_CAPSULE | Freq: Three times a day (TID) | ORAL | 0 refills | Status: DC

## 2016-10-31 MED ORDER — IBUPROFEN 800 MG PO TABS: 800.0000 mg | ORAL_TABLET | Freq: Three times a day (TID) | ORAL | 0 refills | Status: DC | PRN

## 2016-10-31 MED ORDER — OXYCODONE-ACETAMINOPHEN 5-325 MG PO TABS: 1.0000 | ORAL_TABLET | ORAL | 0 refills | Status: DC | PRN

## 2016-10-31 MED ORDER — AMOXICILLIN 500 MG PO CAPS
500.0000 mg | ORAL_CAPSULE | Freq: Once | ORAL | Status: AC
  Administered 2016-10-31: 500 mg via ORAL
  Filled 2016-10-31: qty 1

## 2016-10-31 MED ORDER — IBUPROFEN 800 MG PO TABS
800.0000 mg | ORAL_TABLET | Freq: Once | ORAL | Status: AC
  Administered 2016-10-31: 800 mg via ORAL
  Filled 2016-10-31: qty 1

## 2016-10-31 MED ORDER — OXYCODONE-ACETAMINOPHEN 5-325 MG PO TABS
1.0000 | ORAL_TABLET | Freq: Once | ORAL | Status: AC
  Administered 2016-10-31: 1 via ORAL
  Filled 2016-10-31: qty 1

## 2016-10-31 NOTE — ED Provider Notes (Signed)
[facility] Emergency Department Provider Note   ____________________________________________   First MD Initiated Contact with Patient 10/31/16 0310     (approximate)  I have reviewed the triage vital signs and the nursing notes.   HISTORY  Chief Complaint Dental Pain    HPI Faith Ellis is a 31 y.o. female who presents to the ED from home with a chief complaint of toothache, ear pain and congestion. Patient reports cracking her right lower molar on a [place] several months ago. Has had pain to her tooth over the past 2 days. Yesterday started with nasal congestion and bilateral ear pain. Denies associated chills, chest pain, shortness breath, abdominal pain, nausea, vomiting, diarrhea. Denies recent travel or trauma. Has taken OTC meds with some relief.   Past Medical History:  Diagnosis Date  . Anemia   . Chronic headaches   . Gastric ulcer     Patient Active Problem List   Diagnosis Date Noted  . Anemia 01/29/2016    Past Surgical History:  Procedure Laterality Date  . c-section    . CHOLECYSTECTOMY    . ESOPHAGOGASTRODUODENOSCOPY N/A 02/01/2016   Procedure: ESOPHAGOGASTRODUODENOSCOPY (EGD);  Surgeon: [name], MD;  Location: [facility] ENDOSCOPY;  Service: Endoscopy;  Laterality: N/A;  . ESOPHAGOGASTRODUODENOSCOPY (EGD) WITH PROPOFOL N/A 04/25/2016   Procedure: ESOPHAGOGASTRODUODENOSCOPY (EGD) WITH PROPOFOL;  Surgeon: [name], MD;  Location: [facility] ENDOSCOPY;  Service: Endoscopy;  Laterality: N/A;  . TONSILLECTOMY    . TUBAL LIGATION      Prior to Admission medications   Medication Sig Start Date End Date Taking? Authorizing Provider  amitriptyline (ELAVIL) 25 MG tablet Take 1 tablet (25 mg total) by mouth at bedtime. 02/02/16   [name], MD  amoxicillin (AMOXIL) 500 MG capsule Take 1 capsule (500 mg total) by mouth 3 (three) times daily. 10/31/16   [name], MD  ferrous sulfate 325 (65 FE) MG tablet Take 1 tablet (325 mg  total) by mouth 3 (three) times daily with meals. 02/02/16   [name], MD  HYDROcodone-acetaminophen (NORCO) 5-325 MG tablet Take 1-2 tablets by mouth every 4 (four) hours as needed for moderate pain. 06/17/16   [name] Beers, PA-C  ibuprofen (ADVIL,MOTRIN) 800 MG tablet Take 1 tablet (800 mg total) by mouth every 8 (eight) hours as needed for moderate pain. 10/31/16   [name], MD  oxyCODONE-acetaminophen (ROXICET) 5-325 MG tablet Take 1 tablet by mouth every 4 (four) hours as needed for severe pain. 10/31/16   [name], MD  pantoprazole (PROTONIX) 40 MG tablet Take 1 tablet (40 mg total) by mouth 2 (two) times daily before a meal. 02/02/16   [name], MD  sucralfate (CARAFATE) 1 g tablet Take 1 tablet (1 g total) by mouth 4 (four) times daily -  with meals and at bedtime. 02/02/16   [name], MD  traMADol-acetaminophen (ULTRACET) 37.5-325 MG tablet Take 1 tablet by mouth every 8 (eight) hours as needed for moderate pain or severe pain. 02/02/16   [name], MD    Allergies Keflex [cephalexin]  Family History  Problem Relation Age of Onset  . Family history unknown: Yes    Social History Social History  Substance Use Topics  . Smoking status: Current Every Day Smoker    Packs/day: 1.00  . Smokeless tobacco: Never Used  . Alcohol use No    Review of Systems  Constitutional: No fever/chills. Eyes: No visual changes. ENT: Positive for dental pain,  nasal congestion and bilateral ear pain. No sore throat. Cardiovascular: Denies chest pain. Respiratory: Denies shortness of breath. Gastrointestinal: No abdominal pain.  No nausea, no vomiting.  No diarrhea.  No constipation. Genitourinary: Negative for dysuria. Musculoskeletal: Negative for back pain. Skin: Negative for rash. Neurological: Negative for headaches, focal weakness or numbness.  10-point ROS otherwise negative.  ____________________________________________   PHYSICAL EXAM:  VITAL SIGNS: ED Triage Vitals  Enc Vitals  Group     BP 10/30/16 2300 136/67     Pulse Rate 10/30/16 2300 74     Resp 10/30/16 2300 18     Temp 10/30/16 2300 98.9 F (37.2 C)     Temp Source 10/30/16 2300 Oral     SpO2 10/30/16 2300 99 %     Weight 10/30/16 2300 150 lb (68 kg)     Height 10/30/16 2300 5\' 3"  (1.6 m)     Head Circumference --      Peak Flow --      Pain Score 10/30/16 2301 6     Pain Loc --      Pain Edu? --      Excl. in [place]? --     Constitutional: Alert and oriented. Well appearing and in no acute distress. Eyes: Conjunctivae are normal. PERRL. EOMI. Head: Atraumatic. Ears: Mild fluid behind bilateral TMs. No erythema or perforation. Nose: Congestion/rhinnorhea. Mouth/Throat: Right lower molar with pre-existing crack. Tender to palpation with tongue blade. There is no intraoral or extraoral swelling. Mucous membranes are moist.  Oropharynx non-erythematous. Neck: No stridor.  Supple neck without meningismus. Hematological/Lymphatic/Immunilogical: No cervical lymphadenopathy. Cardiovascular: Normal rate, regular rhythm. Grossly normal heart sounds.  Good peripheral circulation. Respiratory: Normal respiratory effort.  No retractions. Lungs CTAB. Gastrointestinal: Soft and nontender. No distention. No abdominal bruits. No CVA tenderness. Musculoskeletal: No lower extremity tenderness nor edema.  No joint effusions. Neurologic:  Normal speech and language. No gross focal neurologic deficits are appreciated. No gait instability. Skin:  Skin is warm, dry and intact. No rash noted. Psychiatric: Mood and affect are normal. Speech and behavior are normal.  ____________________________________________   LABS (all labs ordered are listed, but only abnormal results are displayed)  Labs Reviewed - No data to  display ____________________________________________  EKG  None ____________________________________________  RADIOLOGY  None ____________________________________________   PROCEDURES  Procedure(s) performed: None  Procedures  Critical Care performed: No  ____________________________________________   INITIAL IMPRESSION / ASSESSMENT AND PLAN / ED COURSE  Pertinent labs & imaging results that were available during my care of the patient were reviewed by me and considered in my medical decision making (see chart for details).  31 year old female with dentalgia and viral syndrome. Will place on antibiotics, analgesics and she will follow up with dental clinic next week. Strict return precautions given. Patient verbalizes understanding and agrees with plan of care.      ____________________________________________   FINAL CLINICAL IMPRESSION(S) / ED DIAGNOSES  Final diagnoses:  Dental infection  Toothache  Viral illness      NEW MEDICATIONS STARTED DURING THIS VISIT:  New Prescriptions   AMOXICILLIN (AMOXIL) 500 MG CAPSULE    Take 1 capsule (500 mg total) by mouth 3 (three) times daily.   IBUPROFEN (ADVIL,MOTRIN) 800 MG TABLET    Take 1 tablet (800 mg total) by mouth every 8 (eight) hours as needed for moderate pain.   OXYCODONE-ACETAMINOPHEN (ROXICET) 5-325 MG TABLET    Take 1 tablet by mouth every 4 (four) hours as needed for severe pain.  Note:  This document was prepared using Dragon voice recognition software and may include unintentional dictation errors.    [name], MD 10/31/16 [phone]

## 2016-10-31 NOTE — Discharge Instructions (Signed)
1. Take antibiotic as prescribed (Amoxicillin 500mg three times daily x 7 days). °2. Take pain medicines as needed (Motrin/Percocet #15). °3. Return to the ER for worsening symptoms, persistent vomiting, fever, difficulty breathing or other concerns. ° °

## 2017-04-08 ENCOUNTER — Emergency Department
Admission: EM | Admit: 2017-04-08 | Discharge: 2017-04-08 | Disposition: A | Discharge status: Home / Self Care | Payer: Self-pay | Attending: Emergency Medicine | Admitting: Emergency Medicine

## 2017-04-08 ENCOUNTER — Encounter: Payer: Self-pay | Admitting: Emergency Medicine

## 2017-04-08 DIAGNOSIS — H9202 Otalgia, left ear: Secondary | ICD-10-CM | POA: Insufficient documentation

## 2017-04-08 DIAGNOSIS — R51 Headache: Secondary | ICD-10-CM | POA: Insufficient documentation

## 2017-04-08 DIAGNOSIS — Z79899 Other long term (current) drug therapy: Secondary | ICD-10-CM | POA: Insufficient documentation

## 2017-04-08 DIAGNOSIS — F17200 Nicotine dependence, unspecified, uncomplicated: Secondary | ICD-10-CM | POA: Insufficient documentation

## 2017-04-08 DIAGNOSIS — H938X2 Other specified disorders of left ear: Secondary | ICD-10-CM | POA: Insufficient documentation

## 2017-04-08 MED ORDER — AMOXICILLIN 500 MG PO CAPS: 500.0000 mg | ORAL_CAPSULE | Freq: Two times a day (BID) | ORAL | 0 refills | Status: DC

## 2017-04-08 NOTE — ED Notes (Signed)

## 2017-04-08 NOTE — Discharge Instructions (Signed)
Please seek medical attention for any high fevers, chest pain, shortness of breath, change in behavior, persistent vomiting, bloody stool or any other new or concerning symptoms.  

## 2017-04-08 NOTE — ED Notes (Signed)
Pt reports waking up Tuesday with neck pain and left ear pain with nasal drainage. Pt reports hx of ear infections. Pt denies fever. Pt also states "I am anemic and I have had a bad period", pt denies bleeding through multiple pads in an hour. Pt states she feels tired and weak since Saturday. Pt denies N/V/D or cough.

## 2017-04-08 NOTE — ED Triage Notes (Signed)
Patient to ER for c/o left sided ear pain and headache. Patient reports nasal drainage. Reports history of the same with diagnosis of ear infection.

## 2017-04-08 NOTE — ED Provider Notes (Signed)
[facility] Emergency Department Provider Note   ____________________________________________   I have reviewed the triage vital signs and the nursing notes.   HISTORY  Chief Complaint Otalgia and Headache   History limited by: Not Limited   HPI Faith Ellis is a 31 y.o. female who presents to the emergency department today because of concerns for possible left ear infection. Patient states that for the past she has had left ear pain. She states that this pain does go into her left jaw. She has not had any associated fevers. She does have a history of ear infections in the past and states this reminds her of them.    Past Medical History:  Diagnosis Date  . Anemia   . Chronic headaches   . Gastric ulcer     Patient Active Problem List   Diagnosis Date Noted  . Anemia 01/29/2016    Past Surgical History:  Procedure Laterality Date  . c-section    . CHOLECYSTECTOMY    . ESOPHAGOGASTRODUODENOSCOPY N/A 02/01/2016   Procedure: ESOPHAGOGASTRODUODENOSCOPY (EGD);  Surgeon: [name], MD;  Location: [facility] ENDOSCOPY;  Service: Endoscopy;  Laterality: N/A;  . ESOPHAGOGASTRODUODENOSCOPY (EGD) WITH PROPOFOL N/A 04/25/2016   Procedure: ESOPHAGOGASTRODUODENOSCOPY (EGD) WITH PROPOFOL;  Surgeon: [name], MD;  Location: [facility] ENDOSCOPY;  Service: Endoscopy;  Laterality: N/A;  . TONSILLECTOMY    . TUBAL LIGATION      Prior to Admission medications   Medication Sig Start Date End Date Taking? Authorizing Provider  amitriptyline (ELAVIL) 25 MG tablet Take 1 tablet (25 mg total) by mouth at bedtime. 02/02/16   [name], MD  amoxicillin (AMOXIL) 500 MG capsule Take 1 capsule (500 mg total) by mouth 3 (three) times daily. 10/31/16   [name], MD  ferrous sulfate 325 (65 FE) MG tablet Take 1 tablet (325 mg total) by mouth 3 (three) times daily with meals. 02/02/16   [name], MD  HYDROcodone-acetaminophen [facility]) 5-325 MG tablet Take 1-2 tablets by  mouth every 4 (four) hours as needed for moderate pain. 06/17/16   Beers, [name], PA-C  ibuprofen (ADVIL,MOTRIN) 800 MG tablet Take 1 tablet (800 mg total) by mouth every 8 (eight) hours as needed for moderate pain. 10/31/16   [name], MD  oxyCODONE-acetaminophen (ROXICET) 5-325 MG tablet Take 1 tablet by mouth every 4 (four) hours as needed for severe pain. 10/31/16   [name], MD  pantoprazole (PROTONIX) 40 MG tablet Take 1 tablet (40 mg total) by mouth 2 (two) times daily before a meal. 02/02/16   [name], MD  sucralfate (CARAFATE) 1 g tablet Take 1 tablet (1 g total) by mouth 4 (four) times daily -  with meals and at bedtime. 02/02/16   [name], MD  traMADol-acetaminophen (ULTRACET) 37.5-325 MG tablet Take 1 tablet by mouth every 8 (eight) hours as needed for moderate pain or severe pain. 02/02/16   [name], MD    Allergies Keflex [cephalexin]  Family History  Problem Relation Age of Onset  . Family history unknown: Yes    Social History Social History  Substance Use Topics  . Smoking status: Current Every Day Smoker    Packs/day: 1.00  . Smokeless tobacco: Never Used  . Alcohol use No    Review of Systems Constitutional: No fever/chills Eyes: No visual changes. ENT: Positive for left ear pain Cardiovascular: Denies chest pain. Respiratory: Denies shortness of breath. Gastrointestinal: No abdominal pain.  No nausea, no vomiting.  No diarrhea.   Genitourinary: Negative for dysuria. Musculoskeletal: Positive for jaw pain Skin: Negative for rash. Neurological: Positive for headache  ____________________________________________   PHYSICAL EXAM:  VITAL SIGNS: ED Triage Vitals [04/08/17 0149]  Enc Vitals Group     BP 134/68     Pulse Rate 63     Resp 20     Temp 98.4 F (36.9 C)     Temp Source Oral     SpO2 100 %     Weight 165 lb (74.8 kg)     Height 5' 3.5" (1.613 m)     Head Circumference      Peak Flow      Pain Score 4    Constitutional: Alert  and oriented. Well appearing and in no distress. Eyes: Conjunctivae are normal.  ENT   Head: Normocephalic and atraumatic. Left tympanic membrane with some erythema compared to right.   Nose: No congestion/rhinnorhea.   Mouth/Throat: Mucous membranes are moist.   Neck: No stridor. Hematological/Lymphatic/Immunilogical: No cervical lymphadenopathy. Cardiovascular: Normal rate, regular rhythm.  No murmurs, rubs, or gallops. Respiratory: Normal respiratory effort without tachypnea nor retractions. Breath sounds are clear and equal bilaterally. No wheezes/rales/rhonchi. Gastrointestinal: Soft and non tender. No rebound. No guarding.  Genitourinary: Deferred Musculoskeletal: Normal range of motion in all extremities. No lower extremity edema. Neurologic:  Normal speech and language. No gross focal neurologic deficits are appreciated.  Skin:  Skin is warm, dry and intact. No rash noted. Psychiatric: Mood and affect are normal. Speech and behavior are normal. Patient exhibits appropriate insight and judgment.  ____________________________________________    LABS (pertinent positives/negatives)  None  ____________________________________________   EKG  None  ____________________________________________    RADIOLOGY  None   ____________________________________________   PROCEDURES  Procedures  ____________________________________________   INITIAL IMPRESSION / ASSESSMENT AND PLAN / ED COURSE  Pertinent labs & imaging results that were available during my care of the patient were reviewed by me and considered in my medical decision making (see chart for details).  Patient presented to the emergency department today because of concerns for left ear pain and infection. Patient does have some erythema to the left ear. Slightly tender around the ear however no tenderness over the mastoid. Given history and exam will plan on giving the patient antibiotics. Did  discuss with patient importance of following up with primary care.  ____________________________________________   FINAL CLINICAL IMPRESSION(S) / ED DIAGNOSES  Final diagnoses:  Left ear pain     Note: This dictation was prepared with Dragon dictation. Any transcriptional errors that result from this process are unintentional     [name], MD 04/08/17 [phone]

## 2017-07-08 ENCOUNTER — Emergency Department
Admission: EM | Admit: 2017-07-08 | Discharge: 2017-07-08 | Disposition: A | Discharge status: Home / Self Care | Payer: Self-pay | Attending: Emergency Medicine | Admitting: Emergency Medicine

## 2017-07-08 DIAGNOSIS — R238 Other skin changes: Secondary | ICD-10-CM | POA: Insufficient documentation

## 2017-07-08 DIAGNOSIS — Z5321 Procedure and treatment not carried out due to patient leaving prior to being seen by health care provider: Secondary | ICD-10-CM | POA: Insufficient documentation

## 2017-07-08 NOTE — ED Triage Notes (Addendum)
Patient ambulatory to triage with steady gait, without difficulty or distress noted; pt reports has "pimple" beside right nipple intermittently x 52months with itching and now pain, drainage

## 2017-07-09 ENCOUNTER — Telehealth: Payer: Self-pay | Admitting: Emergency Medicine

## 2017-07-09 NOTE — Telephone Encounter (Signed)
Called patient due to lwot to inquire about condition and follow up plans. Home number does not work.  Mobile--person who answered said this was wrong number.  I will send a letter asking patient to call me so I can explain the importance of an exam at this time.

## 2017-10-13 ENCOUNTER — Encounter: Payer: Self-pay | Admitting: Medical Oncology

## 2017-10-13 ENCOUNTER — Emergency Department
Admission: EM | Admit: 2017-10-13 | Discharge: 2017-10-13 | Disposition: A | Discharge status: Home / Self Care | Payer: Self-pay | Attending: Emergency Medicine | Admitting: Emergency Medicine

## 2017-10-13 DIAGNOSIS — N61 Mastitis without abscess: Secondary | ICD-10-CM

## 2017-10-13 DIAGNOSIS — F17200 Nicotine dependence, unspecified, uncomplicated: Secondary | ICD-10-CM | POA: Insufficient documentation

## 2017-10-13 DIAGNOSIS — N611 Abscess of the breast and nipple: Secondary | ICD-10-CM | POA: Insufficient documentation

## 2017-10-13 DIAGNOSIS — Z79899 Other long term (current) drug therapy: Secondary | ICD-10-CM | POA: Insufficient documentation

## 2017-10-13 MED ORDER — CLINDAMYCIN HCL 150 MG PO CAPS
300.0000 mg | ORAL_CAPSULE | Freq: Once | ORAL | Status: AC
  Administered 2017-10-13: 300 mg via ORAL
  Filled 2017-10-13: qty 2

## 2017-10-13 MED ORDER — TRAMADOL HCL 50 MG PO TABS: 50.0000 mg | ORAL_TABLET | Freq: Three times a day (TID) | ORAL | 0 refills | Status: DC | PRN

## 2017-10-13 MED ORDER — TRAMADOL HCL 50 MG PO TABS
100.0000 mg | ORAL_TABLET | Freq: Once | ORAL | Status: AC
  Administered 2017-10-13: 100 mg via ORAL
  Filled 2017-10-13: qty 2

## 2017-10-13 MED ORDER — CLINDAMYCIN HCL 300 MG PO CAPS: 300.0000 mg | ORAL_CAPSULE | Freq: Three times a day (TID) | ORAL | 0 refills | Status: AC

## 2017-10-13 NOTE — ED Triage Notes (Signed)
Pt reports that she has been having pain to rt nipple and discharge for 3-4 months. Pt denies breast feeding. Pt denies fever.

## 2017-10-13 NOTE — Discharge Instructions (Signed)
You appear to have a breast cellulitis and mild abscess. Take the antibiotic as directed. Apply warm compresses to promote healing. Follow-up with the [facility] Department for wound check and follow-up. Return to the ED as needed.

## 2017-10-13 NOTE — ED Notes (Signed)
Patient with pain, redness to right nipple for several months.

## 2017-10-15 ENCOUNTER — Encounter: Payer: Self-pay | Admitting: Physician Assistant

## 2017-10-15 NOTE — ED Provider Notes (Signed)
[name] Emergency Department Provider Note ____________________________________________  Time seen: 1833  I have reviewed the triage vital signs and the nursing notes.  HISTORY  Chief Complaint  Breast Discharge  HPI Faith Ellis is a 32 y.o. female presents to the ED for evaluation of a 3-4 month complaint of intermittent discharge from the nipple base. She admits to being worried about cancer, as her main reason for not presenting earlier. She describes pain, tightness, and intermittent drainage to the right breast. She notes as small hole under the scab she constantlyscratches away. She denies any fevers, chills, sweats, weight change, lumps, or nipple discharge.   Past Medical History:  Diagnosis Date  . Anemia   . Chronic headaches   . Gastric ulcer     Patient Active Problem List   Diagnosis Date Noted  . Anemia 01/29/2016    Past Surgical History:  Procedure Laterality Date  . c-section    . CHOLECYSTECTOMY    . ESOPHAGOGASTRODUODENOSCOPY N/A 02/01/2016   Procedure: ESOPHAGOGASTRODUODENOSCOPY (EGD);  Surgeon: [name], MD;  Location: [facility] ENDOSCOPY;  Service: Endoscopy;  Laterality: N/A;  . ESOPHAGOGASTRODUODENOSCOPY (EGD) WITH PROPOFOL N/A 04/25/2016   Procedure: ESOPHAGOGASTRODUODENOSCOPY (EGD) WITH PROPOFOL;  Surgeon: [name], MD;  Location: [facility] ENDOSCOPY;  Service: Endoscopy;  Laterality: N/A;  . TONSILLECTOMY    . TUBAL LIGATION      Prior to Admission medications   Medication Sig Start Date End Date Taking? Authorizing Provider  amitriptyline (ELAVIL) 25 MG tablet Take 1 tablet (25 mg total) by mouth at bedtime. 02/02/16   [name], MD  amoxicillin (AMOXIL) 500 MG capsule Take 1 capsule (500 mg total) by mouth 2 (two) times daily. 04/08/17   [name], MD  clindamycin (CLEOCIN) 300 MG capsule Take 1 capsule (300 mg total) by mouth 3 (three) times daily for 10 days. 10/13/17 10/23/17  [name], Dannielle Karvonen,  PA-C  ferrous sulfate 325 (65 FE) MG tablet Take 1 tablet (325 mg total) by mouth 3 (three) times daily with meals. 02/02/16   [name], MD  HYDROcodone-acetaminophen [name]) 5-325 MG tablet Take 1-2 tablets by mouth every 4 (four) hours as needed for moderate pain. 06/17/16   Beers, [name], PA-C  ibuprofen (ADVIL,MOTRIN) 800 MG tablet Take 1 tablet (800 mg total) by mouth every 8 (eight) hours as needed for moderate pain. 10/31/16   [name], MD  oxyCODONE-acetaminophen (ROXICET) 5-325 MG tablet Take 1 tablet by mouth every 4 (four) hours as needed for severe pain. 10/31/16   [name], MD  pantoprazole (PROTONIX) 40 MG tablet Take 1 tablet (40 mg total) by mouth 2 (two) times daily before a meal. 02/02/16   [name], MD  sucralfate (CARAFATE) 1 g tablet Take 1 tablet (1 g total) by mouth 4 (four) times daily -  with meals and at bedtime. 02/02/16   [name], MD  traMADol (ULTRAM) 50 MG tablet Take 1 tablet (50 mg total) by mouth 3 (three) times daily as needed for moderate pain. 10/13/17   [name], Dannielle Karvonen, PA-C  traMADol-acetaminophen (ULTRACET) 37.5-325 MG tablet Take 1 tablet by mouth every 8 (eight) hours as needed for moderate pain or severe pain. 02/02/16   [name], MD    Allergies Keflex [cephalexin]  Family History  Family history unknown: Yes    Social History Social History   Tobacco Use  . Smoking status: Current Every Day Smoker    Packs/day: 1.00  .  Smokeless tobacco: Never Used  Substance Use Topics  . Alcohol use: No    Alcohol/week: 0.0 oz  . Drug use: No    Review of Systems  Constitutional: Negative for fever. Cardiovascular: Negative for chest pain. Respiratory: Negative for shortness of breath. Gastrointestinal: Negative for abdominal pain, vomiting and diarrhea. Genitourinary: Negative for dysuria. Musculoskeletal: Negative for back pain. Skin: Negative for rash. Breast drainage as above.  Neurological: Negative for headaches, focal weakness  or numbness. ____________________________________________  PHYSICAL EXAM:  VITAL SIGNS: ED Triage Vitals  Enc Vitals Group     BP 10/13/17 1738 124/68     Pulse Rate 10/13/17 1738 82     Resp 10/13/17 1738 20     Temp 10/13/17 1738 98.6 F (37 C)     Temp Source 10/13/17 1738 Oral     SpO2 10/13/17 1738 100 %     Weight 10/13/17 1739 160 lb (72.6 kg)     Height 10/13/17 1739 5\' 2"  (1.575 m)     Head Circumference --      Peak Flow --      Pain Score 10/13/17 1756 4     Pain Loc --      Pain Edu? --      Excl. in [place]? --     Constitutional: Alert and oriented. Well appearing and in no distress. Head: Normocephalic and atraumatic. Hematological/Lymphatic/Immunological: No cervical, or supra-/infraclavicular lymphadenopathy. Cardiovascular: Normal rate, regular rhythm. Normal distal pulses. Respiratory: Normal respiratory effort. No wheezes/rales/rhonchi. Breast: right breast without obvious edema, erythema, warmth or induration. No nipple retraction, peau' d orange, or puckering. A small pinhole is noted at the 3 o'clock position at the nipple base within the areola. Milky, purulent drainage is expressed with manipulation after a small, honey-colored scab is dislodged. No overlying skin changes noted. No nipple discharge noted. Fibrocystic breast change noted on palpation of the remainder of the breast.  Musculoskeletal: Nontender with normal range of motion in all extremities.  Neurologic:  Normal gait without ataxia. Normal speech and language. No gross focal neurologic deficits are appreciated. Skin:  Skin is warm, dry and intact. No rash noted. ____________________________________________  LABORATORY  Labs Reviewed  AEROBIC CULTURE (SUPERFICIAL SPECIMEN)  ____________________________________________  PROCEDURES  Procedures Clindamycin 300 mg PO Ultram 50 mg PO ____________________________________________  INITIAL IMPRESSION / ASSESSMENT AND PLAN / ED  COURSE  Patient with ED evaluation of right breast discharge. Her exam reveals a small draining abscess to the NAC without signs of induration. She is placed on clindamycin for MSSA/MRSA coverage. She is referred to the [facility] for further evaluation. Return precautions have been reviewed.  ____________________________________________  FINAL CLINICAL IMPRESSION(S) / ED DIAGNOSES  Final diagnoses:  Breast abscess  Cellulitis of right breast     Carmie End, Dannielle Karvonen, PA-C 10/15/17 1828    Nena Polio, MD 10/16/17 2148

## 2017-10-16 LAB — AEROBIC CULTURE  (SUPERFICIAL SPECIMEN)
CULTURE: NORMAL
SPECIAL REQUESTS: NORMAL

## 2017-10-30 ENCOUNTER — Other Ambulatory Visit: Payer: Self-pay

## 2017-10-30 ENCOUNTER — Emergency Department
Admission: EM | Admit: 2017-10-30 | Discharge: 2017-10-30 | Disposition: A | Discharge status: Home / Self Care | Payer: Self-pay | Attending: Emergency Medicine | Admitting: Emergency Medicine

## 2017-10-30 ENCOUNTER — Encounter: Payer: Self-pay | Admitting: *Deleted

## 2017-10-30 DIAGNOSIS — J019 Acute sinusitis, unspecified: Secondary | ICD-10-CM | POA: Insufficient documentation

## 2017-10-30 DIAGNOSIS — F17200 Nicotine dependence, unspecified, uncomplicated: Secondary | ICD-10-CM | POA: Insufficient documentation

## 2017-10-30 DIAGNOSIS — Z79899 Other long term (current) drug therapy: Secondary | ICD-10-CM | POA: Insufficient documentation

## 2017-10-30 LAB — INFLUENZA PANEL BY PCR (TYPE A & B)
INFLAPCR: NEGATIVE
INFLBPCR: NEGATIVE

## 2017-10-30 LAB — GROUP A STREP BY PCR: Group A Strep by PCR: NOT DETECTED

## 2017-10-30 MED ORDER — AMOXICILLIN-POT CLAVULANATE 875-125 MG PO TABS: 1.0000 | ORAL_TABLET | Freq: Two times a day (BID) | ORAL | 0 refills | Status: AC

## 2017-10-30 MED ORDER — AMOXICILLIN-POT CLAVULANATE 875-125 MG PO TABS
1.0000 | ORAL_TABLET | Freq: Once | ORAL | Status: AC
  Administered 2017-10-30: 1 via ORAL
  Filled 2017-10-30: qty 1

## 2017-10-30 NOTE — ED Triage Notes (Signed)
Pt brought her daughter in to be seen and while in the room she decided that she also wanted to be seen since her daughter tested positive for strep. NAD sitting comfortably at bedside with no specific complaints. Does state her throat is sore and scratchy

## 2017-10-30 NOTE — ED Provider Notes (Signed)
[facility] Emergency Department Provider Note  ____________________________________________   First MD Initiated Contact with Patient 10/30/17 0300     (approximate)  I have reviewed the triage vital signs and the nursing notes.   HISTORY  Chief Complaint Sore Throat    HPI Faith Ellis is a 32 y.o. female with below list of chronic medical conditions presents to the emergency department with nonproductive cough nasal congestion bilateral ear pain right worse than left and sore throat since Monday (4 days).  Patient denies any fever afebrile with a temperature 98.6.  Patient does admit to smoking 1 pack of cigarettes per day.  Past Medical History:  Diagnosis Date  . Anemia   . Chronic headaches   . Gastric ulcer     Patient Active Problem List   Diagnosis Date Noted  . Anemia 01/29/2016    Past Surgical History:  Procedure Laterality Date  . c-section    . CHOLECYSTECTOMY    . ESOPHAGOGASTRODUODENOSCOPY N/A 02/01/2016   Procedure: ESOPHAGOGASTRODUODENOSCOPY (EGD);  Surgeon: [name], MD;  Location: [facility] ENDOSCOPY;  Service: Endoscopy;  Laterality: N/A;  . ESOPHAGOGASTRODUODENOSCOPY (EGD) WITH PROPOFOL N/A 04/25/2016   Procedure: ESOPHAGOGASTRODUODENOSCOPY (EGD) WITH PROPOFOL;  Surgeon: [name], MD;  Location: [facility] ENDOSCOPY;  Service: Endoscopy;  Laterality: N/A;  . TONSILLECTOMY    . TUBAL LIGATION      Prior to Admission medications   Medication Sig Start Date End Date Taking? Authorizing Provider  amitriptyline (ELAVIL) 25 MG tablet Take 1 tablet (25 mg total) by mouth at bedtime. 02/02/16   [name], MD  amoxicillin (AMOXIL) 500 MG capsule Take 1 capsule (500 mg total) by mouth 2 (two) times daily. 04/08/17   [name], MD  amoxicillin-clavulanate (AUGMENTIN) 875-125 MG tablet Take 1 tablet by mouth 2 (two) times daily for 10 days. 10/30/17 11/09/17  Gregor Hams, MD  ferrous sulfate 325 (65 FE) MG tablet Take  1 tablet (325 mg total) by mouth 3 (three) times daily with meals. 02/02/16   [name], MD  HYDROcodone-acetaminophen [facility]) 5-325 MG tablet Take 1-2 tablets by mouth every 4 (four) hours as needed for moderate pain. 06/17/16   Beers, [name], PA-C  ibuprofen (ADVIL,MOTRIN) 800 MG tablet Take 1 tablet (800 mg total) by mouth every 8 (eight) hours as needed for moderate pain. 10/31/16   [name], MD  oxyCODONE-acetaminophen (ROXICET) 5-325 MG tablet Take 1 tablet by mouth every 4 (four) hours as needed for severe pain. 10/31/16   [name], MD  pantoprazole (PROTONIX) 40 MG tablet Take 1 tablet (40 mg total) by mouth 2 (two) times daily before a meal. 02/02/16   [name], MD  sucralfate (CARAFATE) 1 g tablet Take 1 tablet (1 g total) by mouth 4 (four) times daily -  with meals and at bedtime. 02/02/16   [name], MD  traMADol (ULTRAM) 50 MG tablet Take 1 tablet (50 mg total) by mouth 3 (three) times daily as needed for moderate pain. 10/13/17   Menshew, Dannielle Karvonen, PA-C  traMADol-acetaminophen (ULTRACET) 37.5-325 MG tablet Take 1 tablet by mouth every 8 (eight) hours as needed for moderate pain or severe pain. 02/02/16   [name], MD    Allergies Keflex [cephalexin]  Family History  Family history unknown: Yes    Social History Social History   Tobacco Use  . Smoking status: Current Every Day Smoker    Packs/day: 1.00  . Smokeless tobacco: Never Used  Substance Use Topics  . Alcohol use: No    Alcohol/week: 0.0 oz  . Drug use: No    Review of Systems Constitutional: No fever/chills Eyes: No visual changes. ENT:   Positive for nasal congestion and sore throat positive for bilateral ear pain Cardiovascular: Denies chest pain. Respiratory: Denies shortness of breath.  Positive for cough Gastrointestinal: No abdominal pain.  No nausea, no vomiting.  No diarrhea.  No constipation. Genitourinary: Negative for dysuria. Musculoskeletal: Negative for neck pain.  Negative for back  pain. Integumentary: Negative for rash. Neurological: Negative for headaches, focal weakness or numbness.   ____________________________________________   PHYSICAL EXAM:  VITAL SIGNS: ED Triage Vitals  Enc Vitals Group     BP 10/30/17 0344 125/61     Pulse Rate 10/30/17 0344 68     Resp 10/30/17 0344 17     Temp 10/30/17 0344 98.6 F (37 C)     Temp src --      SpO2 10/30/17 0344 100 %     Weight 10/30/17 0335 72.6 kg (160 lb)     Height 10/30/17 0335 1.575 m (5\' 2" )     Head Circumference --      Peak Flow --      Pain Score 10/30/17 0335 2     Pain Loc --      Pain Edu? --      Excl. in [place]? --     Constitutional: Alert and oriented. Well appearing and in no acute distress. Eyes: Conjunctivae are normal.  Head: Atraumatic. Ears:  Healthy appearing ear canals and bulging bilateral TM right greater than left with clear fluid noted posteriorly Nose: No congestion/rhinnorhea.  Pain with maxillary sinus percussion Mouth/Throat: Mucous membranes are moist. Oropharynx non-erythematous. Neck: No stridor.   Cardiovascular: Normal rate, regular rhythm. Good peripheral circulation. Grossly normal heart sounds. Respiratory: Normal respiratory effort.  No retractions. Lungs CTAB. Gastrointestinal: Soft and nontender. No distention.  Musculoskeletal: No lower extremity tenderness nor edema. No gross deformities of extremities. Neurologic:  Normal speech and language. No gross focal neurologic deficits are appreciated.  Skin:  Skin is warm, dry and intact. No rash noted. Psychiatric: Mood and affect are normal. Speech and behavior are normal.  ____________________________________________   LABS (all labs ordered are listed, but only abnormal results are displayed)  Labs Reviewed  GROUP A STREP BY PCR  INFLUENZA PANEL BY PCR (TYPE A & B)    Procedures   ____________________________________________   INITIAL IMPRESSION / ASSESSMENT AND PLAN / ED COURSE  As part of my  medical decision making, I reviewed the following data within the electronic MEDICAL RECORD [id number] year old female present with above-stated history and physical exam consider possibly strep pharyngitis as the patient's daughter currently has strep throat.  Rapid strep negative.  Consider the possibility of influenza however influenza negative.  Suspect sinusitis as the etiology for the patient's symptoms.  Patient prescribed Augmentin. ____________________________________________  FINAL CLINICAL IMPRESSION(S) / ED DIAGNOSES  Final diagnoses:  Acute non-recurrent sinusitis, unspecified location     MEDICATIONS GIVEN DURING THIS VISIT:  Medications  amoxicillin-clavulanate (AUGMENTIN) 875-125 MG per tablet 1 tablet (1 tablet Oral Given 10/30/17 0434)     ED Discharge Orders        Ordered    amoxicillin-clavulanate (AUGMENTIN) 875-125 MG tablet  2 times daily     10/30/17 0430       Note:  This document was prepared using Dragon voice recognition software and may include unintentional dictation errors.  Gregor Hams, MD 10/30/17 [phone]

## 2018-03-05 ENCOUNTER — Encounter: Payer: Self-pay | Admitting: Emergency Medicine

## 2018-03-05 ENCOUNTER — Emergency Department
Admission: EM | Admit: 2018-03-05 | Discharge: 2018-03-05 | Disposition: A | Discharge status: Home / Self Care | Payer: Self-pay | Attending: Emergency Medicine | Admitting: Emergency Medicine

## 2018-03-05 ENCOUNTER — Other Ambulatory Visit: Payer: Self-pay

## 2018-03-05 DIAGNOSIS — Y999 Unspecified external cause status: Secondary | ICD-10-CM | POA: Insufficient documentation

## 2018-03-05 DIAGNOSIS — Y9389 Activity, other specified: Secondary | ICD-10-CM | POA: Insufficient documentation

## 2018-03-05 DIAGNOSIS — Y92410 Unspecified street and highway as the place of occurrence of the external cause: Secondary | ICD-10-CM | POA: Insufficient documentation

## 2018-03-05 DIAGNOSIS — S46912A Strain of unspecified muscle, fascia and tendon at shoulder and upper arm level, left arm, initial encounter: Secondary | ICD-10-CM | POA: Insufficient documentation

## 2018-03-05 DIAGNOSIS — F17200 Nicotine dependence, unspecified, uncomplicated: Secondary | ICD-10-CM | POA: Insufficient documentation

## 2018-03-05 DIAGNOSIS — Z79899 Other long term (current) drug therapy: Secondary | ICD-10-CM | POA: Insufficient documentation

## 2018-03-05 MED ORDER — NAPROXEN 500 MG PO TABS: 500.0000 mg | ORAL_TABLET | Freq: Two times a day (BID) | ORAL | 0 refills | Status: AC

## 2018-03-05 MED ORDER — NAPROXEN 500 MG PO TABS
500.0000 mg | ORAL_TABLET | Freq: Once | ORAL | Status: AC
  Administered 2018-03-05: 500 mg via ORAL
  Filled 2018-03-05: qty 1

## 2018-03-05 MED ORDER — CYCLOBENZAPRINE HCL 10 MG PO TABS
10.0000 mg | ORAL_TABLET | Freq: Once | ORAL | Status: AC
  Administered 2018-03-05: 10 mg via ORAL
  Filled 2018-03-05: qty 1

## 2018-03-05 MED ORDER — CYCLOBENZAPRINE HCL 5 MG PO TABS: 5.0000 mg | ORAL_TABLET | Freq: Three times a day (TID) | ORAL | 0 refills | Status: DC | PRN
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
## 2018-03-11 ENCOUNTER — Encounter: Payer: Self-pay | Admitting: Emergency Medicine

## 2018-03-11 ENCOUNTER — Emergency Department
Admission: EM | Admit: 2018-03-11 | Discharge: 2018-03-11 | Disposition: A | Discharge status: Home / Self Care | Payer: Self-pay | Attending: Student in an Organized Health Care Education/Training Program | Admitting: Student in an Organized Health Care Education/Training Program

## 2018-03-11 DIAGNOSIS — Z79899 Other long term (current) drug therapy: Secondary | ICD-10-CM | POA: Insufficient documentation

## 2018-03-11 DIAGNOSIS — Y9389 Activity, other specified: Secondary | ICD-10-CM | POA: Insufficient documentation

## 2018-03-11 DIAGNOSIS — Y92410 Unspecified street and highway as the place of occurrence of the external cause: Secondary | ICD-10-CM | POA: Insufficient documentation

## 2018-03-11 DIAGNOSIS — M62838 Other muscle spasm: Secondary | ICD-10-CM | POA: Insufficient documentation

## 2018-03-11 DIAGNOSIS — Y998 Other external cause status: Secondary | ICD-10-CM | POA: Insufficient documentation

## 2018-03-11 DIAGNOSIS — S161XXA Strain of muscle, fascia and tendon at neck level, initial encounter: Secondary | ICD-10-CM | POA: Insufficient documentation

## 2018-03-11 DIAGNOSIS — Z9049 Acquired absence of other specified parts of digestive tract: Secondary | ICD-10-CM | POA: Insufficient documentation

## 2018-03-11 DIAGNOSIS — F17200 Nicotine dependence, unspecified, uncomplicated: Secondary | ICD-10-CM | POA: Insufficient documentation

## 2018-03-11 MED ORDER — METAXALONE 800 MG PO TABS: 800.0000 mg | ORAL_TABLET | Freq: Three times a day (TID) | ORAL | 0 refills | Status: AC
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
## 2018-06-05 ENCOUNTER — Encounter: Payer: Self-pay | Admitting: Emergency Medicine

## 2018-06-05 ENCOUNTER — Emergency Department
Admission: EM | Admit: 2018-06-05 | Discharge: 2018-06-05 | Disposition: A | Discharge status: Home / Self Care | Payer: Self-pay | Attending: Emergency Medicine | Admitting: Emergency Medicine

## 2018-06-05 ENCOUNTER — Other Ambulatory Visit: Payer: Self-pay

## 2018-06-05 DIAGNOSIS — Z79899 Other long term (current) drug therapy: Secondary | ICD-10-CM | POA: Insufficient documentation

## 2018-06-05 DIAGNOSIS — K047 Periapical abscess without sinus: Secondary | ICD-10-CM | POA: Insufficient documentation

## 2018-06-05 DIAGNOSIS — F17210 Nicotine dependence, cigarettes, uncomplicated: Secondary | ICD-10-CM | POA: Insufficient documentation

## 2018-06-05 MED ORDER — HYDROCODONE-ACETAMINOPHEN 5-325 MG PO TABS: 1.0000 | ORAL_TABLET | ORAL | 0 refills | Status: DC | PRN

## 2018-06-05 MED ORDER — DOXYCYCLINE HYCLATE 100 MG PO TABS
100.0000 mg | ORAL_TABLET | Freq: Once | ORAL | Status: AC
Start: 2018-06-05 — End: 2018-06-05
  Administered 2018-06-05: 100 mg via ORAL
  Filled 2018-06-05: qty 1

## 2018-06-05 MED ORDER — HYDROCODONE-ACETAMINOPHEN 5-325 MG PO TABS
1.0000 | ORAL_TABLET | Freq: Once | ORAL | Status: AC
  Administered 2018-06-05: 1 via ORAL
  Filled 2018-06-05: qty 1

## 2018-06-05 MED ORDER — MAGIC MOUTHWASH W/LIDOCAINE: 5.0000 mL | Freq: Four times a day (QID) | ORAL | 0 refills | Status: DC

## 2018-06-05 MED ORDER — DOXYCYCLINE HYCLATE 100 MG PO TABS: 100.0000 mg | ORAL_TABLET | Freq: Two times a day (BID) | ORAL | 0 refills | Status: DC

## 2018-06-05 NOTE — Discharge Instructions (Signed)
OPTIONS FOR DENTAL FOLLOW UP CARE ° °[place] Department of Health and Human Services - Local Safety Net Dental Clinics °http://www.ncdhhs.gov/dph/oralhealth/services/safetynetclinics.htm °  °[facility] (336-562-3123) ° °[facility] [place] ([phone]) ° °[facility] ([phone] ext [phone]) ° °[facility] ([phone]) ° °[facility] ([phone]) °This clinic caters to the indigent population and is on a lottery system. °Location: °[facility], [address], [place] °Clinic Hours: °Wednesdays from 6pm - 9pm, patients seen by a lottery system. °For dates, call or go to [email or web address]/[facility]/[email or web address] °Services: °Cleanings, fillings and simple extractions. °Payment Options: °DENTAL WORK IS FREE OF CHARGE. Bring proof of income or support. °Best way to get seen: °Arrive at 5:15 pm - this is a lottery, NOT first come/first serve, so arriving earlier will not increase your chances of being seen. °  °  °[facility] °[phone] °Select option 1 for emergencies °  °Location: °[facility], [address], [place] °Clinic Hours: °No walk-ins accepted - call the day before to schedule an appointment. °Check in times are 9:30 am and 1:30 pm. °Services: °Simple extractions, temporary fillings, pulpectomy/pulp debridement, uncomplicated abscess drainage. °Payment Options: °PAYMENT IS DUE AT THE TIME OF SERVICE.  Fee is usually $100-200, additional surgical procedures (e.g. abscess drainage) may be extra. °Cash, checks, Visa/MasterCard accepted.  Can file Medicaid if patient is covered for dental - patient should call case worker to check. °No discount for [facility] patients. °Best way to get seen: °MUST call the day before and get onto the schedule. Can usually be seen the next 1-2 days. No walk-ins accepted. °  °  °[facility] °[phone] °   °Location: °[facility], [address], [place] °Clinic Hours: °M, W, Th, F 8am or 1:30pm, Tues 9a or 1:30 - first come/first served. °Services: °Simple extractions, temporary fillings, uncomplicated abscess drainage.  You do not need to be an [place] resident. °Payment Options: °PAYMENT IS DUE AT THE TIME OF SERVICE. °Dental insurance, otherwise sliding scale - bring proof of income or support. °Depending on income and treatment needed, cost is usually $50-200. °Best way to get seen: °Arrive early as it is first come/first served. °  °  °[facility] °[phone] °  °Location: °[address] °Clinic Hours: °Mon-Thu 8a-5p °Services: °Most basic dental services including extractions and fillings. °Payment Options: °PAYMENT IS DUE AT THE TIME OF SERVICE. °Sliding scale, up to 50% off - bring proof if income or support. °Medicaid with dental option accepted. °Best way to get seen: °Call to schedule an appointment, can usually be seen within 2 weeks OR they will try to see walk-ins - show up at 8a or 2p (you may have to wait). °  °  °[facility] °919-245-2435 °[place] RESIDENTS ONLY °  °Location: °[facility], [address], [place], [place] [address] °Clinic Hours: By appointment only. °Monday - Thursday 8am-5pm, Friday 8am-12pm °Services: Cleanings, fillings, extractions. °Payment Options: °PAYMENT IS DUE AT THE TIME OF SERVICE. °Cash, Visa or MasterCard. Sliding scale - $30 minimum per service. °Best way to get seen: °Come in to office, complete packet and make an appointment - need proof of income °or support monies for each household member and proof of [place] residence. °Usually takes about a month to get in. °  °  °[facility] °[phone] °  °Location: °[address]   [place] °Clinic Hours: Walk-in Urgent Care Dental Services are offered Monday-Friday  mornings only. °The numbers of emergencies accepted daily is limited to the number of °providers available. °Maximum 15 - Mondays, Wednesdays & Thursdays °Maximum 10 - Tuesdays & Fridays °Services: °You do not need to be a [place] resident to be seen for a dental emergency. °Emergencies are defined as pain, swelling, abnormal bleeding, or dental trauma. Walkins will receive x-rays if needed. °NOTE: Dental cleaning is not an emergency. °Payment Options: °PAYMENT IS DUE AT THE TIME OF SERVICE. °Minimum co-pay is $40.00 for uninsured patients. °Minimum co-pay is $3.00 for Medicaid with dental coverage. °Dental Insurance is accepted and must be presented at time of visit. °Medicare does not cover dental. °Forms of payment: Cash, credit card, checks. °Best way to get seen: °If not previously registered with the clinic, walk-in dental registration begins at 7:15 am and is on a first come/first serve basis. °If previously registered with the clinic, call to make an appointment. °  °  °[facility] °[phone] °[place] RESIDENTS ONLY °  °Location: °[address], [place], [place] °Clinic Hours: °Mon-Thu 10a-2p °Services: Extractions only! °Payment Options: °FREE (donations accepted) - bring proof of income or support °Best way to get seen: °Call and schedule an appointment OR come at 8am on the 1st Monday of every month (except for holidays) when it is first come/first served. °  °  °[facility] °[phone] °  °Location: °[address], [place] °Clinic Hours: °Friday mornings °Services, Payment Options, Best way to get seen: °Call for info °
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
## 2018-06-05 NOTE — ED Triage Notes (Signed)
C/o right lower dental pain. Has a cracked tooth she needs pulled. No dentist or insurance. Handling secretions. NAD

## 2018-06-05 NOTE — ED Provider Notes (Signed)
[facility] Provider Note  ____________________________________________  Time seen: Approximately 7:09 PM  I have reviewed the triage vital signs and the nursing notes.   HISTORY  Chief Complaint Dental Pain    HPI [facility] is a 32 y.o. female endorses the emergency department complaining of a cracked tooth to the right lower dentition. Patient reports that she has accompanying pain. Injury occurred several months ago but she has had increasing pain and swelling to the area. She reports that the pain radiates up into her right ear. No difficulty breathing or swallowing. No fevers or chills. Patient has not tried any medications for this complaint prior to arrival.    Past Medical History:  Diagnosis Date  . Anemia   . Chronic headaches   . Gastric ulcer     Patient Active Problem List   Diagnosis Date Noted  . Anemia 01/29/2016    Past Surgical History:  Procedure Laterality Date  . c-section    . CHOLECYSTECTOMY    . ESOPHAGOGASTRODUODENOSCOPY N/A 02/01/2016   Procedure: ESOPHAGOGASTRODUODENOSCOPY (EGD);  Surgeon: [name], MD;  Location: [facility];  Service: Endoscopy;  Laterality: N/A;  . ESOPHAGOGASTRODUODENOSCOPY (EGD) WITH PROPOFOL N/A 04/25/2016   Procedure: ESOPHAGOGASTRODUODENOSCOPY (EGD) WITH PROPOFOL;  Surgeon: [name], MD;  Location[facility] ENDOSCOPY;  Service: Endoscopy;  Laterality: N/A;  . TONSILLECTOMY    . TUBAL LIGATION      Prior to Admission medications   Medication Sig Start Date End Date Taking? Authorizing Provider  amitriptyline (ELAVIL) 25 MG tablet Take 1 tablet (25 mg total) by mouth at bedtime. 02/02/16   [name], MD  cyclobenzaprine (FLEXERIL) 5 MG tablet Take 1 tablet (5 mg total) by mouth 3 (three) times daily as needed for muscle spasms. 03/05/18   Menshew, Dannielle Karvonen, PA-C  doxycycline (VIBRA-TABS) 100 MG tablet Take 1 tablet (100 mg total) by mouth 2 (two) times  daily. 06/05/18   Esha Fincher, Charline Bills, PA-C  ferrous sulfate 325 (65 FE) MG tablet Take 1 tablet (325 mg total) by mouth 3 (three) times daily with meals. 02/02/16   [name], MD  HYDROcodone-acetaminophen (NORCO/VICODIN) 5-325 MG tablet Take 1 tablet by mouth every 4 (four) hours as needed for moderate pain. 06/05/18   Talina Pleitez, Charline Bills, PA-C  magic mouthwash w/lidocaine SOLN Take 5 mLs by mouth 4 (four) times daily. 06/05/18   Juddson Cobern, Charline Bills, PA-C  pantoprazole (PROTONIX) 40 MG tablet Take 1 tablet (40 mg total) by mouth 2 (two) times daily before a meal. 02/02/16   [name], MD    Allergies Keflex [cephalexin]  Family History  Family history unknown: Yes    Social History Social History   Tobacco Use  . Smoking status: Current Every Day Smoker    Packs/day: 1.00  . Smokeless tobacco: Never Used  Substance Use Topics  . Alcohol use: No    Alcohol/week: 0.0 standard drinks  . Drug use: No     Review of Systems  Constitutional: No fever/chills Eyes: No visual changes. No discharge ENT: positive for right dental pain with pain radiating into her right ear. Cardiovascular: no chest pain. Respiratory: no cough. No SOB. Gastrointestinal: No abdominal pain.  No nausea, no vomiting.  No diarrhea.  No constipation. Musculoskeletal: Negative for musculoskeletal pain. Skin: Negative for rash, abrasions, lacerations, ecchymosis. Neurological: Negative for headaches, focal weakness or numbness. 10-point ROS otherwise negative.  ____________________________________________   PHYSICAL EXAM:  VITAL SIGNS: ED Triage Vitals  Enc Vitals Group     BP 06/05/18 1727 127/79     Pulse Rate 06/05/18 1727 82     Resp 06/05/18 1727 18     Temp 06/05/18 1726 98.5 F (36.9 C)     Temp Source 06/05/18 1726 Oral     SpO2 06/05/18 1727 100 %     Weight 06/05/18 1726 178 lb 9.2 oz (81 kg)     Height 06/05/18 1726 5\' 2"  (1.575 m)     Head Circumference --      Peak Flow --      Pain  Score 06/05/18 1726 6     Pain Loc --      Pain Edu? --      Excl. in [place]? --      Constitutional: Alert and oriented. Well appearing and in no acute distress. Eyes: Conjunctivae are normal. PERRL. EOMI. Head: Atraumatic. ENT:      Ears: EAC is unremarkable bilaterally. TM on right is minimally bulging, no air fluid level. No injection or duskiness.      Nose: No congestion/rhinnorhea.      Mouth/Throat: Mucous membranes are moist. Visualization of her dentition reveals #32 tooth with fracture. Surrounding erythema and edema consistent with infection. No drainage or purulence. Patient has a few other scattered caries however overall dentition is intact. Uvula is midline. No indication of deep underlying abscess or deep space infection to include Ludwig angina. Neck: No stridor. Supple full range of motion Hematological/Lymphatic/Immunilogical: No cervical lymphadenopathy. Cardiovascular: Normal rate, regular rhythm. Normal S1 and S2.  Good peripheral circulation. Respiratory: Normal respiratory effort without tachypnea or retractions. Lungs CTAB. Good air entry to the bases with no decreased or absent breath sounds. Musculoskeletal: Full range of motion to all extremities. No gross deformities appreciated. Neurologic:  Normal speech and language. No gross focal neurologic deficits are appreciated.  Skin:  Skin is warm, dry and intact. No rash noted. Psychiatric: Mood and affect are normal. Speech and behavior are normal. Patient exhibits appropriate insight and judgement.   ____________________________________________   LABS (all labs ordered are listed, but only abnormal results are displayed)  Labs Reviewed - No data to display ____________________________________________  EKG   ____________________________________________  RADIOLOGY   No results found.  ____________________________________________    PROCEDURES  Procedure(s) performed:     Procedures    Medications - No data to display   ____________________________________________   INITIAL IMPRESSION / ASSESSMENT AND PLAN / ED COURSE  Pertinent labs & imaging results that were available during my care of the patient were reviewed by me and considered in my medical decision making (see chart for details).  Review of the [place] CSRS was performed in accordance of the [place] prior to dispensing any controlled drugs.      Patient's diagnosis is consistent with in all infection around a previously fractured tooth. No indication of deep space infection. Patient presented with complaints of right ear pain, right ear pain. Exam is consistent with dental infection. Differential includes fractured dentition, dental abscess, Ludwig angina, for general neuralgia, otitis media, otitis externa.patient will be prescribed antibiotics, magic mouthwash for symptom control, #8 of Vicodin. Patient is allergic to cephalosporins, as such doxycycline as antibiotic.Marland Kitchen Patient is given a list of dental options, she is to follow-up with dentist. Patient is given ED precautions to return to the ED for any worsening or new symptoms.     ____________________________________________  FINAL CLINICAL IMPRESSION(S) / ED DIAGNOSES  Final diagnoses:  Dental infection  NEW MEDICATIONS STARTED DURING THIS VISIT:  ED Discharge Orders         Ordered    doxycycline (VIBRA-TABS) 100 MG tablet  2 times daily     06/05/18 1912    magic mouthwash w/lidocaine SOLN  4 times daily    Note to Pharmacy:  Dispense in a 1/1/1 ratio. Use lidocaine, diphenhydramine, prednisolone   06/05/18 1912    HYDROcodone-acetaminophen (NORCO/VICODIN) 5-325 MG tablet  Every 4 hours PRN     06/05/18 1912              This chart was dictated using voice recognition software/Dragon. Despite best efforts to proofread, errors can occur which can change the meaning. Any change was purely unintentional.     Darletta Moll, PA-C 06/05/18 1916    Lavonia Drafts, MD 06/05/18 [phone]

## 2018-06-05 NOTE — ED Notes (Signed)

## 2018-10-05 ENCOUNTER — Other Ambulatory Visit: Payer: Self-pay

## 2018-10-05 ENCOUNTER — Emergency Department
Admission: EM | Admit: 2018-10-05 | Discharge: 2018-10-05 | Disposition: A | Discharge status: Home / Self Care | Payer: Self-pay | Attending: Emergency Medicine | Admitting: Emergency Medicine

## 2018-10-05 ENCOUNTER — Encounter: Payer: Self-pay | Admitting: Emergency Medicine

## 2018-10-05 DIAGNOSIS — F17210 Nicotine dependence, cigarettes, uncomplicated: Secondary | ICD-10-CM | POA: Insufficient documentation

## 2018-10-05 DIAGNOSIS — R05 Cough: Secondary | ICD-10-CM | POA: Insufficient documentation

## 2018-10-05 DIAGNOSIS — R509 Fever, unspecified: Secondary | ICD-10-CM | POA: Insufficient documentation

## 2018-10-05 DIAGNOSIS — J101 Influenza due to other identified influenza virus with other respiratory manifestations: Secondary | ICD-10-CM | POA: Insufficient documentation

## 2018-10-05 DIAGNOSIS — Z79899 Other long term (current) drug therapy: Secondary | ICD-10-CM | POA: Insufficient documentation

## 2018-10-05 LAB — INFLUENZA PANEL BY PCR (TYPE A & B)
Influenza A By PCR: NEGATIVE
Influenza B By PCR: POSITIVE — AB

## 2018-10-05 MED ORDER — OSELTAMIVIR PHOSPHATE 75 MG PO CAPS: 75.0000 mg | ORAL_CAPSULE | Freq: Two times a day (BID) | ORAL | 0 refills | Status: AC

## 2018-10-05 NOTE — ED Triage Notes (Signed)
Patient to ER for c/o body aches, chills, and headache. States she woke up Sunday with stuffy nose. States yesterday she noticed having body aches and chills, then developed headache with bilateral ear aches. Patient unsure of whether she has had fever at home or not.

## 2018-10-05 NOTE — ED Provider Notes (Signed)
[facility] Emergency Department Provider Note   First MD Initiated Contact with Patient 10/05/18 2022     (approximate)  I have reviewed the triage vital signs and the nursing notes.   HISTORY  Chief Complaint Generalized Body Aches    HPI [facility] is a 33 y.o. female presents to the emergency department with acute onset of nasal congestion cough fever which started 2 days ago   Past Medical History:  Diagnosis Date  . Anemia   . Chronic headaches   . Gastric ulcer     Patient Active Problem List   Diagnosis Date Noted  . Anemia 01/29/2016    Past Surgical History:  Procedure Laterality Date  . c-section    . CHOLECYSTECTOMY    . ESOPHAGOGASTRODUODENOSCOPY N/A 02/01/2016   Procedure: ESOPHAGOGASTRODUODENOSCOPY (EGD);  Surgeon: [name], MD;  Location: [facility] ENDOSCOPY;  Service: Endoscopy;  Laterality: N/A;  . ESOPHAGOGASTRODUODENOSCOPY (EGD) WITH PROPOFOL N/A 04/25/2016   Procedure: ESOPHAGOGASTRODUODENOSCOPY (EGD) WITH PROPOFOL;  Surgeon: [name], MD;  Location: [facility] ENDOSCOPY;  Service: Endoscopy;  Laterality: N/A;  . TONSILLECTOMY    . TUBAL LIGATION      Prior to Admission medications   Medication Sig Start Date End Date Taking? Authorizing Provider  amitriptyline (ELAVIL) 25 MG tablet Take 1 tablet (25 mg total) by mouth at bedtime. 02/02/16   [name], MD  cyclobenzaprine (FLEXERIL) 5 MG tablet Take 1 tablet (5 mg total) by mouth 3 (three) times daily as needed for muscle spasms. 03/05/18   Menshew, Dannielle Karvonen, PA-C  doxycycline (VIBRA-TABS) 100 MG tablet Take 1 tablet (100 mg total) by mouth 2 (two) times daily. 06/05/18   Cuthriell, Charline Bills, PA-C  ferrous sulfate 325 (65 FE) MG tablet Take 1 tablet (325 mg total) by mouth 3 (three) times daily with meals. 02/02/16   [name], MD  HYDROcodone-acetaminophen (NORCO/VICODIN) 5-325 MG tablet Take 1 tablet by mouth every 4 (four) hours as needed for moderate pain.  06/05/18   Cuthriell, Charline Bills, PA-C  magic mouthwash w/lidocaine SOLN Take 5 mLs by mouth 4 (four) times daily. 06/05/18   Cuthriell, Charline Bills, PA-C  pantoprazole (PROTONIX) 40 MG tablet Take 1 tablet (40 mg total) by mouth 2 (two) times daily before a meal. 02/02/16   [name], MD    Allergies Keflex [cephalexin]  Family History  Family history unknown: Yes    Social History Social History   Tobacco Use  . Smoking status: Current Every Day Smoker    Packs/day: 1.00  . Smokeless tobacco: Never Used  Substance Use Topics  . Alcohol use: No    Alcohol/week: 0.0 standard drinks  . Drug use: No    Review of Systems Constitutional: Positive for fever/chills Eyes: No visual changes. ENT: No sore throat. Cardiovascular: Denies chest pain. Respiratory: Denies shortness of breath. Gastrointestinal: No abdominal pain.  No nausea, no vomiting.  No diarrhea.  No constipation. Genitourinary: Negative for dysuria. Musculoskeletal: Negative for neck pain.  Negative for back pain. Integumentary: Negative for rash. Neurological: Negative for headaches, focal weakness or numbness.   ____________________________________________   PHYSICAL EXAM:  VITAL SIGNS: ED Triage Vitals  Enc Vitals Group     BP 10/05/18 1922 (!) 142/75     Pulse Rate 10/05/18 1922 87     Resp 10/05/18 1922 20     Temp 10/05/18 1922 98.6 F (37 C)     Temp Source 10/05/18 1922 Oral  SpO2 10/05/18 1922 100 %     Weight 10/05/18 1923 81.6 kg (180 lb)     Height 10/05/18 1923 1.613 m (5' 3.5")     Head Circumference --      Peak Flow --      Pain Score 10/05/18 1923 4     Pain Loc --      Pain Edu? --      Excl. in [place]? --     Constitutional: Alert and oriented. Well appearing and in no acute distress. Eyes: Conjunctivae are normal. PERRL. EOMI. Mouth/Throat: Mucous membranes are moist.  Oropharynx non-erythematous. Neck: No stridor.   Cardiovascular: Normal rate, regular rhythm. Good peripheral  circulation. Grossly normal heart sounds. Respiratory: Normal respiratory effort.  No retractions. Lungs CTAB. Gastrointestinal: Soft and nontender. No distention.  Musculoskeletal: No lower extremity tenderness nor edema. No gross deformities of extremities. Neurologic:  Normal speech and language. No gross focal neurologic deficits are appreciated.  Skin:  Skin is warm, dry and intact. No rash noted. Psychiatric: Mood and affect are normal. Speech and behavior are normal.  ____________________________________________   LABS (all labs ordered are listed, but only abnormal results are displayed)  Labs Reviewed  INFLUENZA PANEL BY PCR (TYPE A & B) - Abnormal; Notable for the following components:      Result Value   Influenza B By PCR POSITIVE (*)    All other components within normal limits      Procedures   ____________________________________________   INITIAL IMPRESSION / ASSESSMENT AND PLAN / ED COURSE  As part of my medical decision making, I reviewed the following data within the [place]  33-year-old female presented with above-stated history and physical exam consistent with possible influenza which was confirmed on swab.  Patient given Tamiflu advised of warning signs that would warrant immediate return to the emergency department. ____________________________________________  FINAL CLINICAL IMPRESSION(S) / ED DIAGNOSES  Final diagnoses:  Influenza B     MEDICATIONS GIVEN DURING THIS VISIT:  Medications - No data to display   ED Discharge Orders    None       Note:  This document was prepared using Dragon voice recognition software and may include unintentional dictation errors.    Gregor Hams, MD 10/05/18 2036

## 2018-10-05 NOTE — ED Notes (Signed)
Pt in with co body aches x 4 days with chills. Unsure of fever, does have headache and congestion. Marland Kitchen

## 2018-10-05 NOTE — ED Notes (Signed)
Patient discharged to home per MD order. Patient in stable condition, and deemed medically cleared by ED provider for discharge. Discharge instructions reviewed with patient/family using "Teach Back"; verbalized understanding of medication education and administration, and information about follow-up care. Denies further concerns. ° °

## 2018-11-16 ENCOUNTER — Other Ambulatory Visit: Payer: Self-pay

## 2018-11-16 ENCOUNTER — Emergency Department
Admission: EM | Admit: 2018-11-16 | Discharge: 2018-11-16 | Disposition: A | Discharge status: Home / Self Care | Payer: Self-pay | Attending: Emergency Medicine | Admitting: Emergency Medicine

## 2018-11-16 DIAGNOSIS — Z79899 Other long term (current) drug therapy: Secondary | ICD-10-CM | POA: Insufficient documentation

## 2018-11-16 DIAGNOSIS — Z9049 Acquired absence of other specified parts of digestive tract: Secondary | ICD-10-CM | POA: Insufficient documentation

## 2018-11-16 DIAGNOSIS — F17200 Nicotine dependence, unspecified, uncomplicated: Secondary | ICD-10-CM | POA: Insufficient documentation

## 2018-11-16 DIAGNOSIS — N920 Excessive and frequent menstruation with regular cycle: Secondary | ICD-10-CM | POA: Insufficient documentation

## 2018-11-16 LAB — BASIC METABOLIC PANEL
Anion gap: 8 (ref 5–15)
BUN: 8 mg/dL (ref 6–20)
CO2: 23 mmol/L (ref 22–32)
Calcium: 8.8 mg/dL — ABNORMAL LOW (ref 8.9–10.3)
Chloride: 107 mmol/L (ref 98–111)
Creatinine, Ser: 0.66 mg/dL (ref 0.44–1.00)
GFR calc Af Amer: >60 mL/min (ref >60)
GFR calc non Af Amer: >60 mL/min (ref >60)
GLUCOSE: 121 mg/dL — AB (ref 70–99)
Potassium: 3.8 mmol/L (ref 3.5–5.1)
Sodium: 138 mmol/L (ref 135–145)

## 2018-11-16 LAB — CBC
HCT: 36 % (ref 36.0–46.0)
HEMOGLOBIN: 10.6 g/dL — AB (ref 12.0–15.0)
MCH: 22.1 pg — ABNORMAL LOW (ref 26.0–34.0)
MCHC: 29.4 g/dL — ABNORMAL LOW (ref 30.0–36.0)
MCV: 75 fL — ABNORMAL LOW (ref 80.0–100.0)
Platelets: 244 10*3/uL (ref 150–400)
RBC: 4.8 MIL/uL (ref 3.87–5.11)
RDW: 17.8 % — ABNORMAL HIGH (ref 11.5–15.5)
WBC: 6.2 10*3/uL (ref 4.0–10.5)
nRBC: 0 % (ref 0.0–0.2)

## 2018-11-16 NOTE — ED Triage Notes (Signed)
Pt states she began period yesterday. States heavier than normal and caused her to be dizzy earlier today. Tubal ligation, no birth control.   A&O, ambulatory. No distress noted.

## 2018-11-16 NOTE — ED Provider Notes (Signed)
[facility] Emergency Department Provider Note   ____________________________________________    I have reviewed the triage vital signs and the nursing notes.   HISTORY  Chief Complaint Vaginal Bleeding     HPI [facility] is a 33 y.o. female presents with complaints of heavy vaginal bleeding.  Patient reports this is the first day of her usual menstrual cycle and she felt that her bleeding was significantly more heavy than typical this morning, now it is improved significantly.  However she did have lightheadedness and felt dizzy earlier today.  She does report a history of needing blood transfusions in the past but this was apparently related to a bleeding ulcer.  No dizziness currently.  She does have some abdominal cramping which she describes as typical of her menstrual cycle  Past Medical History:  Diagnosis Date  . Anemia   . Chronic headaches   . Gastric ulcer     Patient Active Problem List   Diagnosis Date Noted  . Anemia 01/29/2016    Past Surgical History:  Procedure Laterality Date  . c-section    . CHOLECYSTECTOMY    . ESOPHAGOGASTRODUODENOSCOPY N/A 02/01/2016   Procedure: ESOPHAGOGASTRODUODENOSCOPY (EGD);  Surgeon: [name], MD;  Location: [facility] ENDOSCOPY;  Service: Endoscopy;  Laterality: N/A;  . ESOPHAGOGASTRODUODENOSCOPY (EGD) WITH PROPOFOL N/A 04/25/2016   Procedure: ESOPHAGOGASTRODUODENOSCOPY (EGD) WITH PROPOFOL;  Surgeon: [name], MD;  Location: [facility] ENDOSCOPY;  Service: Endoscopy;  Laterality: N/A;  . TONSILLECTOMY    . TUBAL LIGATION      Prior to Admission medications   Medication Sig Start Date End Date Taking? Authorizing Provider  amitriptyline (ELAVIL) 25 MG tablet Take 1 tablet (25 mg total) by mouth at bedtime. 02/02/16   [name], MD  cyclobenzaprine (FLEXERIL) 5 MG tablet Take 1 tablet (5 mg total) by mouth 3 (three) times daily as needed for muscle spasms. 03/05/18   Menshew, Dannielle Karvonen,  PA-C  doxycycline (VIBRA-TABS) 100 MG tablet Take 1 tablet (100 mg total) by mouth 2 (two) times daily. 06/05/18   Cuthriell, Charline Bills, PA-C  ferrous sulfate 325 (65 FE) MG tablet Take 1 tablet (325 mg total) by mouth 3 (three) times daily with meals. 02/02/16   [name], MD  HYDROcodone-acetaminophen (NORCO/VICODIN) 5-325 MG tablet Take 1 tablet by mouth every 4 (four) hours as needed for moderate pain. 06/05/18   Cuthriell, Charline Bills, PA-C  magic mouthwash w/lidocaine SOLN Take 5 mLs by mouth 4 (four) times daily. 06/05/18   Cuthriell, Charline Bills, PA-C  pantoprazole (PROTONIX) 40 MG tablet Take 1 tablet (40 mg total) by mouth 2 (two) times daily before a meal. 02/02/16   [name], MD     Allergies Keflex [cephalexin]  Family History  Family history unknown: Yes    Social History Social History   Tobacco Use  . Smoking status: Current Every Day Smoker    Packs/day: 1.00  . Smokeless tobacco: Never Used  Substance Use Topics  . Alcohol use: No    Alcohol/week: 0.0 standard drinks  . Drug use: No    Review of Systems  Constitutional: As above Eyes: No visual changes.  ENT: No sore throat. Cardiovascular: Denies chest pain. Respiratory: Denies shortness of breath. Gastrointestinal: No abdominal pain.  No nausea, no vomiting.   Genitourinary: As above Musculoskeletal: Negative for back pain. Skin: Negative for rash. Neurological: Negative for headaches    ____________________________________________   PHYSICAL EXAM:  VITAL SIGNS: ED Triage Vitals  Enc Vitals Group     BP 11/16/18 1707 129/61     Pulse Rate 11/16/18 1707 80     Resp 11/16/18 1707 18     Temp 11/16/18 1707 98.7 F (37.1 C)     Temp Source 11/16/18 1707 Oral     SpO2 11/16/18 1707 100 %     Weight 11/16/18 1708 81.6 kg (180 lb)     Height 11/16/18 1708 1.6 m (5\' 3" )     Head Circumference --      Peak Flow --      Pain Score 11/16/18 1707 4     Pain Loc --      Pain Edu? --      Excl. in [place]? --      Constitutional: Alert and oriented.  Eyes: Conjunctivae are normal.  . Nose: No congestion/rhinnorhea. Mouth/Throat: Mucous membranes are moist.    Cardiovascular: Normal rate, regular rhythm.  Good peripheral circulation. Respiratory: Normal respiratory effort.  No retractions. Gastrointestinal: Soft and nontender. No distention.    Musculoskeletal: No lower extremity tenderness nor edema.  Warm and well perfused Neurologic:  Normal speech and language. No gross focal neurologic deficits are appreciated.  Skin:  Skin is warm, dry and intact. No rash noted. Psychiatric: Mood and affect are normal. Speech and behavior are normal.  ____________________________________________   LABS (all labs ordered are listed, but only abnormal results are displayed)  Labs Reviewed  CBC - Abnormal; Notable for the following components:      Result Value   Hemoglobin 10.6 (*)    MCV 75.0 (*)    MCH 22.1 (*)    MCHC 29.4 (*)    RDW 17.8 (*)    All other components within normal limits  BASIC METABOLIC PANEL - Abnormal; Notable for the following components:   Glucose, Bld 121 (*)    Calcium 8.8 (*)    All other components within normal limits  URINALYSIS, COMPLETE (UACMP) WITH MICROSCOPIC  POC URINE PREG, ED   ____________________________________________  EKG  None ____________________________________________  RADIOLOGY  None ____________________________________________   PROCEDURES  Procedure(s) performed: No  Procedures   Critical Care performed: No ____________________________________________   INITIAL IMPRESSION / ASSESSMENT AND PLAN / ED COURSE  Pertinent labs & imaging results that were available during my care of the patient were reviewed by me and considered in my medical decision making (see chart for details).  Patient well-appearing in no acute distress, she is reassured to hear that her hemoglobin is 10.6 discussed likely need for iron supplementation  outpatient follow-up.    ____________________________________________   FINAL CLINICAL IMPRESSION(S) / ED DIAGNOSES  Final diagnoses:  Menorrhagia with regular cycle        Note:  This document was prepared using Dragon voice recognition software and may include unintentional dictation errors.   Lavonia Drafts, MD 11/16/18 1843

## 2018-12-10 ENCOUNTER — Encounter: Payer: Self-pay | Admitting: Emergency Medicine

## 2018-12-10 ENCOUNTER — Emergency Department: Payer: Self-pay

## 2018-12-10 ENCOUNTER — Emergency Department
Admission: EM | Admit: 2018-12-10 | Discharge: 2018-12-10 | Discharge status: Against Medical Advice | Payer: Self-pay | Attending: Emergency Medicine | Admitting: Emergency Medicine

## 2018-12-10 ENCOUNTER — Other Ambulatory Visit: Payer: Self-pay

## 2018-12-10 ENCOUNTER — Telehealth: Payer: Self-pay | Admitting: Emergency Medicine

## 2018-12-10 DIAGNOSIS — R079 Chest pain, unspecified: Secondary | ICD-10-CM | POA: Insufficient documentation

## 2018-12-10 DIAGNOSIS — Z5321 Procedure and treatment not carried out due to patient leaving prior to being seen by health care provider: Secondary | ICD-10-CM | POA: Insufficient documentation

## 2018-12-10 LAB — CBC WITH DIFFERENTIAL/PLATELET
Abs Immature Granulocytes: 0.01 10*3/uL (ref 0.00–0.07)
Basophils Absolute: 0.1 10*3/uL (ref 0.0–0.1)
Basophils Relative: 1 %
Eosinophils Absolute: 0.2 10*3/uL (ref 0.0–0.5)
Eosinophils Relative: 3 %
HCT: 34.9 % — ABNORMAL LOW (ref 36.0–46.0)
Hemoglobin: 10.3 g/dL — ABNORMAL LOW (ref 12.0–15.0)
Immature Granulocytes: 0 %
LYMPHS ABS: 2.7 10*3/uL (ref 0.7–4.0)
LYMPHS PCT: 32 %
MCH: 21.6 pg — AB (ref 26.0–34.0)
MCHC: 29.5 g/dL — AB (ref 30.0–36.0)
MCV: 73.3 fL — ABNORMAL LOW (ref 80.0–100.0)
Monocytes Absolute: 0.6 10*3/uL (ref 0.1–1.0)
Monocytes Relative: 7 %
Neutro Abs: 4.7 10*3/uL (ref 1.7–7.7)
Neutrophils Relative %: 57 %
Platelets: 244 10*3/uL (ref 150–400)
RBC: 4.76 MIL/uL (ref 3.87–5.11)
RDW: 17.5 % — ABNORMAL HIGH (ref 11.5–15.5)
WBC: 8.2 10*3/uL (ref 4.0–10.5)
nRBC: 0 % (ref 0.0–0.2)

## 2018-12-10 LAB — COMPREHENSIVE METABOLIC PANEL
ALT: 17 U/L (ref 0–44)
ANION GAP: 8 (ref 5–15)
AST: 20 U/L (ref 15–41)
Albumin: 4.3 g/dL (ref 3.5–5.0)
Alkaline Phosphatase: 85 U/L (ref 38–126)
BUN: 9 mg/dL (ref 6–20)
CALCIUM: 9.1 mg/dL (ref 8.9–10.3)
CO2: 22 mmol/L (ref 22–32)
Chloride: 107 mmol/L (ref 98–111)
Creatinine, Ser: 0.64 mg/dL (ref 0.44–1.00)
GFR calc Af Amer: >60 mL/min (ref >60)
GFR calc non Af Amer: >60 mL/min (ref >60)
Glucose, Bld: 115 mg/dL — ABNORMAL HIGH (ref 70–99)
Potassium: 3.8 mmol/L (ref 3.5–5.1)
Sodium: 137 mmol/L (ref 135–145)
Total Bilirubin: 0.4 mg/dL (ref 0.3–1.2)
Total Protein: 7.3 g/dL (ref 6.5–8.1)

## 2018-12-10 LAB — TROPONIN I

## 2018-12-10 NOTE — ED Notes (Signed)
No answer when called several times from lobby 

## 2018-12-10 NOTE — ED Triage Notes (Signed)
Patient ambulatory to triage with steady gait, without difficulty or distress noted; pt reports rt sided CP, nonradiating tonight accomp by [facility]; denies hx of same but has had recent prod cough yelllow sputum

## 2018-12-10 NOTE — Telephone Encounter (Signed)
Called patient due to lwot to inquire about condition and follow up plans.  No answer and no voicemail  

## 2019-01-26 ENCOUNTER — Emergency Department
Admission: EM | Admit: 2019-01-26 | Discharge: 2019-01-27 | Disposition: A | Discharge status: Home / Self Care | Payer: HRSA Program | Attending: Emergency Medicine | Admitting: Emergency Medicine

## 2019-01-26 ENCOUNTER — Encounter: Payer: Self-pay | Admitting: Emergency Medicine

## 2019-01-26 ENCOUNTER — Other Ambulatory Visit: Payer: Self-pay

## 2019-01-26 DIAGNOSIS — Z20828 Contact with and (suspected) exposure to other viral communicable diseases: Secondary | ICD-10-CM | POA: Diagnosis not present

## 2019-01-26 DIAGNOSIS — R05 Cough: Secondary | ICD-10-CM | POA: Diagnosis present

## 2019-01-26 DIAGNOSIS — F17210 Nicotine dependence, cigarettes, uncomplicated: Secondary | ICD-10-CM | POA: Diagnosis not present

## 2019-01-26 DIAGNOSIS — J209 Acute bronchitis, unspecified: Secondary | ICD-10-CM | POA: Insufficient documentation

## 2019-01-26 DIAGNOSIS — Z79899 Other long term (current) drug therapy: Secondary | ICD-10-CM | POA: Insufficient documentation

## 2019-01-26 NOTE — ED Triage Notes (Signed)
Patient ambulatory to triage with steady gait, without difficulty or distress noted, mask in place; pt reports cough x 2 days, now with sore throat

## 2019-01-27 ENCOUNTER — Emergency Department: Payer: HRSA Program

## 2019-01-27 LAB — SARS CORONAVIRUS 2 BY RT PCR (HOSPITAL ORDER, PERFORMED IN ~~LOC~~ HOSPITAL LAB): SARS Coronavirus 2: NEGATIVE

## 2019-01-27 LAB — GROUP A STREP BY PCR: Group A Strep by PCR: NOT DETECTED

## 2019-01-27 MED ORDER — BENZONATATE 100 MG PO CAPS: 100.0000 mg | ORAL_CAPSULE | Freq: Three times a day (TID) | ORAL | 0 refills | Status: DC | PRN

## 2019-01-27 MED ORDER — AZITHROMYCIN 500 MG PO TABS: 500.0000 mg | ORAL_TABLET | Freq: Every day | ORAL | 0 refills | Status: AC

## 2019-01-27 MED ORDER — AZITHROMYCIN 500 MG PO TABS
500.0000 mg | ORAL_TABLET | Freq: Once | ORAL | Status: AC
  Administered 2019-01-27: 500 mg via ORAL
  Filled 2019-01-27: qty 1

## 2019-01-27 NOTE — ED Notes (Signed)
Paper copy d/c signature

## 2019-01-27 NOTE — ED Provider Notes (Signed)
[facility] Emergency Department Provider Note    First MD Initiated Contact with Patient 01/27/19 0015     (approximate)  I have reviewed the triage vital signs and the nursing notes.   HISTORY  Chief Complaint Cough   HPI Faith Ellis is a 33 y.o. female presents to the emergency department with a 2-day history of cough sore throat.  Patient denies any fever afebrile on presentation.  Patient states that a sick contact had strep throat.  Patient does admit to 1 pack/day cigarette use.        Past Medical History:  Diagnosis Date  . Anemia   . Chronic headaches   . Gastric ulcer     Patient Active Problem List   Diagnosis Date Noted  . Anemia 01/29/2016    Past Surgical History:  Procedure Laterality Date  . c-section    . CHOLECYSTECTOMY    . ESOPHAGOGASTRODUODENOSCOPY N/A 02/01/2016   Procedure: ESOPHAGOGASTRODUODENOSCOPY (EGD);  Surgeon: [name], MD;  Location: [facility] ENDOSCOPY;  Service: Endoscopy;  Laterality: N/A;  . ESOPHAGOGASTRODUODENOSCOPY (EGD) WITH PROPOFOL N/A 04/25/2016   Procedure: ESOPHAGOGASTRODUODENOSCOPY (EGD) WITH PROPOFOL;  Surgeon: [name], MD;  Location: [facility] ENDOSCOPY;  Service: Endoscopy;  Laterality: N/A;  . TONSILLECTOMY    . TUBAL LIGATION      Prior to Admission medications   Medication Sig Start Date End Date Taking? Authorizing Provider  amitriptyline (ELAVIL) 25 MG tablet Take 1 tablet (25 mg total) by mouth at bedtime. 02/02/16   [name], MD  azithromycin (ZITHROMAX) 500 MG tablet Take 1 tablet (500 mg total) by mouth daily for 3 days. Take 1 tablet daily for 3 days. 01/27/19 01/30/19  Gregor Hams, MD  benzonatate (TESSALON PERLES) 100 MG capsule Take 1 capsule (100 mg total) by mouth 3 (three) times daily as needed. 01/27/19 01/27/20  Gregor Hams, MD  cyclobenzaprine (FLEXERIL) 5 MG tablet Take 1 tablet (5 mg total) by mouth 3 (three) times daily as needed for muscle spasms.  03/05/18   Menshew, Dannielle Karvonen, PA-C  doxycycline (VIBRA-TABS) 100 MG tablet Take 1 tablet (100 mg total) by mouth 2 (two) times daily. 06/05/18   Cuthriell, Charline Bills, PA-C  ferrous sulfate 325 (65 FE) MG tablet Take 1 tablet (325 mg total) by mouth 3 (three) times daily with meals. 02/02/16   [name], MD  HYDROcodone-acetaminophen (NORCO/VICODIN) 5-325 MG tablet Take 1 tablet by mouth every 4 (four) hours as needed for moderate pain. 06/05/18   Cuthriell, Charline Bills, PA-C  magic mouthwash w/lidocaine SOLN Take 5 mLs by mouth 4 (four) times daily. 06/05/18   Cuthriell, Charline Bills, PA-C  pantoprazole (PROTONIX) 40 MG tablet Take 1 tablet (40 mg total) by mouth 2 (two) times daily before a meal. 02/02/16   [name], MD    Allergies Keflex [cephalexin]  Family History  Family history unknown: Yes    Social History Social History   Tobacco Use  . Smoking status: Current Every Day Smoker    Packs/day: 1.00  . Smokeless tobacco: Never Used  Substance Use Topics  . Alcohol use: No    Alcohol/week: 0.0 standard drinks  . Drug use: No    Review of Systems Constitutional: No fever/chills Eyes: No visual changes. ENT: Positive for sore throat. Cardiovascular: Denies chest pain. Respiratory: Denies shortness of breath.  Positive for cough Gastrointestinal: No abdominal pain.  No nausea, no vomiting.  No diarrhea.  No constipation. Genitourinary: Negative for  dysuria. Musculoskeletal: Negative for neck pain.  Negative for back pain. Integumentary: Negative for rash. Neurological: Negative for headaches, focal weakness or numbness.  ____________________________________________   PHYSICAL EXAM:  VITAL SIGNS: ED Triage Vitals  Enc Vitals Group     BP 01/26/19 2324 128/70     Pulse Rate 01/26/19 2324 91     Resp 01/26/19 2324 16     Temp 01/26/19 2324 98.4 F (36.9 C)     Temp Source 01/26/19 2324 Oral     SpO2 01/26/19 2324 100 %     Weight 01/26/19 2328 81.6 kg (180 lb)      Height 01/26/19 2328 1.6 m (5\' 3" )     Head Circumference --      Peak Flow --      Pain Score 01/26/19 2328 4     Pain Loc --      Pain Edu? --      Excl. in [place]? --     Constitutional: Alert and oriented. Well appearing and in no acute distress. Eyes: Conjunctivae are normal. PERRL. EOMI. Head: Atraumatic. Ears:  Healthy appearing ear canals and TMs bilaterally Nose: No congestion/rhinnorhea. Mouth/Throat: Mucous membranes are moist. Oropharynx non-erythematous. Neck: No stridor.  No meningeal signs.  Palpable cervical lymphadenopathy. Cardiovascular: Normal rate, regular rhythm. Good peripheral circulation. Grossly normal heart sounds. Respiratory: Normal respiratory effort.  No retractions. No audible wheezing. Gastrointestinal: Soft and nontender. No distention.  Musculoskeletal: No lower extremity tenderness nor edema. No gross deformities of extremities. Neurologic:  Normal speech and language. No gross focal neurologic deficits are appreciated.  Skin:  Skin is warm, dry and intact. No rash noted. Psychiatric: Mood and affect are normal. Speech and behavior are normal.  ____________________________________________   LABS (all labs ordered are listed, but only abnormal results are displayed)  Labs Reviewed  SARS CORONAVIRUS 2 (HOSPITAL ORDER, [place] [facility])  GROUP A STREP BY PCR    RADIOLOGY I, Dixon N Saahil Herbster, personally viewed and evaluated these images (plain radiographs) as part of my medical decision making, as well as reviewing the written report by the radiologist.  ED MD interpretation: Negative chest x-ray per radiologist.  Official radiology report(s): Dg Chest Portable 1 View  Result Date: 01/27/2019 CLINICAL DATA:  33 year old female with cough. EXAM: PORTABLE CHEST 1 VIEW COMPARISON:  12/10/2018 and earlier. FINDINGS: Portable AP upright view at 0003 hours. Chronic mild elevation of the right hemidiaphragm appears stable and is  likely a normal variant. Normal cardiac size and mediastinal contours. Visualized tracheal air column is within normal limits. Allowing for portable technique the lungs are clear. No pneumothorax or pleural effusion. Negative visible bowel gas pattern and osseous structures. IMPRESSION: Negative portable chest. Electronically Signed   By: Genevie Ann M.D.   On: 01/27/2019 00:35    _______________ Procedures   ____________________________________________   INITIAL IMPRESSION / MDM / [place] / ED COURSE  As part of my medical decision making, I reviewed the following data within the electronic MEDICAL RECORD NUMBER   33 year old female presented with above-stated history and physical exam.  Concern for possible coronavirus bronchitis pneumonia strep throat.  Rapid strep negative chest x-ray revealed no evidence of pneumonia COVID-19 testing negative.       ____________________________________________  FINAL CLINICAL IMPRESSION(S) / ED DIAGNOSES  Final diagnoses:  Acute bronchitis, unspecified organism     MEDICATIONS GIVEN DURING THIS VISIT:  Medications  azithromycin (ZITHROMAX) tablet 500 mg (500 mg Oral Given 01/27/19 0146)  ED Discharge Orders         Ordered    azithromycin (ZITHROMAX) 500 MG tablet  Daily     01/27/19 0135    benzonatate (TESSALON PERLES) 100 MG capsule  3 times daily PRN     01/27/19 0135           Note:  This document was prepared using Dragon voice recognition software and may include unintentional dictation errors.   Gregor Hams, MD 01/27/19 [phone]

## 2019-06-02 ENCOUNTER — Other Ambulatory Visit: Payer: Self-pay

## 2019-06-02 ENCOUNTER — Emergency Department
Admission: EM | Admit: 2019-06-02 | Discharge: 2019-06-02 | Disposition: A | Discharge status: Home / Self Care | Payer: Self-pay | Attending: Student in an Organized Health Care Education/Training Program | Admitting: Student in an Organized Health Care Education/Training Program

## 2019-06-02 ENCOUNTER — Encounter: Payer: Self-pay | Admitting: Emergency Medicine

## 2019-06-02 DIAGNOSIS — J069 Acute upper respiratory infection, unspecified: Secondary | ICD-10-CM | POA: Insufficient documentation

## 2019-06-02 DIAGNOSIS — F17210 Nicotine dependence, cigarettes, uncomplicated: Secondary | ICD-10-CM | POA: Insufficient documentation

## 2019-06-02 DIAGNOSIS — Z20828 Contact with and (suspected) exposure to other viral communicable diseases: Secondary | ICD-10-CM | POA: Insufficient documentation

## 2019-06-02 DIAGNOSIS — Z20822 Contact with and (suspected) exposure to covid-19: Secondary | ICD-10-CM

## 2019-06-02 DIAGNOSIS — Z79899 Other long term (current) drug therapy: Secondary | ICD-10-CM | POA: Insufficient documentation

## 2019-06-02 MED ORDER — PREDNISONE 10 MG PO TABS: 50.0000 mg | ORAL_TABLET | Freq: Every day | ORAL | 0 refills | Status: DC

## 2019-06-02 MED ORDER — ALBUTEROL SULFATE HFA 108 (90 BASE) MCG/ACT IN AERS: 2.0000 | INHALATION_SPRAY | RESPIRATORY_TRACT | 1 refills | Status: DC | PRN

## 2019-06-02 NOTE — ED Triage Notes (Signed)
Says head cold, cough.

## 2019-06-02 NOTE — ED Provider Notes (Signed)
[facility] Emergency Department Provider Note  ____________________________________________  Time seen: Approximately 3:58 PM  I have reviewed the triage vital signs and the nursing notes.   HISTORY  Chief Complaint Cough and Otalgia   HPI Faith Ellis is a 33 y.o. female who presents to the emergency department for treatment and evaluation of URI symptoms and concern for COVID-19.  Patient states that symptoms started about 4 days ago and have progressively worsened.  She states that she initially started with sore throat, dry cough, and nasal congestion.  Symptoms have progressed to fatigue and generalized weakness.  She denies fever.  No relief with ibuprofen.    Past Medical History:  Diagnosis Date  . Anemia   . Chronic headaches   . Gastric ulcer     Patient Active Problem List   Diagnosis Date Noted  . Anemia 01/29/2016    Past Surgical History:  Procedure Laterality Date  . c-section    . CHOLECYSTECTOMY    . ESOPHAGOGASTRODUODENOSCOPY N/A 02/01/2016   Procedure: ESOPHAGOGASTRODUODENOSCOPY (EGD);  Surgeon: [name], MD;  Location: [facility] ENDOSCOPY;  Service: Endoscopy;  Laterality: N/A;  . ESOPHAGOGASTRODUODENOSCOPY (EGD) WITH PROPOFOL N/A 04/25/2016   Procedure: ESOPHAGOGASTRODUODENOSCOPY (EGD) WITH PROPOFOL;  Surgeon: [name], MD;  Location: [facility] ENDOSCOPY;  Service: Endoscopy;  Laterality: N/A;  . TONSILLECTOMY    . TUBAL LIGATION      Prior to Admission medications   Medication Sig Start Date End Date Taking? Authorizing Provider  albuterol (VENTOLIN HFA) 108 (90 Base) MCG/ACT inhaler Inhale 2 puffs into the lungs every 4 (four) hours as needed for wheezing or shortness of breath. 06/02/19   Teryl Gubler, Johnette Abraham B, FNP  amitriptyline (ELAVIL) 25 MG tablet Take 1 tablet (25 mg total) by mouth at bedtime. 02/02/16   [name], MD  ferrous sulfate 325 (65 FE) MG tablet Take 1 tablet (325 mg total) by mouth 3 (three) times daily  with meals. 02/02/16   [name], MD  pantoprazole (PROTONIX) 40 MG tablet Take 1 tablet (40 mg total) by mouth 2 (two) times daily before a meal. 02/02/16   [name], MD  predniSONE (DELTASONE) 10 MG tablet Take 5 tablets (50 mg total) by mouth daily. 06/02/19   Victorino Dike, FNP    Allergies Keflex [cephalexin]  Family History  Family history unknown: Yes    Social History Social History   Tobacco Use  . Smoking status: Current Every Day Smoker    Packs/day: 1.00  . Smokeless tobacco: Never Used  Substance Use Topics  . Alcohol use: No    Alcohol/week: 0.0 standard drinks  . Drug use: No    Review of Systems Constitutional: Negative for fever/chills.  Normal appetite. ENT: Positive for sore throat. Cardiovascular: Denies chest pain. Respiratory: Negative for shortness of breath. Positive for cough. Positive for wheezing.  Gastrointestinal: Negative for nausea,  no vomiting.  Negative for diarrhea.  Musculoskeletal: Negative for body aches Skin: Negative for rash. Neurological: Negative for headaches ____________________________________________   PHYSICAL EXAM:  VITAL SIGNS: ED Triage Vitals  Enc Vitals Group     BP 06/02/19 1358 121/71     Pulse Rate 06/02/19 1358 78     Resp 06/02/19 1358 19     Temp 06/02/19 1358 98.4 F (36.9 C)     Temp Source 06/02/19 1358 Oral     SpO2 06/02/19 1358 96 %     Weight 06/02/19 1359 180 lb (81.6 kg)  Height 06/02/19 1359 5\' 3"  (1.6 m)     Head Circumference --      Peak Flow --      Pain Score 06/02/19 1548 0     Pain Loc --      Pain Edu? --      Excl. in [place]? --     Constitutional: Alert and oriented. Well appearing and in no acute distress. Eyes: Conjunctivae are normal. Ears: Bilateral TM normal. Nose: Negative for sinus congestion noted; clear rhinnorhea. Mouth/Throat: Mucous membranes are moist.  Oropharynx normal. Tonsils not visualized. Uvula midline. Neck: No stridor.  Lymphatic: No cervical  lymphadenopathy. Cardiovascular: Normal rate, regular rhythm. Good peripheral circulation. Respiratory: Respirations are even and unlabored.  No retractions. Scattered wheeze on exam. Gastrointestinal: Soft and nontender.  Musculoskeletal: FROM x 4 extremities.  Neurologic:  Normal speech and language. Skin:  Skin is warm, dry and intact. No rash noted. Psychiatric: Mood and affect are normal. Speech and behavior are normal.  ____________________________________________   LABS (all labs ordered are listed, but only abnormal results are displayed)  Labs Reviewed  SARS CORONAVIRUS 2 (TAT 6-24 HRS)   ____________________________________________  EKG  Not indicated. ____________________________________________  RADIOLOGY  Not indicated. ____________________________________________   PROCEDURES  Procedure(s) performed: None  Critical Care performed: No ____________________________________________   INITIAL IMPRESSION / ASSESSMENT AND PLAN / ED COURSE  33 y.o. female who presents to the emergency department for COVID-19 testing.  She has had exposure.  On exam, she does have some scattered wheezing.  She does smoke cigarettes.  She will be treated with prednisone and albuterol.  She was advised to use Mucinex for the nasal congestion.  COVID-19 testing will be completed today.  She was advised to stay home until results are back.  Work note will be provided.  She is also aware that she will need to quarantine for 14 days if the test is positive.  Faith Ellis was evaluated in Emergency Department on 06/02/2019 for the symptoms described in the history of present illness. She was evaluated in the context of the global COVID-19 pandemic, which necessitated consideration that the patient might be at risk for infection with the SARS-CoV-2 virus that causes COVID-19. Institutional protocols and algorithms that pertain to the evaluation of patients at risk for COVID-19 are in a state  of rapid change based on information released by regulatory bodies including the CDC and federal and state organizations. These policies and algorithms were followed during the patient's care in the ED.     Medications - No data to display  ED Discharge Orders         Ordered    predniSONE (DELTASONE) 10 MG tablet  Daily     06/02/19 1536    albuterol (VENTOLIN HFA) 108 (90 Base) MCG/ACT inhaler  Every 4 hours PRN     06/02/19 1536           Pertinent labs & imaging results that were available during my care of the patient were reviewed by me and considered in my medical decision making (see chart for details).    If controlled substance prescribed during this visit, 12 month history viewed on the [place] prior to issuing an initial prescription for Schedule II or III opiod. ____________________________________________   FINAL CLINICAL IMPRESSION(S) / ED DIAGNOSES  Final diagnoses:  Viral URI with cough  Exposure to Covid-19 Virus    Note:  This document was prepared using Dragon voice recognition software and may include unintentional  dictation errors.    Victorino Dike, FNP 06/02/19 1759    Merlyn Lot, MD 06/02/19 [phone]

## 2019-06-02 NOTE — ED Notes (Signed)
See triage note  Presents with some fever last Friday  Has had sore throat and dry cough  Afebrile at present  Is not sure if she was exposed to [place]

## 2019-06-02 NOTE — ED Triage Notes (Signed)
Says she does not feel well.  Says tired and her head and ears hurt and cough.

## 2019-06-03 LAB — SARS CORONAVIRUS 2 (TAT 6-24 HRS): SARS Coronavirus 2: NEGATIVE

## 2019-09-10 ENCOUNTER — Emergency Department
Admission: EM | Admit: 2019-09-10 | Discharge: 2019-09-10 | Disposition: A | Discharge status: Home / Self Care | Payer: Self-pay | Attending: Emergency Medicine | Admitting: Emergency Medicine

## 2019-09-10 ENCOUNTER — Other Ambulatory Visit: Payer: Self-pay

## 2019-09-10 ENCOUNTER — Encounter: Payer: Self-pay | Admitting: Emergency Medicine

## 2019-09-10 ENCOUNTER — Emergency Department: Payer: Self-pay

## 2019-09-10 DIAGNOSIS — R5383 Other fatigue: Secondary | ICD-10-CM | POA: Insufficient documentation

## 2019-09-10 DIAGNOSIS — R6883 Chills (without fever): Secondary | ICD-10-CM | POA: Insufficient documentation

## 2019-09-10 DIAGNOSIS — Z79899 Other long term (current) drug therapy: Secondary | ICD-10-CM | POA: Insufficient documentation

## 2019-09-10 DIAGNOSIS — M791 Myalgia, unspecified site: Secondary | ICD-10-CM | POA: Insufficient documentation

## 2019-09-10 DIAGNOSIS — R0602 Shortness of breath: Secondary | ICD-10-CM | POA: Insufficient documentation

## 2019-09-10 DIAGNOSIS — F17200 Nicotine dependence, unspecified, uncomplicated: Secondary | ICD-10-CM | POA: Insufficient documentation

## 2019-09-10 DIAGNOSIS — Z20828 Contact with and (suspected) exposure to other viral communicable diseases: Secondary | ICD-10-CM | POA: Insufficient documentation

## 2019-09-10 LAB — BASIC METABOLIC PANEL
Anion gap: 10 (ref 5–15)
BUN: 12 mg/dL (ref 6–20)
CO2: 21 mmol/L — ABNORMAL LOW (ref 22–32)
Calcium: 9 mg/dL (ref 8.9–10.3)
Chloride: 105 mmol/L (ref 98–111)
Creatinine, Ser: 0.55 mg/dL (ref 0.44–1.00)
GFR calc Af Amer: >60 mL/min (ref >60)
GFR calc non Af Amer: >60 mL/min (ref >60)
Glucose, Bld: 89 mg/dL (ref 70–99)
Potassium: 4 mmol/L (ref 3.5–5.1)
Sodium: 136 mmol/L (ref 135–145)

## 2019-09-10 LAB — CBC
HCT: 37.8 % (ref 36.0–46.0)
Hemoglobin: 11.1 g/dL — ABNORMAL LOW (ref 12.0–15.0)
MCH: 21.2 pg — ABNORMAL LOW (ref 26.0–34.0)
MCHC: 29.4 g/dL — ABNORMAL LOW (ref 30.0–36.0)
MCV: 72.3 fL — ABNORMAL LOW (ref 80.0–100.0)
Platelets: 295 10*3/uL (ref 150–400)
RBC: 5.23 MIL/uL — ABNORMAL HIGH (ref 3.87–5.11)
RDW: 17.8 % — ABNORMAL HIGH (ref 11.5–15.5)
WBC: 6.7 10*3/uL (ref 4.0–10.5)
nRBC: 0 % (ref 0.0–0.2)

## 2019-09-10 LAB — TYPE AND SCREEN
ABO/RH(D): A POS
Antibody Screen: NEGATIVE

## 2019-09-10 MED ORDER — SODIUM CHLORIDE 0.9% FLUSH: 3.0000 mL | Freq: Once | INTRAVENOUS | Status: DC

## 2019-09-10 MED ORDER — ALBUTEROL SULFATE (2.5 MG/3ML) 0.083% IN NEBU
5.0000 mg | INHALATION_SOLUTION | Freq: Once | RESPIRATORY_TRACT | Status: AC
  Administered 2019-09-10: 16:00:00 5 mg via RESPIRATORY_TRACT
  Filled 2019-09-10: qty 6

## 2019-09-10 NOTE — ED Triage Notes (Signed)
Pt via pov from home with sob x 1 week, with fatigue, leg pain x 2 days. Has had similar symptoms before and had to have transfusion 3 years ago. Pt also reports bruising easily as of last 2 months.Denies use of blood thinners. Pt alert & oriented, nad noted.

## 2019-09-10 NOTE — ED Provider Notes (Signed)
[facility] Emergency Department Provider Note  ____________________________________________  Time seen: Approximately 5:09 PM  I have reviewed the triage vital signs and the nursing notes.   HISTORY  Chief Complaint Generalized Body Aches and Shortness of Breath    HPI Faith Ellis is a 33 y.o. female who presents the emergency department for sudden onset of fatigue, chills.  Patient reports that yesterday she woke up, had significant fatigue, felt like she had "no energy."  Patient reports that she has been having chills but denies any fever.  She denies any nasal congestion, sore throat, cough, shortness of breath, domino pain, nausea or vomiting.  No headache, visual changes, chest pain.  Patient has not tried any medications for same.  She states that she does have a history of anemia and did require blood transfusion 3 years ago for significant/profound anemia.  Patient reports that her only symptom at that time was also fatigue.         Past Medical History:  Diagnosis Date  . Anemia   . Chronic headaches   . Gastric ulcer     Patient Active Problem List   Diagnosis Date Noted  . Anemia 01/29/2016    Past Surgical History:  Procedure Laterality Date  . c-section    . CHOLECYSTECTOMY    . ESOPHAGOGASTRODUODENOSCOPY N/A 02/01/2016   Procedure: ESOPHAGOGASTRODUODENOSCOPY (EGD);  Surgeon: [name], MD;  Location: [facility] ENDOSCOPY;  Service: Endoscopy;  Laterality: N/A;  . ESOPHAGOGASTRODUODENOSCOPY (EGD) WITH PROPOFOL N/A 04/25/2016   Procedure: ESOPHAGOGASTRODUODENOSCOPY (EGD) WITH PROPOFOL;  Surgeon: [name], MD;  Location: [facility] ENDOSCOPY;  Service: Endoscopy;  Laterality: N/A;  . TONSILLECTOMY    . TUBAL LIGATION      Prior to Admission medications   Medication Sig Start Date End Date Taking? Authorizing Provider  albuterol (VENTOLIN HFA) 108 (90 Base) MCG/ACT inhaler Inhale 2 puffs into the lungs every 4 (four) hours as  needed for wheezing or shortness of breath. 06/02/19   Triplett, Johnette Abraham B, FNP  amitriptyline (ELAVIL) 25 MG tablet Take 1 tablet (25 mg total) by mouth at bedtime. 02/02/16   [name], MD  ferrous sulfate 325 (65 FE) MG tablet Take 1 tablet (325 mg total) by mouth 3 (three) times daily with meals. 02/02/16   [name], MD  pantoprazole (PROTONIX) 40 MG tablet Take 1 tablet (40 mg total) by mouth 2 (two) times daily before a meal. 02/02/16   [name], MD  predniSONE (DELTASONE) 10 MG tablet Take 5 tablets (50 mg total) by mouth daily. 06/02/19   Victorino Dike, FNP    Allergies Keflex [cephalexin]  Family History  Family history unknown: Yes    Social History Social History   Tobacco Use  . Smoking status: Current Every Day Smoker    Packs/day: 1.00  . Smokeless tobacco: Never Used  Substance Use Topics  . Alcohol use: No    Alcohol/week: 0.0 standard drinks  . Drug use: No     Review of Systems  Constitutional: No fever but positive for chills.  Positive for fatigue. Eyes: No visual changes. No discharge ENT: No upper respiratory complaints. Cardiovascular: no chest pain. Respiratory: no cough. No SOB. Gastrointestinal: No abdominal pain.  No nausea, no vomiting.  No diarrhea.  No constipation. Genitourinary: Negative for dysuria. No hematuria Musculoskeletal: Negative for musculoskeletal pain. Skin: Negative for rash, abrasions, lacerations, ecchymosis. Neurological: Negative for headaches, focal weakness or numbness. 10-point ROS otherwise negative.  ____________________________________________   PHYSICAL  EXAM:  VITAL SIGNS: ED Triage Vitals  Enc Vitals Group     BP 09/10/19 1407 117/71     Pulse Rate 09/10/19 1407 92     Resp 09/10/19 1407 19     Temp 09/10/19 1407 99.7 F (37.6 C)     Temp Source 09/10/19 1407 Oral     SpO2 09/10/19 1407 100 %     Weight 09/10/19 1407 180 lb (81.6 kg)     Height 09/10/19 1407 5\' 3"  (1.6 m)     Head Circumference --      Peak  Flow --      Pain Score 09/10/19 1416 3     Pain Loc --      Pain Edu? --      Excl. in [place]? --      Constitutional: Alert and oriented. Well appearing and in no acute distress. Eyes: Conjunctivae are normal. PERRL. EOMI. Head: Atraumatic. ENT:      Ears: EACs and TMs unremarkable bilaterally      Nose: No congestion/rhinnorhea.      Mouth/Throat: Mucous membranes are moist.  Neck: No stridor.  Neck is supple full range of motion Hematological/Lymphatic/Immunilogical: No cervical lymphadenopathy. Cardiovascular: Normal rate, regular rhythm. Normal S1 and S2.  Good peripheral circulation. Respiratory: Normal respiratory effort without tachypnea or retractions. Lungs CTAB. Good air entry to the bases with no decreased or absent breath sounds. Gastrointestinal: Bowel sounds 4 quadrants. Soft and nontender to palpation. No guarding or rigidity. No palpable masses. No distention. No CVA tenderness. Musculoskeletal: Full range of motion to all extremities. No gross deformities appreciated. Neurologic:  Normal speech and language. No gross focal neurologic deficits are appreciated.  Skin:  Skin is warm, dry and intact. No rash noted. Psychiatric: Mood and affect are normal. Speech and behavior are normal. Patient exhibits appropriate insight and judgement.   ____________________________________________   LABS (all labs ordered are listed, but only abnormal results are displayed)  Labs Reviewed  BASIC METABOLIC PANEL - Abnormal; Notable for the following components:      Result Value   CO2 21 (*)    All other components within normal limits  CBC - Abnormal; Notable for the following components:   RBC 5.23 (*)    Hemoglobin 11.1 (*)    MCV 72.3 (*)    MCH 21.2 (*)    MCHC 29.4 (*)    RDW 17.8 (*)    All other components within normal limits  SARS CORONAVIRUS 2 (TAT 6-24 HRS)  POC URINE PREG, ED  TYPE AND SCREEN    ____________________________________________  EKG   ____________________________________________  RADIOLOGY I personally viewed and evaluated these images as part of my medical decision making, as well as reviewing the written report by the radiologist.  DG Chest 2 View  Result Date: 09/10/2019 CLINICAL DATA:  Shortness of breath EXAM: CHEST - 2 VIEW COMPARISON:  01/27/2019 FINDINGS: The heart size and mediastinal contours are within normal limits. Both lungs are clear. The visualized skeletal structures are unremarkable. IMPRESSION: No acute abnormality of the lungs. Electronically Signed   By: Eddie Candle M.D.   On: 09/10/2019 14:55    ____________________________________________    PROCEDURES  Procedure(s) performed:    Procedures    Medications  albuterol (PROVENTIL) (2.5 MG/3ML) 0.083% nebulizer solution 5 mg (5 mg Nebulization Given 09/10/19 1546)     ____________________________________________   INITIAL IMPRESSION / ASSESSMENT AND PLAN / ED COURSE  Pertinent labs & imaging results that were available  during my care of the patient were reviewed by me and considered in my medical decision making (see chart for details).  Review of the [place] CSRS was performed in accordance of the [place] prior to dispensing any controlled drugs.           Patient's diagnosis is consistent with fatigue.  Patient presents emergency department with new onset fatigue.  Patient denied any other symptoms other than chills.  Exam was reassuring.  Labs are reassuring at this time.  Patient does have a history of anemia requiring blood transfusion but at this time patient's hemoglobin is 11.1.  She denied any source of bleeding concerning that this would rapidly drop.  Given chills, fatigue I will test the patient for Covid though I do have a low suspicion at this time.  If any new symptoms develop follow-up primary care or return to the emergency department.  No indication for further  work-up at this time.  Patient is stable for discharge..  Patient is given ED precautions to return to the ED for any worsening or new symptoms.     ____________________________________________  FINAL CLINICAL IMPRESSION(S) / ED DIAGNOSES  Final diagnoses:  Fatigue, unspecified type      NEW MEDICATIONS STARTED DURING THIS VISIT:  ED Discharge Orders    None          This chart was dictated using voice recognition software/Dragon. Despite best efforts to proofread, errors can occur which can change the meaning. Any change was purely unintentional.    Darletta Moll, PA-C 09/10/19 1715    [name], MD 09/10/19 [phone]

## 2019-09-11 LAB — SARS CORONAVIRUS 2 (TAT 6-24 HRS): SARS Coronavirus 2: NEGATIVE

## 2019-10-08 ENCOUNTER — Other Ambulatory Visit: Payer: Self-pay

## 2019-10-08 DIAGNOSIS — Z5321 Procedure and treatment not carried out due to patient leaving prior to being seen by health care provider: Secondary | ICD-10-CM | POA: Insufficient documentation

## 2019-10-08 DIAGNOSIS — R519 Headache, unspecified: Secondary | ICD-10-CM | POA: Insufficient documentation

## 2019-10-08 NOTE — ED Triage Notes (Signed)
Patient reports woke Thursday with a headache, especially around eyes and generalized body aches.

## 2019-10-09 ENCOUNTER — Emergency Department
Admission: EM | Admit: 2019-10-09 | Discharge: 2019-10-09 | Disposition: A | Discharge status: Home / Self Care | Payer: HRSA Program | Attending: Emergency Medicine | Admitting: Emergency Medicine

## 2019-10-09 ENCOUNTER — Other Ambulatory Visit: Payer: Self-pay

## 2019-10-09 ENCOUNTER — Emergency Department
Admission: EM | Admit: 2019-10-09 | Discharge: 2019-10-09 | Disposition: A | Discharge status: Home / Self Care | Payer: Self-pay | Attending: Emergency Medicine | Admitting: Emergency Medicine

## 2019-10-09 ENCOUNTER — Emergency Department: Payer: HRSA Program

## 2019-10-09 ENCOUNTER — Encounter: Payer: Self-pay | Admitting: Emergency Medicine

## 2019-10-09 DIAGNOSIS — F17210 Nicotine dependence, cigarettes, uncomplicated: Secondary | ICD-10-CM | POA: Insufficient documentation

## 2019-10-09 DIAGNOSIS — Z20822 Contact with and (suspected) exposure to covid-19: Secondary | ICD-10-CM

## 2019-10-09 DIAGNOSIS — Z79899 Other long term (current) drug therapy: Secondary | ICD-10-CM | POA: Diagnosis not present

## 2019-10-09 DIAGNOSIS — U071 COVID-19: Secondary | ICD-10-CM | POA: Diagnosis not present

## 2019-10-09 DIAGNOSIS — R438 Other disturbances of smell and taste: Secondary | ICD-10-CM | POA: Diagnosis present

## 2019-10-09 LAB — SARS CORONAVIRUS 2 (TAT 6-24 HRS): SARS Coronavirus 2: POSITIVE — AB

## 2019-10-09 MED ORDER — PREDNISONE 10 MG (21) PO TBPK: ORAL_TABLET | ORAL | 0 refills | Status: DC

## 2019-10-09 MED ORDER — AZITHROMYCIN 250 MG PO TABS: ORAL_TABLET | ORAL | 0 refills | Status: DC

## 2019-10-09 NOTE — ED Notes (Signed)
Patient called for a room with no answer. 

## 2019-10-09 NOTE — ED Provider Notes (Signed)
[facility] Emergency Department Provider Note  ____________________________________________   First MD Initiated Contact with Patient 10/09/19 1558     (approximate)  I have reviewed the triage vital signs and the nursing notes.   HISTORY  Chief Complaint Headache, Cough, and Nasal Congestion    HPI ZARETH REZNICEK is a 34 y.o. female presents emergency department Covid-like symptoms.  Patient states she lost her sense of taste and smell.  Had a dry cough and headache.  Some nasal congestion ear pain.  No vomiting or diarrhea.    Past Medical History:  Diagnosis Date  . Anemia   . Chronic headaches   . Gastric ulcer     Patient Active Problem List   Diagnosis Date Noted  . Anemia 01/29/2016    Past Surgical History:  Procedure Laterality Date  . c-section    . CHOLECYSTECTOMY    . ESOPHAGOGASTRODUODENOSCOPY N/A 02/01/2016   Procedure: ESOPHAGOGASTRODUODENOSCOPY (EGD);  Surgeon: [name], MD;  Location: [facility] ENDOSCOPY;  Service: Endoscopy;  Laterality: N/A;  . ESOPHAGOGASTRODUODENOSCOPY (EGD) WITH PROPOFOL N/A 04/25/2016   Procedure: ESOPHAGOGASTRODUODENOSCOPY (EGD) WITH PROPOFOL;  Surgeon: [name], MD;  Location: [facility] ENDOSCOPY;  Service: Endoscopy;  Laterality: N/A;  . TONSILLECTOMY    . TUBAL LIGATION      Prior to Admission medications   Medication Sig Start Date End Date Taking? Authorizing Provider  albuterol (VENTOLIN HFA) 108 (90 Base) MCG/ACT inhaler Inhale 2 puffs into the lungs every 4 (four) hours as needed for wheezing or shortness of breath. 06/02/19   Triplett, Johnette Abraham B, FNP  amitriptyline (ELAVIL) 25 MG tablet Take 1 tablet (25 mg total) by mouth at bedtime. 02/02/16   [name], MD  azithromycin (ZITHROMAX Z-PAK) 250 MG tablet 2 pills today then 1 pill a day for 4 days 10/09/19   Caryn Section Linden Dolin, PA-C  ferrous sulfate 325 (65 FE) MG tablet Take 1 tablet (325 mg total) by mouth 3 (three) times daily with meals.  02/02/16   [name], MD  pantoprazole (PROTONIX) 40 MG tablet Take 1 tablet (40 mg total) by mouth 2 (two) times daily before a meal. 02/02/16   [name], MD  predniSONE (STERAPRED UNI-PAK 21 TAB) 10 MG (21) TBPK tablet Take 6 pills on day one then decrease by 1 pill each day 10/09/19   Versie Starks, PA-C    Allergies Keflex [cephalexin]  Family History  Family history unknown: Yes    Social History Social History   Tobacco Use  . Smoking status: Current Every Day Smoker    Packs/day: 1.00  . Smokeless tobacco: Never Used  Substance Use Topics  . Alcohol use: No    Alcohol/week: 0.0 standard drinks  . Drug use: No    Review of Systems  Constitutional: No fever/chills , headache Eyes: No visual changes. ENT: No sore throat.  Positive nasal congestion, no sense of taste or smell Respiratory: Positive cough Cardiovascular: Denies chest pain Gastrointestinal: Denies abdominal pain Genitourinary: Negative for dysuria. Musculoskeletal: Negative for back pain. Skin: Negative for rash. Psychiatric: no mood changes,     ____________________________________________   PHYSICAL EXAM:  VITAL SIGNS: ED Triage Vitals  Enc Vitals Group     BP 10/09/19 1534 (!) 128/55     Pulse Rate 10/09/19 1534 93     Resp 10/09/19 1534 18     Temp 10/09/19 1534 98.5 F (36.9 C)     Temp Source 10/09/19 1534 Oral  SpO2 10/09/19 1534 100 %     Weight 10/09/19 1537 180 lb (81.6 kg)     Height 10/09/19 1537 5\' 3"  (1.6 m)     Head Circumference --      Peak Flow --      Pain Score 10/09/19 1532 5     Pain Loc --      Pain Edu? --      Excl. in [place]? --     Constitutional: Alert and oriented. Well appearing and in no acute distress. Eyes: Conjunctivae are normal.  Head: Atraumatic. Nose: No congestion/rhinnorhea. Mouth/Throat: Mucous membranes are moist.   Neck:  supple no lymphadenopathy noted Cardiovascular: Normal rate, regular rhythm. Heart sounds are normal Respiratory:  Normal respiratory effort.  No retractions, lungs c t a  GU: deferred Musculoskeletal: FROM all extremities, warm and well perfused Neurologic:  Normal speech and language.  Skin:  Skin is warm, dry and intact. No rash noted. Psychiatric: Mood and affect are normal. Speech and behavior are normal.  ____________________________________________   LABS (all labs ordered are listed, but only abnormal results are displayed)  Labs Reviewed  SARS CORONAVIRUS 2 (TAT 6-24 HRS)   ____________________________________________   ____________________________________________  RADIOLOGY  Chest x-ray is normal  ____________________________________________   PROCEDURES  Procedure(s) performed: No  Procedures    ____________________________________________   INITIAL IMPRESSION / ASSESSMENT AND PLAN / ED COURSE  Pertinent labs & imaging results that were available during my care of the patient were reviewed by me and considered in my medical decision making (see chart for details).   Patient is a 34 year old female presents emergency department Covid-like symptoms.  Physical exam shows patient appears stable.  Vitals are basically normal.  Exam is basically unremarkable  Chest x-ray Covid test   Chest x-ray is normal.  Explained findings to the patient.  Explained to her that since she has lost her sense of taste and smell she has Covid.  Her test will be back in 6 to 24 hours.  However with the symptoms she should continue to quarantine for an additional 10 days.  States she understands will comply.  She is discharged stable condition.  MONTA ZISMAN was evaluated in Emergency Department on 10/09/2019 for the symptoms described in the history of present illness. She was evaluated in the context of the global COVID-19 pandemic, which necessitated consideration that the patient might be at risk for infection with the SARS-CoV-2 virus that causes COVID-19. Institutional protocols and  algorithms that pertain to the evaluation of patients at risk for COVID-19 are in a state of rapid change based on information released by regulatory bodies including the CDC and federal and state organizations. These policies and algorithms were followed during the patient's care in the ED.   As part of my medical decision making, I reviewed the following data within the [place] notes reviewed and incorporated, Old chart reviewed, Radiograph reviewed chest x-ray is negative, Notes from prior ED visits and [place] Controlled Substance Database  ____________________________________________   FINAL CLINICAL IMPRESSION(S) / ED DIAGNOSES  Final diagnoses:  Suspected COVID-19 virus infection      NEW MEDICATIONS STARTED DURING THIS VISIT:  New Prescriptions   AZITHROMYCIN (ZITHROMAX Z-PAK) 250 MG TABLET    2 pills today then 1 pill a day for 4 days   PREDNISONE (STERAPRED UNI-PAK 21 TAB) 10 MG (21) TBPK TABLET    Take 6 pills on day one then decrease by 1 pill each day  Note:  This document was prepared using Dragon voice recognition software and may include unintentional dictation errors.    Versie Starks, PA-C 10/09/19 1709    Lavonia Drafts, MD 10/09/19 1757

## 2019-10-09 NOTE — Discharge Instructions (Signed)
Follow-up with your regular doctor if not better in 3 days.  Return emergency department if worsening.  You should stay quarantined for an additional 10 days.  Your Covid test results will be available in 6 to 24 hours.  Take medication as prescribed.

## 2019-10-09 NOTE — ED Triage Notes (Signed)
Pt arrived via POV with reports headache, cough, nasal congestion, body aches, pt states she lost her sense of smell and taste.  Denies any COVID contacts.

## 2019-10-09 NOTE — ED Notes (Signed)
Called for room with no answers

## 2019-10-09 NOTE — ED Notes (Signed)
Pt called from [place] to tx rm with no answer. Will try again.

## 2019-10-10 ENCOUNTER — Telehealth: Payer: Self-pay

## 2019-10-10 NOTE — Telephone Encounter (Signed)
Notified patient of positive SARS Coronavirus 2 test result. Reviewed with patient quarantine and precautions. Patient verbalized understanding.

## 2020-03-20 ENCOUNTER — Encounter: Payer: Self-pay | Admitting: Emergency Medicine

## 2020-03-20 ENCOUNTER — Emergency Department: Payer: Self-pay

## 2020-03-20 ENCOUNTER — Other Ambulatory Visit: Payer: Self-pay

## 2020-03-20 ENCOUNTER — Emergency Department
Admission: EM | Admit: 2020-03-20 | Discharge: 2020-03-21 | Disposition: A | Discharge status: Home / Self Care | Payer: Self-pay | Attending: Emergency Medicine | Admitting: Emergency Medicine

## 2020-03-20 DIAGNOSIS — R102 Pelvic and perineal pain: Secondary | ICD-10-CM

## 2020-03-20 DIAGNOSIS — D259 Leiomyoma of uterus, unspecified: Secondary | ICD-10-CM | POA: Insufficient documentation

## 2020-03-20 DIAGNOSIS — D251 Intramural leiomyoma of uterus: Secondary | ICD-10-CM

## 2020-03-20 DIAGNOSIS — F17210 Nicotine dependence, cigarettes, uncomplicated: Secondary | ICD-10-CM | POA: Insufficient documentation

## 2020-03-20 DIAGNOSIS — N939 Abnormal uterine and vaginal bleeding, unspecified: Secondary | ICD-10-CM | POA: Insufficient documentation

## 2020-03-20 DIAGNOSIS — Z79899 Other long term (current) drug therapy: Secondary | ICD-10-CM | POA: Insufficient documentation

## 2020-03-20 LAB — COMPREHENSIVE METABOLIC PANEL
ALT: 14 U/L (ref 0–44)
AST: 18 U/L (ref 15–41)
Albumin: 4.2 g/dL (ref 3.5–5.0)
Alkaline Phosphatase: 94 U/L (ref 38–126)
Anion gap: 7 (ref 5–15)
BUN: 10 mg/dL (ref 6–20)
CO2: 24 mmol/L (ref 22–32)
Calcium: 8.8 mg/dL — ABNORMAL LOW (ref 8.9–10.3)
Chloride: 106 mmol/L (ref 98–111)
Creatinine, Ser: 0.88 mg/dL (ref 0.44–1.00)
GFR calc Af Amer: >60 mL/min (ref >60)
GFR calc non Af Amer: >60 mL/min (ref >60)
Glucose, Bld: 132 mg/dL — ABNORMAL HIGH (ref 70–99)
Potassium: 3.6 mmol/L (ref 3.5–5.1)
Sodium: 137 mmol/L (ref 135–145)
Total Bilirubin: 0.4 mg/dL (ref 0.3–1.2)
Total Protein: 7.1 g/dL (ref 6.5–8.1)

## 2020-03-20 LAB — URINALYSIS, COMPLETE (UACMP) WITH MICROSCOPIC
Bacteria, UA: NONE SEEN
Bilirubin Urine: NEGATIVE
Glucose, UA: NEGATIVE mg/dL
Ketones, ur: NEGATIVE mg/dL
Leukocytes,Ua: NEGATIVE
Nitrite: NEGATIVE
Protein, ur: NEGATIVE mg/dL
Specific Gravity, Urine: 1.026 (ref 1.005–1.030)
pH: 5 (ref 5.0–8.0)

## 2020-03-20 LAB — CBC
HCT: 34.4 % — ABNORMAL LOW (ref 36.0–46.0)
Hemoglobin: 10.4 g/dL — ABNORMAL LOW (ref 12.0–15.0)
MCH: 21.1 pg — ABNORMAL LOW (ref 26.0–34.0)
MCHC: 30.2 g/dL (ref 30.0–36.0)
MCV: 69.9 fL — ABNORMAL LOW (ref 80.0–100.0)
Platelets: 251 10*3/uL (ref 150–400)
RBC: 4.92 MIL/uL (ref 3.87–5.11)
RDW: 18.9 % — ABNORMAL HIGH (ref 11.5–15.5)
WBC: 7 10*3/uL (ref 4.0–10.5)
nRBC: 0 % (ref 0.0–0.2)

## 2020-03-20 LAB — POCT PREGNANCY, URINE: Preg Test, Ur: NEGATIVE

## 2020-03-20 MED ORDER — DICYCLOMINE HCL 10 MG PO CAPS: 10.0000 mg | ORAL_CAPSULE | Freq: Four times a day (QID) | ORAL | 0 refills | Status: DC | PRN

## 2020-03-20 MED ORDER — IRON 325 (65 FE) MG PO TABS: 1.0000 | ORAL_TABLET | Freq: Every day | ORAL | 0 refills | Status: DC

## 2020-03-20 MED ORDER — TRAMADOL HCL 50 MG PO TABS: 50.0000 mg | ORAL_TABLET | Freq: Four times a day (QID) | ORAL | 0 refills | Status: DC | PRN

## 2020-03-20 NOTE — ED Triage Notes (Signed)
Pt to ED from home c/o vaginal bleeding.  States started period on Sunday and heavier than normal.  States changing positions, mainly sitting to standing pt feels light headed.  Also states mild abd cramping.  A&Ox4, skin color WNL, chest rise even and unlabored, in NAD at this time.

## 2020-03-20 NOTE — ED Provider Notes (Signed)
[facility] Emergency Department Provider Note  ____________________________________________  Time seen: Approximately 11:00 PM  I have reviewed the triage vital signs and the nursing notes.   HISTORY  Chief Complaint Vaginal Bleeding    HPI Faith Ellis is a 34 y.o. female with a history of iron deficiency anemia who comes ED complaining of vaginal bleeding for the last 3 days, was heavier yesterday but started to improve today, going for about 1 pad every 2 hours.  Associate with some lightheadedness on standing, but she is eating and drinking normally.  No syncope or palpitations.  No chest pain or shortness of breath or fever.  No dysuria.  Reports that she usually gets heavy periods but this seems particularly bad.  Does not take any hormonal contraceptives.  She is status post BTL in the past.     Past Medical History:  Diagnosis Date   Anemia    Chronic headaches    Gastric ulcer      Patient Active Problem List   Diagnosis Date Noted   Anemia 01/29/2016     Past Surgical History:  Procedure Laterality Date   c-section     CHOLECYSTECTOMY     ESOPHAGOGASTRODUODENOSCOPY N/A 02/01/2016   Procedure: ESOPHAGOGASTRODUODENOSCOPY (EGD);  Surgeon: [name], MD;  Location: [facility] ENDOSCOPY;  Service: Endoscopy;  Laterality: N/A;   ESOPHAGOGASTRODUODENOSCOPY (EGD) WITH PROPOFOL N/A 04/25/2016   Procedure: ESOPHAGOGASTRODUODENOSCOPY (EGD) WITH PROPOFOL;  Surgeon: [name], MD;  Location: [facility] ENDOSCOPY;  Service: Endoscopy;  Laterality: N/A;   TONSILLECTOMY     TUBAL LIGATION       Prior to Admission medications   Medication Sig Start Date End Date Taking? Authorizing Provider  albuterol (VENTOLIN HFA) 108 (90 Base) MCG/ACT inhaler Inhale 2 puffs into the lungs every 4 (four) hours as needed for wheezing or shortness of breath. 06/02/19   Triplett, Johnette Abraham B, FNP  amitriptyline (ELAVIL) 25 MG tablet Take 1 tablet (25 mg  total) by mouth at bedtime. 02/02/16   [name], MD  azithromycin (ZITHROMAX Z-PAK) 250 MG tablet 2 pills today then 1 pill a day for 4 days 10/09/19   Caryn Section Linden Dolin, PA-C  dicyclomine (BENTYL) 10 MG capsule Take 1 capsule (10 mg total) by mouth every 6 (six) hours as needed for up to 5 days for spasms (abdominal pain). 03/20/20 03/25/20  Carrie Mew, MD  Ferrous Sulfate (IRON) 325 (65 Fe) MG TABS Take 1 tablet (325 mg total) by mouth daily. 03/20/20 03/20/21  Carrie Mew, MD  pantoprazole (PROTONIX) 40 MG tablet Take 1 tablet (40 mg total) by mouth 2 (two) times daily before a meal. 02/02/16   [name], MD  predniSONE (STERAPRED UNI-PAK 21 TAB) 10 MG (21) TBPK tablet Take 6 pills on day one then decrease by 1 pill each day 10/09/19   Versie Starks, PA-C  traMADol (ULTRAM) 50 MG tablet Take 1 tablet (50 mg total) by mouth every 6 (six) hours as needed. 03/20/20   Carrie Mew, MD     Allergies Keflex [cephalexin]   Family History  Family history unknown: Yes    Social History Social History   Tobacco Use   Smoking status: Current Every Day Smoker    Packs/day: 1.00   Smokeless tobacco: Never Used  Vaping Use   Vaping Use: Never used  Substance Use Topics   Alcohol use: No    Alcohol/week: 0.0 standard drinks   Drug use: No    Review of Systems  Constitutional:   No fever or chills.  ENT:   No sore throat. No rhinorrhea. Cardiovascular:   No chest pain or syncope. Respiratory:   No dyspnea or cough. Gastrointestinal: Positive pelvic cramping pain.  No vomiting constipation or diarrhea.  Musculoskeletal:   Negative for focal pain or swelling All other systems reviewed and are negative except as documented above in ROS and HPI.  ____________________________________________   PHYSICAL EXAM:  VITAL SIGNS: ED Triage Vitals  Enc Vitals Group     BP 03/20/20 1925 126/67     Pulse Rate 03/20/20 1925 87     Resp 03/20/20 1925 16     Temp 03/20/20 1925  99.4 F (37.4 C)     Temp Source 03/20/20 1925 Oral     SpO2 03/20/20 1925 100 %     Weight 03/20/20 1925 180 lb (81.6 kg)     Height 03/20/20 1925 5\' 3"  (1.6 m)     Head Circumference --      Peak Flow --      Pain Score 03/20/20 1930 4     Pain Loc --      Pain Edu? --      Excl. in [place]? --     Vital signs reviewed, nursing assessments reviewed.   Constitutional:   Alert and oriented. Non-toxic appearance. Eyes:   Conjunctivae are normal. EOMI. PERRL. ENT      Head:   Normocephalic and atraumatic.      Nose:   Wearing a mask.      Mouth/Throat:   Wearing a mask.      Neck:   No meningismus. Full ROM. Hematological/Lymphatic/Immunilogical:   No cervical lymphadenopathy. Cardiovascular:   RRR. Symmetric bilateral radial and DP pulses.  No murmurs. Cap refill less than 2 seconds. Respiratory:   Normal respiratory effort without tachypnea/retractions. Breath sounds are clear and equal bilaterally. No wheezes/rales/rhonchi. Gastrointestinal:   Soft with suprapubic tenderness. Non distended. There is no CVA tenderness.  No rebound, rigidity, or guarding. Musculoskeletal:   Normal range of motion in all extremities. No joint effusions.  No lower extremity tenderness.  No edema. Neurologic:   Normal speech and language.  Motor grossly intact. No acute focal neurologic deficits are appreciated.  Skin:    Skin is warm, dry and intact. No rash noted.  No petechiae, purpura, or bullae.  ____________________________________________    LABS (pertinent positives/negatives) (all labs ordered are listed, but only abnormal results are displayed) Labs Reviewed  CBC - Abnormal; Notable for the following components:      Result Value   Hemoglobin 10.4 (*)    HCT 34.4 (*)    MCV 69.9 (*)    MCH 21.1 (*)    RDW 18.9 (*)    All other components within normal limits  COMPREHENSIVE METABOLIC PANEL - Abnormal; Notable for the following components:   Glucose, Bld 132 (*)    Calcium 8.8 (*)     All other components within normal limits  URINALYSIS, COMPLETE (UACMP) WITH MICROSCOPIC - Abnormal; Notable for the following components:   Color, Urine YELLOW (*)    APPearance CLEAR (*)    Hgb urine dipstick SMALL (*)    All other components within normal limits  POC URINE PREG, ED  POCT PREGNANCY, URINE  SAMPLE TO BLOOD BANK   ____________________________________________   EKG    ____________________________________________    RADIOLOGY  No results found.  ____________________________________________   PROCEDURES Procedures  ____________________________________________    CLINICAL IMPRESSION /  ASSESSMENT AND PLAN / ED COURSE  Medications ordered in the ED: Medications - No data to display  Pertinent labs & imaging results that were available during my care of the patient were reviewed by me and considered in my medical decision making (see chart for details).  Faith Ellis was evaluated in Emergency Department on 03/20/2020 for the symptoms described in the history of present illness. She was evaluated in the context of the global COVID-19 pandemic, which necessitated consideration that the patient might be at risk for infection with the SARS-CoV-2 virus that causes COVID-19. Institutional protocols and algorithms that pertain to the evaluation of patients at risk for COVID-19 are in a state of rapid change based on information released by regulatory bodies including the CDC and federal and state organizations. These policies and algorithms were followed during the patient's care in the ED.   Patient presents with heavy vaginal bleeding.  Pregnancy negative, vital signs are normal, labs are unremarkable except for hemoglobin of 10.5, slightly microcytic consistent with iron deficiency anemia.  I will start her on iron supplementation.  Current presentation likely due to menorrhagia in the setting of menses versus fibroid.  Will obtain pelvic ultrasound, recommend  follow-up with primary care to consider hormonal contraceptive if menstrual symptoms are excessive, follow-up with gynecology if there is a large symptomatic fibroid.  Care signed out to Dr. Owens Shark to follow up on Ultrasound.       ____________________________________________   FINAL CLINICAL IMPRESSION(S) / ED DIAGNOSES    Final diagnoses:  Vaginal bleeding     ED Discharge Orders         Ordered    Ferrous Sulfate (IRON) 325 (65 Fe) MG TABS  Daily     Discontinue  Reprint     03/20/20 2224    traMADol (ULTRAM) 50 MG tablet  Every 6 hours PRN     Discontinue  Reprint     03/20/20 2259    dicyclomine (BENTYL) 10 MG capsule  Every 6 hours PRN     Discontinue  Reprint     03/20/20 2259          Portions of this note were generated with dragon dictation software. Dictation errors may occur despite best attempts at proofreading.   Carrie Mew, MD 03/20/20 [phone]

## 2020-03-20 NOTE — ED Notes (Signed)
Pt being transported to [facility]

## 2020-03-21 MED ORDER — DICYCLOMINE HCL 10 MG PO CAPS: 10.0000 mg | ORAL_CAPSULE | Freq: Four times a day (QID) | ORAL | 0 refills | Status: DC | PRN

## 2020-03-21 MED ORDER — TRAMADOL HCL 50 MG PO TABS: 50.0000 mg | ORAL_TABLET | Freq: Four times a day (QID) | ORAL | 0 refills | Status: DC | PRN

## 2020-03-21 MED ORDER — IRON 325 (65 FE) MG PO TABS: 1.0000 | ORAL_TABLET | Freq: Every day | ORAL | 0 refills | Status: DC

## 2020-04-12 ENCOUNTER — Emergency Department
Admission: EM | Admit: 2020-04-12 | Discharge: 2020-04-12 | Disposition: A | Discharge status: Home / Self Care | Payer: Self-pay | Attending: Emergency Medicine | Admitting: Emergency Medicine

## 2020-04-12 ENCOUNTER — Emergency Department: Payer: Self-pay

## 2020-04-12 ENCOUNTER — Other Ambulatory Visit: Payer: Self-pay

## 2020-04-12 DIAGNOSIS — H66001 Acute suppurative otitis media without spontaneous rupture of ear drum, right ear: Secondary | ICD-10-CM | POA: Insufficient documentation

## 2020-04-12 DIAGNOSIS — F17210 Nicotine dependence, cigarettes, uncomplicated: Secondary | ICD-10-CM | POA: Insufficient documentation

## 2020-04-12 DIAGNOSIS — B349 Viral infection, unspecified: Secondary | ICD-10-CM | POA: Insufficient documentation

## 2020-04-12 DIAGNOSIS — M791 Myalgia, unspecified site: Secondary | ICD-10-CM | POA: Insufficient documentation

## 2020-04-12 DIAGNOSIS — Z20822 Contact with and (suspected) exposure to covid-19: Secondary | ICD-10-CM | POA: Insufficient documentation

## 2020-04-12 DIAGNOSIS — Z7951 Long term (current) use of inhaled steroids: Secondary | ICD-10-CM | POA: Insufficient documentation

## 2020-04-12 MED ORDER — AZITHROMYCIN 250 MG PO TABS: ORAL_TABLET | ORAL | 0 refills | Status: AC

## 2020-04-12 NOTE — ED Provider Notes (Signed)
Emergency Department Provider Note  ____________________________________________  Time seen: Approximately 7:45 PM  I have reviewed the triage vital signs and the nursing notes.   HISTORY  Chief Complaint Cough and Generalized Body Aches   Historian Patient     HPI Faith Ellis is a 34 y.o. female presents to the emergency department with productive cough, rhinorrhea, nasal congestion and body aches for the past 3 days.  Patient recently returned from a beach trip.  She states that her 2 children experienced similar symptoms over the past several days.  She denies shortness of breath or current chest pain.  She endorses tactile fever at home.   Past Medical History:  Diagnosis Date  . Anemia   . Chronic headaches   . Gastric ulcer      Immunizations up to date:  Yes.     Past Medical History:  Diagnosis Date  . Anemia   . Chronic headaches   . Gastric ulcer     Patient Active Problem List   Diagnosis Date Noted  . Anemia 01/29/2016    Past Surgical History:  Procedure Laterality Date  . c-section    . CHOLECYSTECTOMY    . ESOPHAGOGASTRODUODENOSCOPY N/A 02/01/2016   Procedure: ESOPHAGOGASTRODUODENOSCOPY (EGD);  Surgeon: [name], MD;  Location: [facility] ENDOSCOPY;  Service: Endoscopy;  Laterality: N/A;  . ESOPHAGOGASTRODUODENOSCOPY (EGD) WITH PROPOFOL N/A 04/25/2016   Procedure: ESOPHAGOGASTRODUODENOSCOPY (EGD) WITH PROPOFOL;  Surgeon: [name], MD;  Location: [facility] ENDOSCOPY;  Service: Endoscopy;  Laterality: N/A;  . TONSILLECTOMY    . TUBAL LIGATION      Prior to Admission medications   Medication Sig Start Date End Date Taking? Authorizing Provider  albuterol (VENTOLIN HFA) 108 (90 Base) MCG/ACT inhaler Inhale 2 puffs into the lungs every 4 (four) hours as needed for wheezing or shortness of breath. 06/02/19   Triplett, Johnette Abraham B, FNP  amitriptyline (ELAVIL) 25 MG tablet Take 1 tablet (25 mg total) by mouth at bedtime. 02/02/16   [name], MD   azithromycin (ZITHROMAX Z-PAK) 250 MG tablet Take 2 tablets (500 mg) on  Day 1,  followed by 1 tablet (250 mg) once daily on Days 2 through 5. 04/12/20 04/17/20  Lannie Fields, PA-C  dicyclomine (BENTYL) 10 MG capsule Take 1 capsule (10 mg total) by mouth every 6 (six) hours as needed for up to 5 days for spasms (abdominal pain). 03/21/20 03/26/20  Gregor Hams, MD  Ferrous Sulfate (IRON) 325 (65 Fe) MG TABS Take 1 tablet (325 mg total) by mouth daily. 03/21/20 03/21/21  Gregor Hams, MD  pantoprazole (PROTONIX) 40 MG tablet Take 1 tablet (40 mg total) by mouth 2 (two) times daily before a meal. 02/02/16   [name], MD  predniSONE (STERAPRED UNI-PAK 21 TAB) 10 MG (21) TBPK tablet Take 6 pills on day one then decrease by 1 pill each day 10/09/19   Versie Starks, PA-C  traMADol (ULTRAM) 50 MG tablet Take 1 tablet (50 mg total) by mouth every 6 (six) hours as needed. 03/21/20   Gregor Hams, MD    Allergies Keflex [cephalexin]  Family History  Family history unknown: Yes    Social History Social History   Tobacco Use  . Smoking status: Current Every Day Smoker    Packs/day: 1.00  . Smokeless tobacco: Never Used  Vaping Use  . Vaping Use: Never used  Substance Use Topics  . Alcohol use: No    Alcohol/week: 0.0 standard drinks  .  Drug use: No     Review of Systems  Constitutional: No fever/chills Eyes:  No discharge ENT: No upper respiratory complaints. Respiratory: Patient has cough.  Gastrointestinal:   No nausea, no vomiting.  No diarrhea.  No constipation. Musculoskeletal: Negative for musculoskeletal pain. Skin: Negative for rash, abrasions, lacerations, ecchymosis.    ____________________________________________   PHYSICAL EXAM:  VITAL SIGNS: ED Triage Vitals  Enc Vitals Group     BP 04/12/20 1759 125/71     Pulse Rate 04/12/20 1759 87     Resp 04/12/20 1759 16     Temp 04/12/20 1759 98.4 F (36.9 C)     Temp Source 04/12/20 1759 Oral     SpO2  04/12/20 1759 100 %     Weight 04/12/20 1758 170 lb (77.1 kg)     Height 04/12/20 1758 5\' 3"  (1.6 m)     Head Circumference --      Peak Flow --      Pain Score 04/12/20 1758 4     Pain Loc --      Pain Edu? --      Excl. in [place]? --      Constitutional: Alert and oriented. Patient is lying supine. Eyes: Conjunctivae are normal. PERRL. EOMI. Head: Atraumatic. ENT:      Ears: Right TM is erythematous and effused.  Left TM is pearly.      Nose: No congestion/rhinnorhea.      Mouth/Throat: Mucous membranes are moist. Posterior pharynx is mildly erythematous.  Hematological/Lymphatic/Immunilogical: No cervical lymphadenopathy.  Cardiovascular: Normal rate, regular rhythm. Normal S1 and S2.  Good peripheral circulation. Respiratory: Normal respiratory effort without tachypnea or retractions. Lungs CTAB. Good air entry to the bases with no decreased or absent breath sounds. Gastrointestinal: Bowel sounds 4 quadrants. Soft and nontender to palpation. No guarding or rigidity. No palpable masses. No distention. No CVA tenderness. Musculoskeletal: Full range of motion to all extremities. No gross deformities appreciated. Neurologic:  Normal speech and language. No gross focal neurologic deficits are appreciated.  Skin:  Skin is warm, dry and intact. No rash noted. Psychiatric: Mood and affect are normal. Speech and behavior are normal. Patient exhibits appropriate insight and judgement.   ____________________________________________   LABS (all labs ordered are listed, but only abnormal results are displayed)  Labs Reviewed  SARS CORONAVIRUS 2 (TAT 6-24 HRS)  POC URINE PREG, ED   ____________________________________________  EKG   ____________________________________________  RADIOLOGY Unk Pinto, personally viewed and evaluated these images (plain radiographs) as part of my medical decision making, as well as reviewing the written report by the radiologist.  DG Chest 2  View  Result Date: 04/12/2020 CLINICAL DATA:  Cough, chest tightness, congestion, body aches EXAM: CHEST - 2 VIEW COMPARISON:  10/09/2019 FINDINGS: The heart size and mediastinal contours are within normal limits. Both lungs are clear. The visualized skeletal structures are unremarkable. IMPRESSION: No active cardiopulmonary disease. Electronically Signed   By: Randa Ngo M.D.   On: 04/12/2020 18:40    ____________________________________________    PROCEDURES  Procedure(s) performed:     Procedures     Medications - No data to display   ____________________________________________   INITIAL IMPRESSION / ASSESSMENT AND PLAN / ED COURSE  Pertinent labs & imaging results that were available during my care of the patient were reviewed by me and considered in my medical decision making (see chart for details).    Assessment and plan Viral URI Otitis media 34 year old female presents to the  emergency department with acute right ear pain, productive cough and nasal congestion.  Vital signs are reassuring at triage.  On physical exam, patient had no increased work of breathing and no adventitious lung sounds were auscultated.  Chest x-ray revealed no consolidations, opacities or infiltrates.  Right ear is concerning for otitis media and patient has history of middle ear infections in the past.  We will treat patient with azithromycin given cephalosporin allergy.  Rest and hydration were encouraged at home.  A work note was provided at discharge.   ____________________________________________  FINAL CLINICAL IMPRESSION(S) / ED DIAGNOSES  Final diagnoses:  Acute suppurative otitis media of right ear without spontaneous rupture of tympanic membrane, recurrence not specified  Viral syndrome      NEW MEDICATIONS STARTED DURING THIS VISIT:  ED Discharge Orders         Ordered    azithromycin (ZITHROMAX Z-PAK) 250 MG tablet     Discontinue  Reprint     04/12/20 1941               This chart was dictated using voice recognition software/Dragon. Despite best efforts to proofread, errors can occur which can change the meaning. Any change was purely unintentional.     Lannie Fields, PA-C 04/12/20 1950    [name], MD 04/12/20 2019

## 2020-04-12 NOTE — Discharge Instructions (Signed)
Take azithromycin 2 times daily on the first day.  Take 1 tablet daily for the next 4 days. Please stay quarantined until COVID-19 results return.

## 2020-04-12 NOTE — ED Triage Notes (Signed)
Pt arrives to ER c/o of cough, chest tightness, congestion, body aches. Recent trip to the beach. States son and daughter had similar symptoms. Temp of 100 yesterday but no higher per pt. A&O, ambulatory. Symptoms began Tuesday.

## 2020-04-13 LAB — SARS CORONAVIRUS 2 (TAT 6-24 HRS): SARS Coronavirus 2: NEGATIVE

## 2020-05-20 ENCOUNTER — Emergency Department
Admission: EM | Admit: 2020-05-20 | Discharge: 2020-05-20 | Disposition: A | Discharge status: Home / Self Care | Payer: Self-pay | Attending: Emergency Medicine | Admitting: Emergency Medicine

## 2020-05-20 ENCOUNTER — Other Ambulatory Visit: Payer: Self-pay

## 2020-05-20 DIAGNOSIS — R519 Headache, unspecified: Secondary | ICD-10-CM | POA: Insufficient documentation

## 2020-05-20 DIAGNOSIS — R109 Unspecified abdominal pain: Secondary | ICD-10-CM | POA: Insufficient documentation

## 2020-05-20 DIAGNOSIS — Z5321 Procedure and treatment not carried out due to patient leaving prior to being seen by health care provider: Secondary | ICD-10-CM | POA: Insufficient documentation

## 2020-05-20 DIAGNOSIS — R197 Diarrhea, unspecified: Secondary | ICD-10-CM | POA: Insufficient documentation

## 2020-05-20 LAB — COMPREHENSIVE METABOLIC PANEL
ALT: 15 U/L (ref 0–44)
AST: 20 U/L (ref 15–41)
Albumin: 4.5 g/dL (ref 3.5–5.0)
Alkaline Phosphatase: 88 U/L (ref 38–126)
Anion gap: 11 (ref 5–15)
BUN: 8 mg/dL (ref 6–20)
CO2: 24 mmol/L (ref 22–32)
Calcium: 9 mg/dL (ref 8.9–10.3)
Chloride: 103 mmol/L (ref 98–111)
Creatinine, Ser: 0.53 mg/dL (ref 0.44–1.00)
GFR calc Af Amer: >60 mL/min (ref >60)
GFR calc non Af Amer: >60 mL/min (ref >60)
Glucose, Bld: 96 mg/dL (ref 70–99)
Potassium: 3.8 mmol/L (ref 3.5–5.1)
Sodium: 138 mmol/L (ref 135–145)
Total Bilirubin: 0.5 mg/dL (ref 0.3–1.2)
Total Protein: 7.4 g/dL (ref 6.5–8.1)

## 2020-05-20 LAB — CBC
HCT: 37 % (ref 36.0–46.0)
Hemoglobin: 11.4 g/dL — ABNORMAL LOW (ref 12.0–15.0)
MCH: 22.4 pg — ABNORMAL LOW (ref 26.0–34.0)
MCHC: 30.8 g/dL (ref 30.0–36.0)
MCV: 72.7 fL — ABNORMAL LOW (ref 80.0–100.0)
Platelets: 249 10*3/uL (ref 150–400)
RBC: 5.09 MIL/uL (ref 3.87–5.11)
RDW: 19.8 % — ABNORMAL HIGH (ref 11.5–15.5)
WBC: 6.8 10*3/uL (ref 4.0–10.5)
nRBC: 0 % (ref 0.0–0.2)

## 2020-05-20 LAB — URINALYSIS, COMPLETE (UACMP) WITH MICROSCOPIC
Bacteria, UA: NONE SEEN
Bilirubin Urine: NEGATIVE
Glucose, UA: NEGATIVE mg/dL
Ketones, ur: NEGATIVE mg/dL
Leukocytes,Ua: NEGATIVE
Nitrite: NEGATIVE
Protein, ur: NEGATIVE mg/dL
Specific Gravity, Urine: 1.002 — ABNORMAL LOW (ref 1.005–1.030)
WBC, UA: NONE SEEN WBC/hpf (ref 0–5)
pH: 6 (ref 5.0–8.0)

## 2020-05-20 LAB — LIPASE, BLOOD: Lipase: 28 U/L (ref 11–51)

## 2020-05-20 LAB — POCT PREGNANCY, URINE: Preg Test, Ur: NEGATIVE

## 2020-05-20 NOTE — ED Triage Notes (Signed)
Pt comes POV after headache, diarrhea, abdominal pain for a couple of days. 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

## 2020-05-20 NOTE — ED Notes (Signed)
Patient up to stat desk requesting to have COVID test done so she could leave. RN explained to patient that MD's had to evaluate patient and order COVID testing; that they could not be performed without an order. Patient verbalized understanding. Patient encouraged to stay. Patient left.

## 2020-06-11 IMAGING — DX DG CHEST 1V PORT
1 series · 2 of 2 positions shown · non-contrast
Comparison: 09/10/2019

CLINICAL DATA: Cough, headache, body aches

EXAM:
PORTABLE CHEST 1 VIEW

[Series 1: chest ap · 0.14mm/px · 2 of 2 slices shown]
[im 1/2]
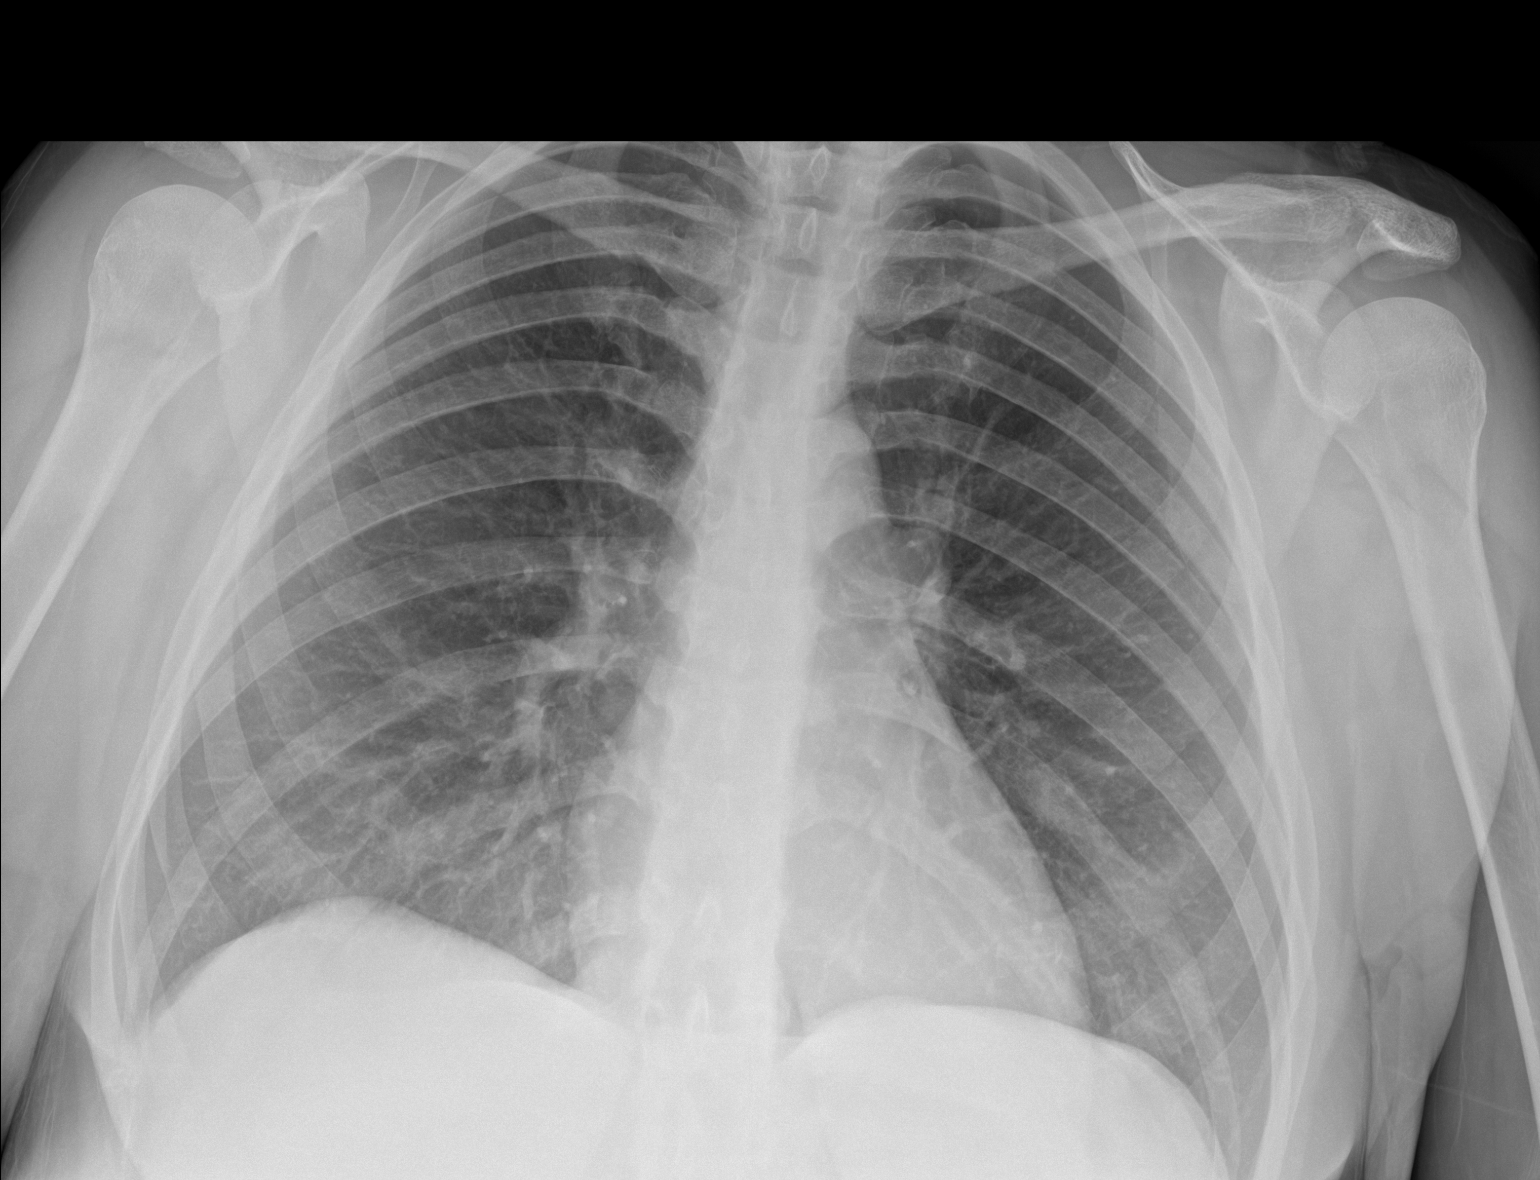
[im 2/2]
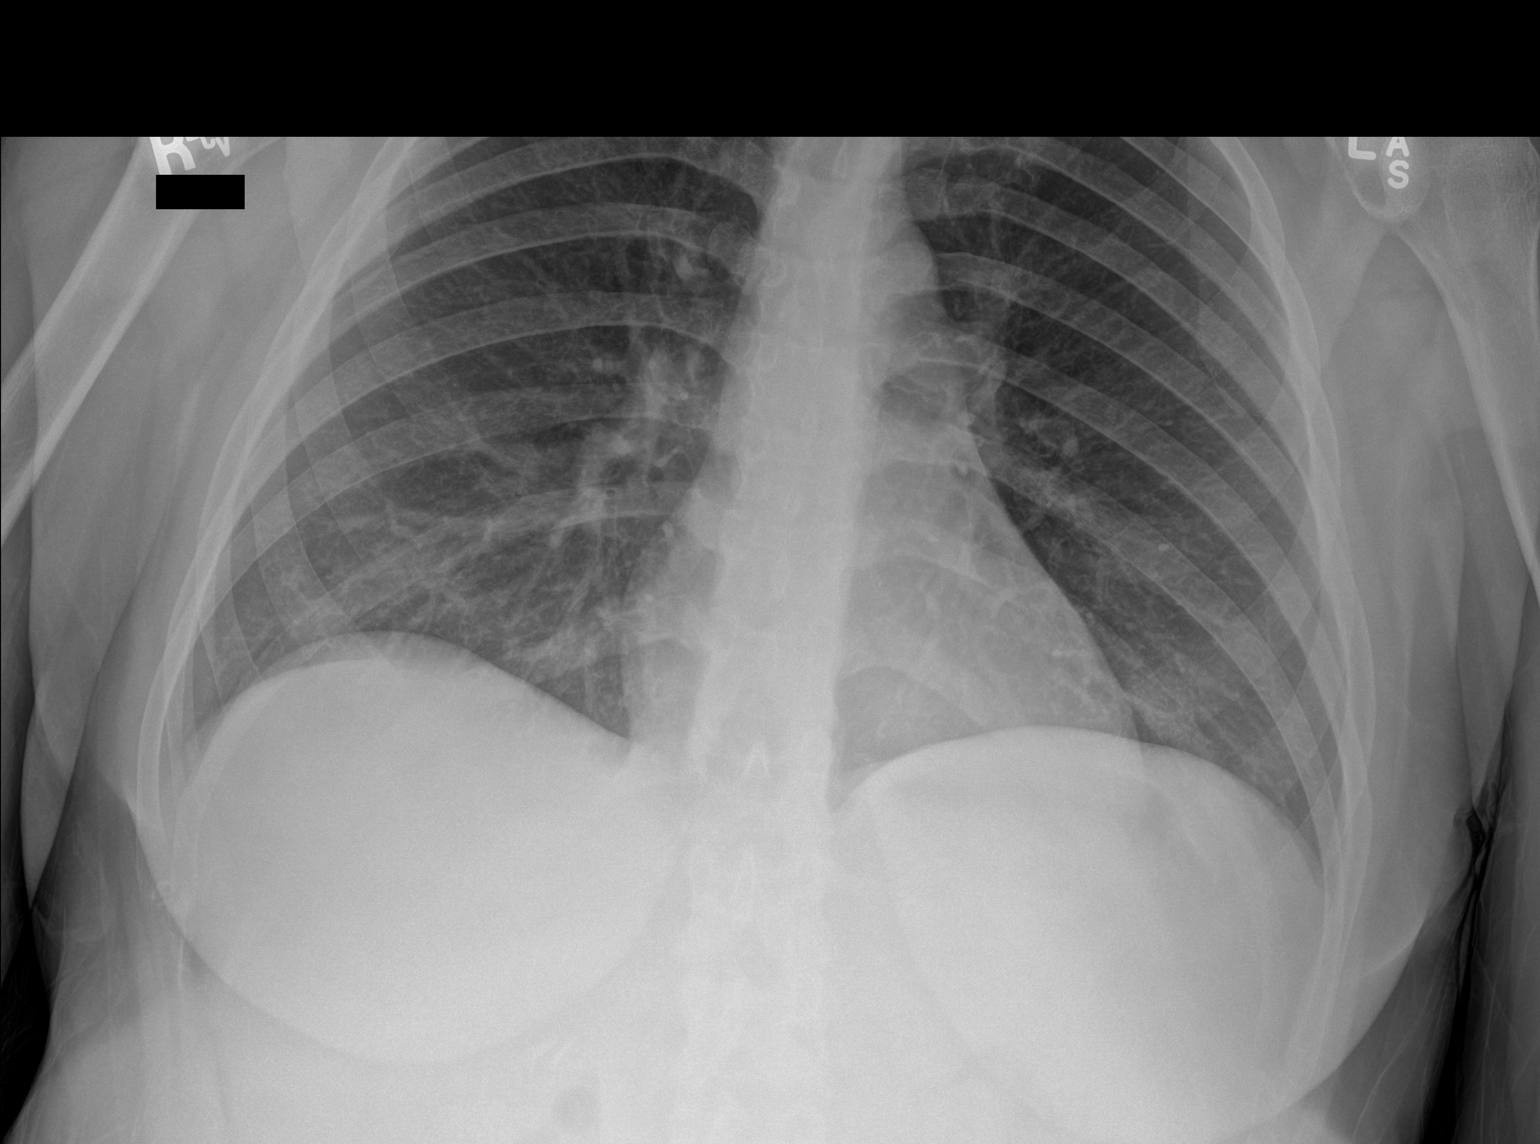

[2 of 2 positions shown; findings below may reference images not displayed]

FINDINGS: The heart size and mediastinal contours are within normal limits.
Both lungs are clear. The visualized skeletal structures are
unremarkable.
IMPRESSION: No active disease.

## 2020-07-10 ENCOUNTER — Other Ambulatory Visit: Payer: Self-pay

## 2020-07-10 ENCOUNTER — Emergency Department
Admission: EM | Admit: 2020-07-10 | Discharge: 2020-07-10 | Disposition: A | Discharge status: Home / Self Care | Payer: No Typology Code available for payment source | Attending: Emergency Medicine | Admitting: Emergency Medicine

## 2020-07-10 ENCOUNTER — Emergency Department: Payer: No Typology Code available for payment source

## 2020-07-10 DIAGNOSIS — M25512 Pain in left shoulder: Secondary | ICD-10-CM

## 2020-07-10 DIAGNOSIS — F17200 Nicotine dependence, unspecified, uncomplicated: Secondary | ICD-10-CM | POA: Insufficient documentation

## 2020-07-10 NOTE — ED Notes (Signed)
Signature pad not working, pt verbalizes understanding of d/c instructions, denies questions or concerns 
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
## 2020-09-05 ENCOUNTER — Emergency Department
Admission: EM | Admit: 2020-09-05 | Discharge: 2020-09-05 | Disposition: A | Discharge status: Home / Self Care | Payer: Self-pay | Attending: Emergency Medicine | Admitting: Emergency Medicine

## 2020-09-05 ENCOUNTER — Other Ambulatory Visit: Payer: Self-pay

## 2020-09-05 ENCOUNTER — Encounter: Payer: Self-pay | Admitting: Intensive Care

## 2020-09-05 DIAGNOSIS — S61412A Laceration without foreign body of left hand, initial encounter: Secondary | ICD-10-CM | POA: Insufficient documentation

## 2020-09-05 DIAGNOSIS — W268XXA Contact with other sharp object(s), not elsewhere classified, initial encounter: Secondary | ICD-10-CM | POA: Insufficient documentation

## 2020-09-05 DIAGNOSIS — Z23 Encounter for immunization: Secondary | ICD-10-CM | POA: Insufficient documentation

## 2020-09-05 DIAGNOSIS — F17210 Nicotine dependence, cigarettes, uncomplicated: Secondary | ICD-10-CM | POA: Insufficient documentation

## 2020-09-05 MED ORDER — TETANUS-DIPHTH-ACELL PERTUSSIS 5-2.5-18.5 LF-MCG/0.5 IM SUSY
0.5000 mL | PREFILLED_SYRINGE | Freq: Once | INTRAMUSCULAR | Status: AC
  Administered 2020-09-05: 0.5 mL via INTRAMUSCULAR
  Filled 2020-09-05: qty 0.5

## 2020-09-05 NOTE — ED Triage Notes (Signed)
Patient has laceration present to left hand

## 2020-09-05 NOTE — ED Provider Notes (Signed)
____________________________________________  Time seen: Approximately 6:20 PM  I have reviewed the triage vital signs and the nursing notes.   HISTORY  Chief Complaint Extremity Laceration   Historian Patient     HPI Faith Ellis is a 34 y.o. female presents to the emergency department with a 1.5 cm laceration along the palmar aspect of the left hand sustained accidentally with a metal can while preparing dinner.  Patient denies numbness or tingling of the left hand.  Bleeding stopped prior to presenting to the emergency department.  No other alleviating measures have been attempted.   Past Medical History:  Diagnosis Date  . Anemia   . Chronic headaches   . Gastric ulcer      Immunizations up to date:  Yes.     Past Medical History:  Diagnosis Date  . Anemia   . Chronic headaches   . Gastric ulcer     Patient Active Problem List   Diagnosis Date Noted  . Anemia 01/29/2016    Past Surgical History:  Procedure Laterality Date  . c-section    . CHOLECYSTECTOMY    . ESOPHAGOGASTRODUODENOSCOPY N/A 02/01/2016   Procedure: ESOPHAGOGASTRODUODENOSCOPY (EGD);  Surgeon: [name], MD;  Location: [facility] ENDOSCOPY;  Service: Endoscopy;  Laterality: N/A;  . ESOPHAGOGASTRODUODENOSCOPY (EGD) WITH PROPOFOL N/A 04/25/2016   Procedure: ESOPHAGOGASTRODUODENOSCOPY (EGD) WITH PROPOFOL;  Surgeon: [name], MD;  Location: [facility] ENDOSCOPY;  Service: Endoscopy;  Laterality: N/A;  . TONSILLECTOMY    . TUBAL LIGATION      Prior to Admission medications   Medication Sig Start Date End Date Taking? Authorizing Provider  albuterol (VENTOLIN HFA) 108 (90 Base) MCG/ACT inhaler Inhale 2 puffs into the lungs every 4 (four) hours as needed for wheezing or shortness of breath. 06/02/19   Triplett, Johnette Abraham B, FNP  amitriptyline (ELAVIL) 25 MG tablet Take 1 tablet (25 mg total) by mouth at bedtime. 02/02/16   [name], MD  dicyclomine (BENTYL) 10 MG capsule Take 1 capsule (10 mg  total) by mouth every 6 (six) hours as needed for up to 5 days for spasms (abdominal pain). 03/21/20 03/26/20  Gregor Hams, MD  Ferrous Sulfate (IRON) 325 (65 Fe) MG TABS Take 1 tablet (325 mg total) by mouth daily. 03/21/20 03/21/21  Gregor Hams, MD  pantoprazole (PROTONIX) 40 MG tablet Take 1 tablet (40 mg total) by mouth 2 (two) times daily before a meal. 02/02/16   [name], MD  predniSONE (STERAPRED UNI-PAK 21 TAB) 10 MG (21) TBPK tablet Take 6 pills on day one then decrease by 1 pill each day 10/09/19   Versie Starks, PA-C  traMADol (ULTRAM) 50 MG tablet Take 1 tablet (50 mg total) by mouth every 6 (six) hours as needed. 03/21/20   Gregor Hams, MD    Allergies Keflex [cephalexin]  Family History  Family history unknown: Yes    Social History Social History   Tobacco Use  . Smoking status: Current Every Day Smoker    Packs/day: 1.00    Types: Cigarettes  . Smokeless tobacco: Never Used  Vaping Use  . Vaping Use: Never used  Substance Use Topics  . Alcohol use: No    Alcohol/week: 0.0 standard drinks  . Drug use: No     Review of Systems  Constitutional: No fever/chills Eyes:  No discharge ENT: No upper respiratory complaints. Respiratory: no cough. No SOB/ use of accessory muscles to breath Gastrointestinal:   No nausea, no vomiting.  No  diarrhea.  No constipation. Musculoskeletal: Negative for musculoskeletal pain. Skin:Patient has laceration of left hand.     ____________________________________________   PHYSICAL EXAM:  VITAL SIGNS: ED Triage Vitals  Enc Vitals Group     BP 09/05/20 1656 128/67     Pulse Rate 09/05/20 1656 82     Resp 09/05/20 1656 18     Temp 09/05/20 1656 98.6 F (37 C)     Temp Source 09/05/20 1656 Oral     SpO2 09/05/20 1656 99 %     Weight 09/05/20 1655 180 lb (81.6 kg)     Height 09/05/20 1655 5\' 3"  (1.6 m)     Head Circumference --      Peak Flow --      Pain Score 09/05/20 1658 3     Pain Loc --      Pain  Edu? --      Excl. in [place]? --      Constitutional: Alert and oriented. Well appearing and in no acute distress. Eyes: Conjunctivae are normal. PERRL. EOMI. Head: Atraumatic. Cardiovascular: Normal rate, regular rhythm. Normal S1 and S2.  Good peripheral circulation. Respiratory: Normal respiratory effort without tachypnea or retractions. Lungs CTAB. Good air entry to the bases with no decreased or absent breath sounds Gastrointestinal: Bowel sounds x 4 quadrants. Soft and nontender to palpation. No guarding or rigidity. No distention. Musculoskeletal: Full range of motion to all extremities. No obvious deformities noted Neurologic:  Normal for age. No gross focal neurologic deficits are appreciated.  Skin: Patient has 1.5 cm laceration along the volar aspect of the left palm.  Laceration is superficial in nature and deep to underlying dermis. Psychiatric: Mood and affect are normal for age. Speech and behavior are normal.   ____________________________________________   LABS (all labs ordered are listed, but only abnormal results are displayed)  Labs Reviewed - No data to display ____________________________________________  EKG   ____________________________________________  RADIOLOGY  No results found.  ____________________________________________    PROCEDURES  Procedure(s) performed:     Marland KitchenMarland KitchenLaceration Repair  Date/Time: 09/05/2020 6:21 PM Performed by: Lannie Fields, PA-C Authorized by: Lannie Fields, PA-C   Consent:    Consent obtained:  Verbal   Consent given by:  Patient Anesthesia (see MAR for exact dosages):    Anesthesia method:  None Laceration details:    Location:  Hand   Hand location:  L palm   Length (cm):  1.5   Depth (mm):  5 Repair type:    Repair type:  Simple Exploration:    Contaminated: no   Treatment:    Area cleansed with:  Saline   Amount of cleaning:  Standard   Irrigation solution:  Sterile saline   Visualized foreign  bodies/material removed: no   Skin repair:    Repair method:  Tissue adhesive Approximation:    Approximation:  Close Post-procedure details:    Dressing:  Open (no dressing)   Patient tolerance of procedure:  Tolerated well, no immediate complications       Medications  Tdap (BOOSTRIX) injection 0.5 mL (0.5 mLs Intramuscular Given 09/05/20 1814)     ____________________________________________   INITIAL IMPRESSION / ASSESSMENT AND PLAN / ED COURSE  Pertinent labs & imaging results that were available during my care of the patient were reviewed by me and considered in my medical decision making (see chart for details).      Assessment and plan Left hand laceration:  34 year old female presents to the emergency department with  a 1.5 cm superficial laceration along the left palm sustained accidentally with a metal can.  Laceration was repaired using Dermabond in the emergency department.  Tetanus status was updated prior to patient being discharged.  Patient education regarding wound care was given.  All patient questions were answered.   ____________________________________________  FINAL CLINICAL IMPRESSION(S) / ED DIAGNOSES  Final diagnoses:  Laceration of left hand without foreign body, initial encounter      NEW MEDICATIONS STARTED DURING THIS VISIT:  ED Discharge Orders    None          This chart was dictated using voice recognition software/Dragon. Despite best efforts to proofread, errors can occur which can change the meaning. Any change was purely unintentional.     Lannie Fields, PA-C 09/05/20 Aurther Loft, MD 09/06/20 [phone]

## 2020-11-28 ENCOUNTER — Emergency Department: Payer: Self-pay

## 2020-11-28 ENCOUNTER — Emergency Department
Admission: EM | Admit: 2020-11-28 | Discharge: 2020-11-28 | Disposition: A | Discharge status: Home / Self Care | Payer: Self-pay | Attending: Emergency Medicine | Admitting: Emergency Medicine

## 2020-11-28 DIAGNOSIS — Z20822 Contact with and (suspected) exposure to covid-19: Secondary | ICD-10-CM | POA: Insufficient documentation

## 2020-11-28 DIAGNOSIS — J069 Acute upper respiratory infection, unspecified: Secondary | ICD-10-CM | POA: Insufficient documentation

## 2020-11-28 DIAGNOSIS — F17210 Nicotine dependence, cigarettes, uncomplicated: Secondary | ICD-10-CM | POA: Insufficient documentation

## 2020-11-28 LAB — RESP PANEL BY RT-PCR (FLU A&B, COVID) ARPGX2
Influenza A by PCR: NEGATIVE
Influenza B by PCR: NEGATIVE
SARS Coronavirus 2 by RT PCR: NEGATIVE

## 2020-11-28 MED ORDER — PREDNISONE 10 MG (21) PO TBPK: ORAL_TABLET | ORAL | 0 refills | Status: DC

## 2020-11-28 MED ORDER — AZITHROMYCIN 250 MG PO TABS: ORAL_TABLET | ORAL | 0 refills | Status: DC

## 2020-11-28 MED ORDER — ALBUTEROL SULFATE HFA 108 (90 BASE) MCG/ACT IN AERS: 2.0000 | INHALATION_SPRAY | Freq: Four times a day (QID) | RESPIRATORY_TRACT | 0 refills | Status: DC | PRN

## 2020-11-28 NOTE — Discharge Instructions (Signed)
Your chest x-ray is normal and reassuring at this time.  You have pending test for Covid and flu that she may monitor with [facility]  MyChart.  Take the prescription meds as directed.

## 2020-11-28 NOTE — ED Provider Notes (Signed)
[facility] Emergency Department Provider Note ____________________________________________  Time seen: 2111  I have reviewed the triage vital signs and the nursing notes.  HISTORY  Chief Complaint  Sore Throat and Cough  HPI Faith Ellis is a 35 y.o. female presents her self to the ED for evaluation of sore throat and cough that began on Saturday.  Patient describes worsening symptoms since that time.  She denies any frank fevers but has noted chills, malaise, fatigue, and a productive cough.  She took a Covid test that she reports was negative.  She denies any sick contacts, but does work at a [employer].   Past Medical History:  Diagnosis Date  . Anemia   . Chronic headaches   . Gastric ulcer     Patient Active Problem List   Diagnosis Date Noted  . Anemia 01/29/2016    Past Surgical History:  Procedure Laterality Date  . c-section    . CHOLECYSTECTOMY    . ESOPHAGOGASTRODUODENOSCOPY N/A 02/01/2016   Procedure: ESOPHAGOGASTRODUODENOSCOPY (EGD);  Surgeon: [name], MD;  Location: [facility] ENDOSCOPY;  Service: Endoscopy;  Laterality: N/A;  . ESOPHAGOGASTRODUODENOSCOPY (EGD) WITH PROPOFOL N/A 04/25/2016   Procedure: ESOPHAGOGASTRODUODENOSCOPY (EGD) WITH PROPOFOL;  Surgeon: [name], MD;  Location: [facility] ENDOSCOPY;  Service: Endoscopy;  Laterality: N/A;  . TONSILLECTOMY    . TUBAL LIGATION      Prior to Admission medications   Medication Sig Start Date End Date Taking? Authorizing Provider  albuterol (VENTOLIN HFA) 108 (90 Base) MCG/ACT inhaler Inhale 2 puffs into the lungs every 6 (six) hours as needed for shortness of breath. 11/28/20  Yes Frazer [place], Dannielle Karvonen, PA-C  azithromycin (ZITHROMAX Z-PAK) 250 MG tablet Take 2 tablets (500 mg) on  Day 1,  followed by 1 tablet (250 mg) once daily on Days 2 through 5. 11/28/20  Yes Marlean Mortell, Dannielle Karvonen, PA-C  predniSONE (STERAPRED UNI-PAK 21 TAB) 10 MG (21) TBPK tablet 6-day taper as  directed. 11/28/20  Yes Salbador Fiveash, Dannielle Karvonen, PA-C  amitriptyline (ELAVIL) 25 MG tablet Take 1 tablet (25 mg total) by mouth at bedtime. 02/02/16   [name], MD  dicyclomine (BENTYL) 10 MG capsule Take 1 capsule (10 mg total) by mouth every 6 (six) hours as needed for up to 5 days for spasms (abdominal pain). 03/21/20 03/26/20  Gregor Hams, MD  Ferrous Sulfate (IRON) 325 (65 Fe) MG TABS Take 1 tablet (325 mg total) by mouth daily. 03/21/20 03/21/21  Gregor Hams, MD  pantoprazole (PROTONIX) 40 MG tablet Take 1 tablet (40 mg total) by mouth 2 (two) times daily before a meal. 02/02/16   [name], MD    Allergies Keflex [cephalexin]  Family History  Family history unknown: Yes    Social History Social History   Tobacco Use  . Smoking status: Current Every Day Smoker    Packs/day: 1.00    Types: Cigarettes  . Smokeless tobacco: Never Used  Vaping Use  . Vaping Use: Never used  Substance Use Topics  . Alcohol use: No    Alcohol/week: 0.0 standard drinks  . Drug use: No    Review of Systems  Constitutional: Negative for fever. Eyes: Negative for visual changes. ENT: Positive for sore throat. Cardiovascular: Negative for chest pain. Respiratory: Negative for shortness of breath. Reports cough as above. Gastrointestinal: Negative for abdominal pain, vomiting and diarrhea. Genitourinary: Negative for dysuria. Musculoskeletal: Negative for back pain. Skin: Negative for rash. Neurological: Negative for headaches, focal  weakness or numbness. ____________________________________________  PHYSICAL EXAM:  VITAL SIGNS: ED Triage Vitals [11/28/20 2052]  Enc Vitals Group     BP 130/64     Pulse Rate (!) 108     Resp 16     Temp 98.2 F (36.8 C)     Temp Source Oral     SpO2 100 %     Weight 180 lb (81.6 kg)     Height 5\' 3"  (1.6 m)     Head Circumference      Peak Flow      Pain Score 4     Pain Loc      Pain Edu?      Excl. in [place]?     Constitutional: Alert and  oriented. Well appearing and in no distress. Head: Normocephalic and atraumatic. Eyes: Conjunctivae are normal. Normal extraocular movements Ears: Canals clear. TMs intact bilaterally. Nose: No congestion/rhinorrhea/epistaxis.  Nasal turbinates are erythematous and edematous. Mouth/Throat: Mucous membranes are moist.  Uvula is midline and tonsils are flat.  No oropharyngeal lesions appreciated. Neck: Supple. No thyromegaly. Hematological/Lymphatic/Immunological: No cervical lymphadenopathy. Cardiovascular: Normal rate, regular rhythm. Normal distal pulses. Respiratory: Normal respiratory effort. No wheezes/rales/rhonchi. Gastrointestinal: Soft and nontender. No distention. Musculoskeletal: Nontender with normal range of motion in all extremities.  Neurologic:  Normal gait without ataxia. Normal speech and language. No gross focal neurologic deficits are appreciated. Skin:  Skin is warm, dry and intact. No rash noted. ____________________________________________   LABS (pertinent positives/negatives) Labs Reviewed  RESP PANEL BY RT-PCR (FLU A&B, COVID) ARPGX2  ____________________________________________   RADIOLOGY  CXR  Negative ____________________________________________  PROCEDURES   Procedures ____________________________________________  INITIAL IMPRESSION / ASSESSMENT AND PLAN / ED COURSE  Patient ED evaluation of symptoms including a persistent productive cough, malaise, and sore throat.  She was evaluated for her symptoms and found to have no acute intrathoracic process on her chest x-ray.  Patient will be treated empirically for bronchitis with prednisone, azithromycin, albuterol, and Flonase.  She will also await results of her Covid and flu swabs at this time.  Work note is provided for 2 days as requested.   Faith Ellis was evaluated in Emergency Department on 11/28/2020 for the symptoms described in the history of present illness. She was evaluated in the  context of the global COVID-19 pandemic, which necessitated consideration that the patient might be at risk for infection with the SARS-CoV-2 virus that causes COVID-19. Institutional protocols and algorithms that pertain to the evaluation of patients at risk for COVID-19 are in a state of rapid change based on information released by regulatory bodies including the CDC and federal and state organizations. These policies and algorithms were followed during the patient's care in the ED. ____________________________________________  FINAL CLINICAL IMPRESSION(S) / ED DIAGNOSES  Final diagnoses:  Viral URI with cough      Kimbley Sprague, Dannielle Karvonen, PA-C 11/28/20 2325    Blake Divine, MD 11/28/20 2330

## 2020-11-28 NOTE — ED Triage Notes (Signed)
Pt states on Saturday she began having throat discomfort and a cough that has been getting worse since.

## 2020-12-14 IMAGING — CR DG CHEST 2V
1 series · 2 of 2 positions shown · non-contrast
Comparison: 10/09/2019

CLINICAL DATA: Cough, chest tightness, congestion, body aches

EXAM:
CHEST - 2 VIEW

[Series 1: dg chest 2 view · 0.14mm/px · 2 of 2 slices shown]
[im 1/2]
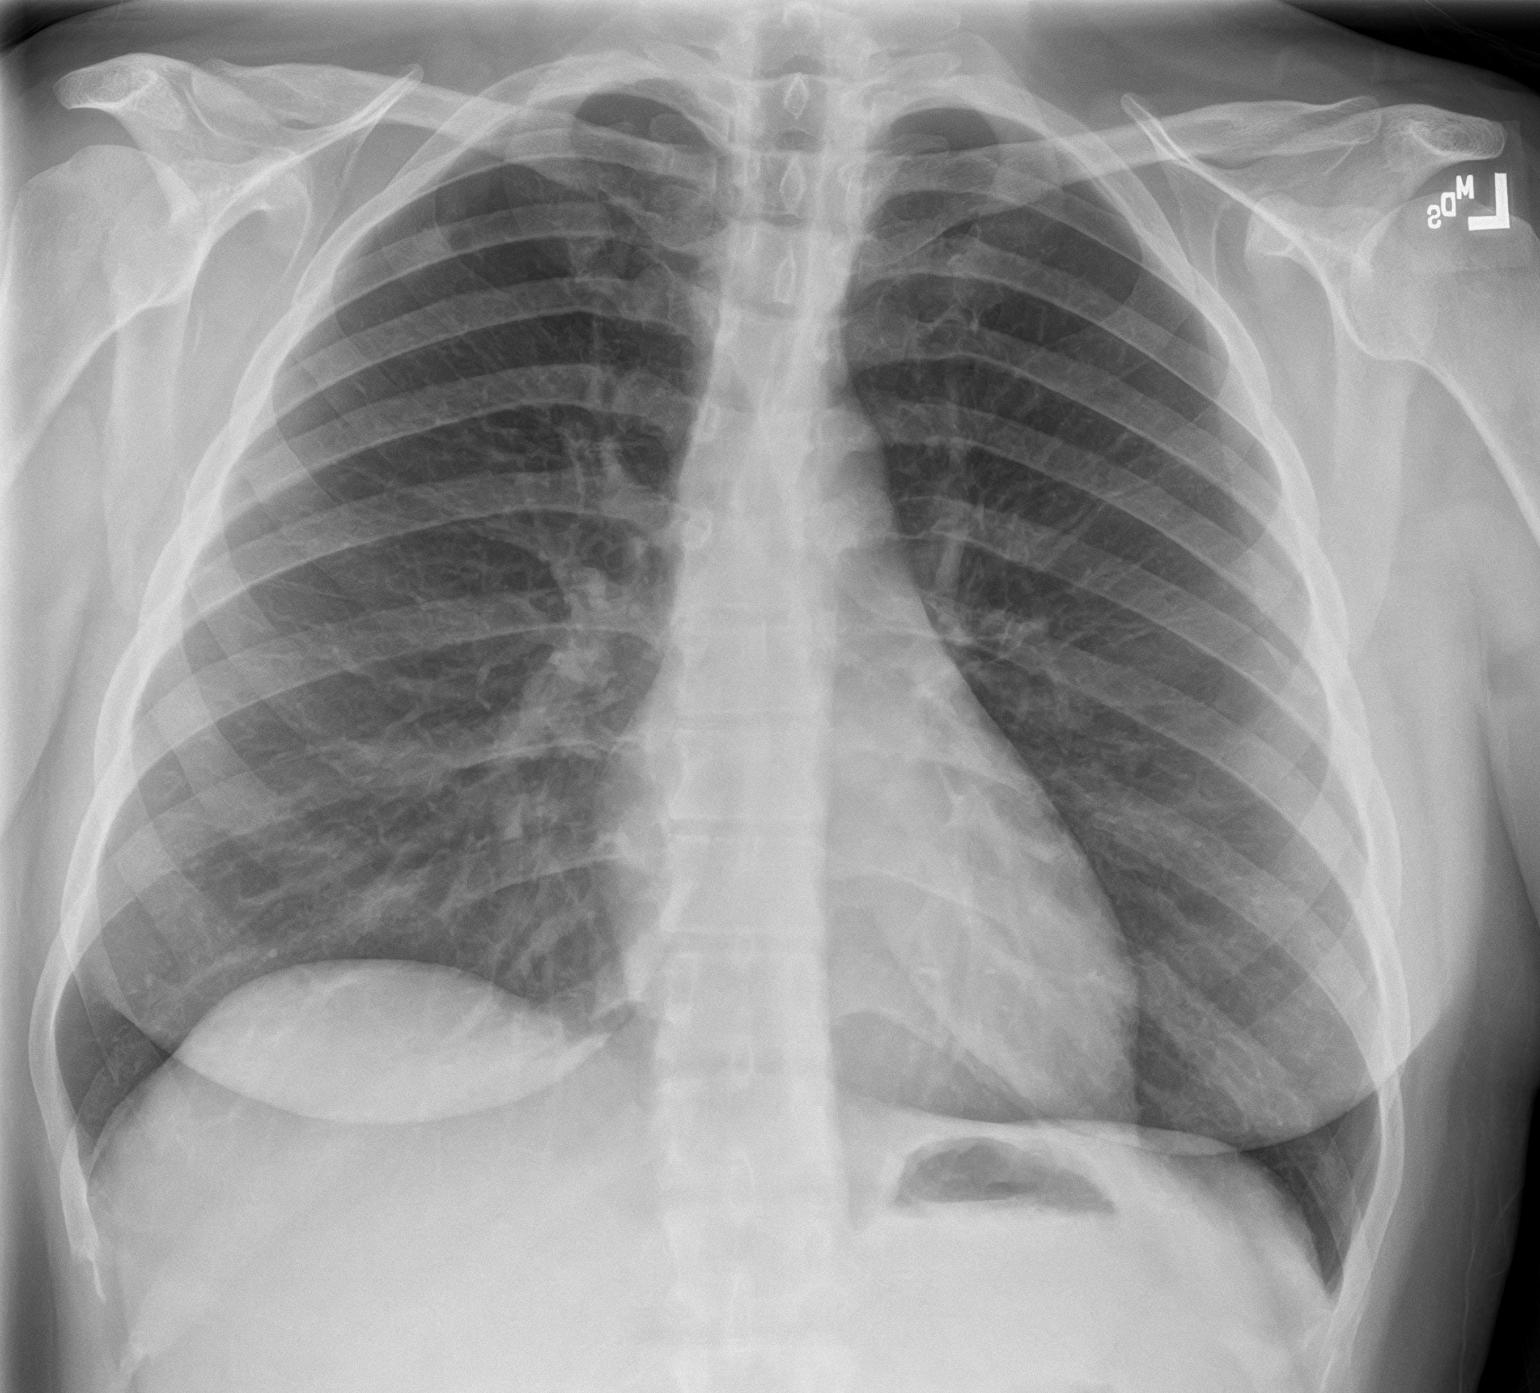
[im 2/2]
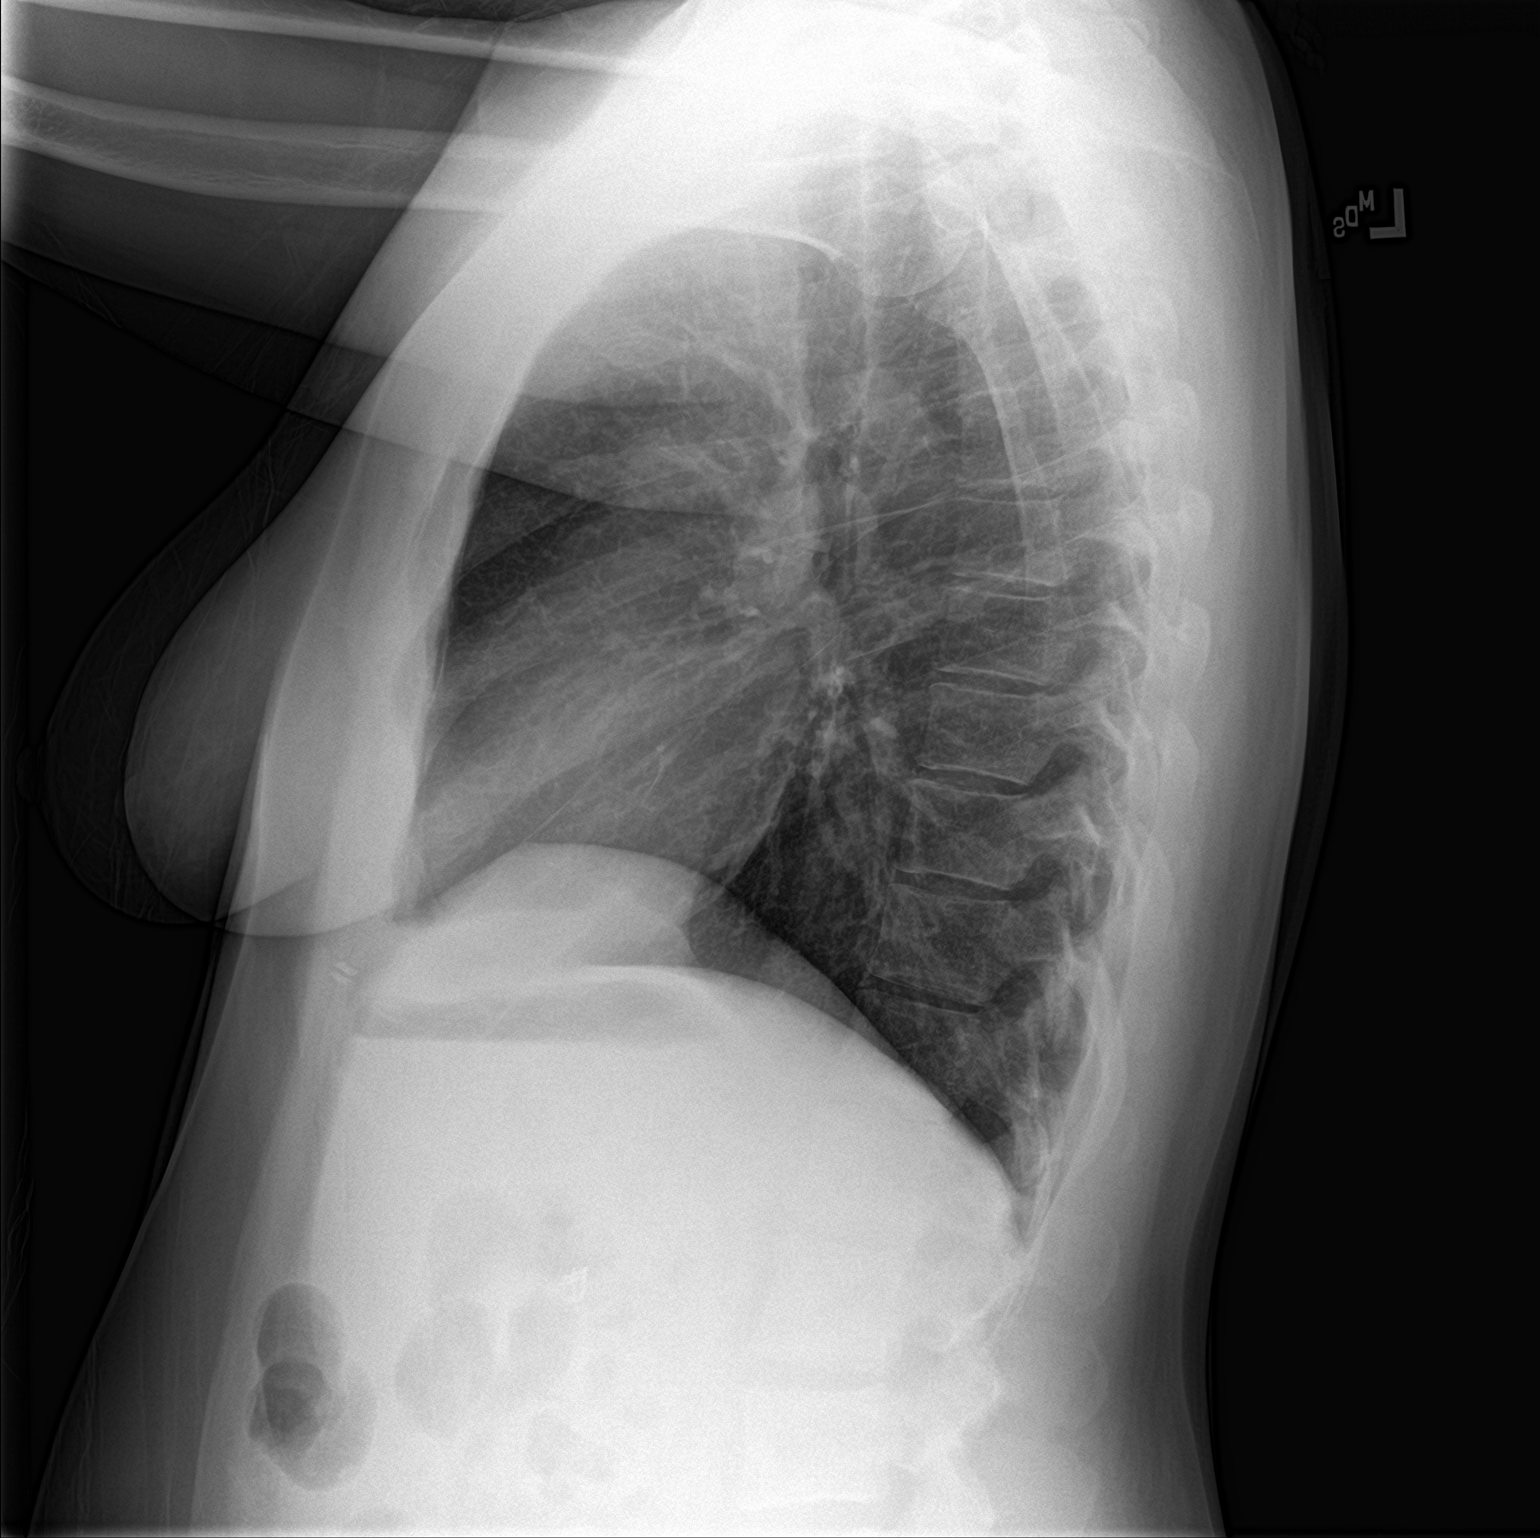

[2 of 2 positions shown; findings below may reference images not displayed]

FINDINGS: The heart size and mediastinal contours are within normal limits.
Both lungs are clear. The visualized skeletal structures are
unremarkable.
IMPRESSION: No active cardiopulmonary disease.

## 2021-01-23 ENCOUNTER — Emergency Department
Admission: EM | Admit: 2021-01-23 | Discharge: 2021-01-23 | Disposition: A | Discharge status: Home / Self Care | Payer: Self-pay | Attending: Emergency Medicine | Admitting: Emergency Medicine

## 2021-01-23 ENCOUNTER — Encounter: Payer: Self-pay | Admitting: Emergency Medicine

## 2021-01-23 ENCOUNTER — Other Ambulatory Visit: Payer: Self-pay

## 2021-01-23 DIAGNOSIS — F17210 Nicotine dependence, cigarettes, uncomplicated: Secondary | ICD-10-CM | POA: Insufficient documentation

## 2021-01-23 DIAGNOSIS — L03012 Cellulitis of left finger: Secondary | ICD-10-CM

## 2021-01-23 MED ORDER — SULFAMETHOXAZOLE-TRIMETHOPRIM 800-160 MG PO TABS: 1.0000 | ORAL_TABLET | Freq: Two times a day (BID) | ORAL | 0 refills | Status: AC

## 2021-01-23 MED ORDER — SULFAMETHOXAZOLE-TRIMETHOPRIM 800-160 MG PO TABS
1.0000 | ORAL_TABLET | Freq: Once | ORAL | Status: AC
  Administered 2021-01-23: 1 via ORAL
  Filled 2021-01-23: qty 1

## 2021-01-23 NOTE — Discharge Instructions (Addendum)
Please take the antibiotics as prescribed.  Return to the emergency department if you notice the spread of the redness, new drainage or increasing pain after starting the antibiotic.  You may use Tylenol and ibuprofen for your pain in your finger.

## 2021-01-23 NOTE — ED Triage Notes (Signed)
Pt to ED from home c/o left 3rd finger wound.  States got tattoo on finger Thursday, now has wound over area.  No obvious swelling or redness. Finger painful.

## 2021-01-24 NOTE — ED Provider Notes (Signed)
[facility] Emergency Department Provider Note  ____________________________________________   Event Date/Time   First MD Initiated Contact with Patient 01/23/21 2238     (approximate)  I have reviewed the triage vital signs and the nursing notes.   HISTORY  Chief Complaint Hand Pain  HPI Faith Ellis is a 35 y.o. female who reports to the emergency department for evaluation of left third digit.  She states that on Thursday she got a tattoo and noticed over the last 1 to 2 days that she has had increasing pain in the area and noted some flaking of the skin and redness beginning surrounded today.  She reports that she has never had this with any of her prior tattoos.  She denies any fevers or other systemic symptoms.  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  She has been trying to cover it with a Band-Aid, and states that she washes her hands several times per day.        Past Medical History:  Diagnosis Date  . Anemia   . Chronic headaches   . Gastric ulcer     Patient Active Problem List   Diagnosis Date Noted  . Anemia 01/29/2016    Past Surgical History:  Procedure Laterality Date  . c-section    . CHOLECYSTECTOMY    . ESOPHAGOGASTRODUODENOSCOPY N/A 02/01/2016   Procedure: ESOPHAGOGASTRODUODENOSCOPY (EGD);  Surgeon: [name], MD;  Location: [facility] ENDOSCOPY;  Service: Endoscopy;  Laterality: N/A;  . ESOPHAGOGASTRODUODENOSCOPY (EGD) WITH PROPOFOL N/A 04/25/2016   Procedure: ESOPHAGOGASTRODUODENOSCOPY (EGD) WITH PROPOFOL;  Surgeon: [name], MD;  Location: [facility] ENDOSCOPY;  Service: Endoscopy;  Laterality: N/A;  . TONSILLECTOMY    . TUBAL LIGATION      Prior to Admission medications   Medication Sig Start Date End Date Taking? Authorizing Provider  sulfamethoxazole-trimethoprim (BACTRIM DS) 800-160 MG tablet Take 1 tablet by mouth 2 (two) times daily for 10 days. 01/23/21 02/02/21 Yes  Elsey Holts, Farrel Gordon, PA  albuterol (VENTOLIN HFA) 108 (90 Base) MCG/ACT inhaler Inhale 2 puffs into the lungs every 6 (six) hours as needed for shortness of breath. 11/28/20   Menshew, Dannielle Karvonen, PA-C  amitriptyline (ELAVIL) 25 MG tablet Take 1 tablet (25 mg total) by mouth at bedtime. 02/02/16   [name], MD  azithromycin (ZITHROMAX Z-PAK) 250 MG tablet Take 2 tablets (500 mg) on  Day 1,  followed by 1 tablet (250 mg) once daily on Days 2 through 5. 11/28/20   Menshew, Dannielle Karvonen, PA-C  dicyclomine (BENTYL) 10 MG capsule Take 1 capsule (10 mg total) by mouth every 6 (six) hours as needed for up to 5 days for spasms (abdominal pain). 03/21/20 03/26/20  Gregor Hams, MD  Ferrous Sulfate (IRON) 325 (65 Fe) MG TABS Take 1 tablet (325 mg total) by mouth daily. 03/21/20 03/21/21  Gregor Hams, MD  pantoprazole (PROTONIX) 40 MG tablet Take 1 tablet (40 mg total) by mouth 2 (two) times daily before a meal. 02/02/16   [name], MD  predniSONE (STERAPRED UNI-PAK 21 TAB) 10 MG (21) TBPK tablet 6-day taper as directed. 11/28/20   Menshew, Dannielle Karvonen, PA-C    Allergies Keflex [cephalexin]  Family History  Family history unknown: Yes    Social History Social History   Tobacco Use  . Smoking status: Current Every Day Smoker    Packs/day: 1.00    Types: Cigarettes  . Smokeless tobacco:  Never Used  Vaping Use  . Vaping Use: Never used  Substance Use Topics  . Alcohol use: No    Alcohol/week: 0.0 standard drinks  . Drug use: No    Review of Systems Constitutional: No fever/chills Eyes: No visual changes. ENT: No sore throat. Cardiovascular: Denies chest pain. Respiratory: Denies shortness of breath. Gastrointestinal: No abdominal pain.  No nausea, no vomiting.  No diarrhea.  No constipation. Genitourinary: Negative for dysuria. Musculoskeletal:+ Left third digit pain, negative for back pain. Skin: Negative for rash. Neurological: Negative for headaches, focal weakness or  numbness.   ____________________________________________   PHYSICAL EXAM:  VITAL SIGNS: ED Triage Vitals  Enc Vitals Group     BP 01/23/21 2221 134/86     Pulse Rate 01/23/21 2221 95     Resp 01/23/21 2221 16     Temp 01/23/21 2221 98.8 F (37.1 C)     Temp Source 01/23/21 2221 Oral     SpO2 01/23/21 2221 100 %     Weight 01/23/21 2222 180 lb (81.6 kg)     Height 01/23/21 2222 5\' 2"  (1.575 m)     Head Circumference --      Peak Flow --      Pain Score 01/23/21 2222 4     Pain Loc --      Pain Edu? --      Excl. in [place]? --    Constitutional: Alert and oriented. Well appearing and in no acute distress. Eyes: Conjunctivae are normal. PERRL. EOMI. Head: Atraumatic. Nose: No congestion/rhinnorhea. Mouth/Throat: Mucous membranes are moist.  Oropharynx non-erythematous. Neck: No stridor.   Cardiovascular: Normal rate, regular rhythm. Grossly normal heart sounds.  Good peripheral circulation. Respiratory: Normal respiratory effort.  No retractions. Lungs CTAB. Musculoskeletal: There is tenderness at the site of a type II over the middle phalanx of the left third digit.  The center of the tattoo has some yellow crusting.  There is very mild erythema noted around the rim of the tattoo.  The erythema does not extend across the PIP joint line, does not extend distally past the DIP.  Patient is able to flex and extend the digit actively on her own but with mild increase in pain.  Capillary refill less than 3 seconds, radial pulse 2+.  Tattoo that she got on the right third digit at the same time is well-healed with no evidence of infection. Neurologic:  Normal speech and language. No gross focal neurologic deficits are appreciated. No gait instability. Skin:  Skin is warm, dry and intact except as described above.. No rash noted. Psychiatric: Mood and affect are normal. Speech and behavior are normal.  ____________________________________________   INITIAL IMPRESSION / ASSESSMENT AND  PLAN / ED COURSE  As part of my medical decision making, I reviewed the following data within the [place] notes reviewed and incorporated and Notes from prior ED visits        Patient is a 35 year old female who reports to the emergency department for evaluation of possible infection to the left third digit tattoo.  See HPI for further details.  On physical exam, she does have mild erythema and yellow crusting over the site of a new tattoo.  Does not extend across the joint line of the PIP or DIP and she maintains normal range of motion.  I do suspect that this is a early cellulitis type infection, however I did give the patient strict return precautions to come back for any worsening  due to the high risk for a tenosynovitis.  Will initiate treatment with Bactrim.  She is amenable with this plan and agrees to return for any worsening.  She stable this time for outpatient management.      ____________________________________________   FINAL CLINICAL IMPRESSION(S) / ED DIAGNOSES  Final diagnoses:  Cellulitis of finger of left hand     ED Discharge Orders         Ordered    sulfamethoxazole-trimethoprim (BACTRIM DS) 800-160 MG tablet  2 times daily        01/23/21 2251          *Please note:  Faith Ellis was evaluated in Emergency Department on 01/24/2021 for the symptoms described in the history of present illness. She was evaluated in the context of the global COVID-19 pandemic, which necessitated consideration that the patient might be at risk for infection with the SARS-CoV-2 virus that causes COVID-19. Institutional protocols and algorithms that pertain to the evaluation of patients at risk for COVID-19 are in a state of rapid change based on information released by regulatory bodies including the CDC and federal and state organizations. These policies and algorithms were followed during the patient's care in the ED.  Some ED evaluations and  interventions may be delayed as a result of limited staffing during and the pandemic.*   Note:  This document was prepared using Dragon voice recognition software and may include unintentional dictation errors.   Marlana Salvage, PA 01/24/21 0110    Lucrezia Starch, MD 01/24/21 1134

## 2021-04-01 ENCOUNTER — Emergency Department
Admission: EM | Admit: 2021-04-01 | Discharge: 2021-04-01 | Disposition: A | Discharge status: Home / Self Care | Payer: Self-pay | Attending: Emergency Medicine | Admitting: Emergency Medicine

## 2021-04-01 ENCOUNTER — Other Ambulatory Visit: Payer: Self-pay

## 2021-04-01 DIAGNOSIS — N920 Excessive and frequent menstruation with regular cycle: Secondary | ICD-10-CM | POA: Insufficient documentation

## 2021-04-01 DIAGNOSIS — N921 Excessive and frequent menstruation with irregular cycle: Secondary | ICD-10-CM

## 2021-04-01 DIAGNOSIS — F17210 Nicotine dependence, cigarettes, uncomplicated: Secondary | ICD-10-CM | POA: Insufficient documentation

## 2021-04-01 LAB — BASIC METABOLIC PANEL
Anion gap: 12 (ref 5–15)
BUN: 12 mg/dL (ref 6–20)
CO2: 21 mmol/L — ABNORMAL LOW (ref 22–32)
Calcium: 9.2 mg/dL (ref 8.9–10.3)
Chloride: 104 mmol/L (ref 98–111)
Creatinine, Ser: 0.58 mg/dL (ref 0.44–1.00)
GFR, Estimated: >60 mL/min (ref >60)
Glucose, Bld: 91 mg/dL (ref 70–99)
Potassium: 3.8 mmol/L (ref 3.5–5.1)
Sodium: 137 mmol/L (ref 135–145)

## 2021-04-01 LAB — CBC
HCT: 35.3 % — ABNORMAL LOW (ref 36.0–46.0)
Hemoglobin: 10.6 g/dL — ABNORMAL LOW (ref 12.0–15.0)
MCH: 21.8 pg — ABNORMAL LOW (ref 26.0–34.0)
MCHC: 30 g/dL (ref 30.0–36.0)
MCV: 72.5 fL — ABNORMAL LOW (ref 80.0–100.0)
Platelets: 242 10*3/uL (ref 150–400)
RBC: 4.87 MIL/uL (ref 3.87–5.11)
RDW: 19.7 % — ABNORMAL HIGH (ref 11.5–15.5)
WBC: 5.3 10*3/uL (ref 4.0–10.5)
nRBC: 0 % (ref 0.0–0.2)

## 2021-04-01 LAB — POC URINE PREG, ED: Preg Test, Ur: NEGATIVE

## 2021-04-01 MED ORDER — NORETHIN ACE-ETH ESTRAD-FE 1-20 MG-MCG PO TABS
1.0000 | ORAL_TABLET | Freq: Every day | ORAL | 2 refills | Status: DC
Start: 2021-04-01 — End: 2021-12-13

## 2021-04-01 NOTE — Discharge Instructions (Addendum)
Please take the birth control pills daily.  When you reach the last week of the pack, start a new pack.  Follow-up with primary care or gynecology.  Information for the [place] clinic has been provided.  For symptoms that change or worsen or if her bleeding becomes heavier or you are passing large clots, return to the emergency department.

## 2021-04-01 NOTE — ED Provider Notes (Signed)
[facility] Emergency Department Provider Note  ____________________________________________  Time seen: Approximately 4:06 PM  I have reviewed the triage vital signs and the nursing notes.   HISTORY  Chief Complaint Vaginal Bleeding    HPI Faith REYNOSO is a 35 y.o. female presents to the emergency department for treatment and evaluation of heavy vaginal bleeding.  She has a history of the same. She was told she has fibroids. Due to insurance, she does not currently have a PCP or gynecologist.   Past Medical History:  Diagnosis Date   Anemia    Chronic headaches    Gastric ulcer     Patient Active Problem List   Diagnosis Date Noted   Anemia 01/29/2016    Past Surgical History:  Procedure Laterality Date   c-section     CHOLECYSTECTOMY     ESOPHAGOGASTRODUODENOSCOPY N/A 02/01/2016   Procedure: ESOPHAGOGASTRODUODENOSCOPY (EGD);  Surgeon: [name], MD;  Location: [facility] ENDOSCOPY;  Service: Endoscopy;  Laterality: N/A;   ESOPHAGOGASTRODUODENOSCOPY (EGD) WITH PROPOFOL N/A 04/25/2016   Procedure: ESOPHAGOGASTRODUODENOSCOPY (EGD) WITH PROPOFOL;  Surgeon: [name], MD;  Location: [facility] ENDOSCOPY;  Service: Endoscopy;  Laterality: N/A;   TONSILLECTOMY     TUBAL LIGATION      Prior to Admission medications   Medication Sig Start Date End Date Taking? Authorizing Provider  norethindrone-ethinyl estradiol-FE (LOESTRIN FE 1/20) 1-20 MG-MCG tablet Take 1 tablet by mouth daily. 04/01/21  Yes Yzabella Crunk B, FNP  albuterol (VENTOLIN HFA) 108 (90 Base) MCG/ACT inhaler Inhale 2 puffs into the lungs every 6 (six) hours as needed for shortness of breath. 11/28/20   Menshew, Dannielle Karvonen, PA-C  amitriptyline (ELAVIL) 25 MG tablet Take 1 tablet (25 mg total) by mouth at bedtime. 02/02/16   [name], MD  azithromycin (ZITHROMAX Z-PAK) 250 MG tablet Take 2 tablets (500 mg) on  Day 1,  followed by 1 tablet (250 mg) once daily on Days 2 through 5. 11/28/20    Menshew, Dannielle Karvonen, PA-C  dicyclomine (BENTYL) 10 MG capsule Take 1 capsule (10 mg total) by mouth every 6 (six) hours as needed for up to 5 days for spasms (abdominal pain). 03/21/20 03/26/20  Gregor Hams, MD  Ferrous Sulfate (IRON) 325 (65 Fe) MG TABS Take 1 tablet (325 mg total) by mouth daily. 03/21/20 03/21/21  Gregor Hams, MD  pantoprazole (PROTONIX) 40 MG tablet Take 1 tablet (40 mg total) by mouth 2 (two) times daily before a meal. 02/02/16   [name], MD  predniSONE (STERAPRED UNI-PAK 21 TAB) 10 MG (21) TBPK tablet 6-day taper as directed. 11/28/20   Menshew, Dannielle Karvonen, PA-C    Allergies Keflex [cephalexin]  Family History  Family history unknown: Yes    Social History Social History   Tobacco Use   Smoking status: Every Day    Packs/day: 1.00    Pack years: 0.00    Types: Cigarettes   Smokeless tobacco: Never  Vaping Use   Vaping Use: Never used  Substance Use Topics   Alcohol use: No    Alcohol/week: 0.0 standard drinks   Drug use: No    Review of Systems Constitutional: Negative for fever. Respiratory: Negative for shortness of breath or cough. Gastrointestinal: negative for abdominal pain; negative for nausea , negative for vomiting. Genitourinary: negative for dysuria , negative for vaginal discharge. Positive for vaginal bleeding Musculoskeletal: negative for back pain. Skin: Negative for acute skin changes/rash/lesion. ____________________________________________   PHYSICAL EXAM:  VITAL SIGNS: ED Triage Vitals  Enc Vitals Group     BP 04/01/21 1308 134/70     Pulse Rate 04/01/21 1308 67     Resp 04/01/21 1308 16     Temp 04/01/21 1308 98.4 F (36.9 C)     Temp Source 04/01/21 1308 Oral     SpO2 04/01/21 1308 100 %     Weight 04/01/21 1306 180 lb (81.6 kg)     Height 04/01/21 1306 5' 3.5" (1.613 m)     Head Circumference --      Peak Flow --      Pain Score 04/01/21 1306 6     Pain Loc --      Pain Edu? --      Excl. in [place]? --      Constitutional: Alert and oriented. Well appearing and in no acute distress. Eyes: Conjunctivae are normal. Head: Atraumatic. Nose: No congestion/rhinnorhea. Mouth/Throat: Mucous membranes are moist. Respiratory: Normal respiratory effort.  No retractions. Gastrointestinal: Bowel sounds active x 4; Abdomen is soft without rebound or guarding. Genitourinary: Pelvic exam: not indicated. Musculoskeletal: No extremity tenderness nor edema.  Neurologic:  Normal speech and language. No gross focal neurologic deficits are appreciated. Speech is normal. No gait instability. Skin:  Skin is warm, dry and intact. No rash noted on exposed skin. Psychiatric: Mood and affect are normal. Speech and behavior are normal.  ____________________________________________   LABS (all labs ordered are listed, but only abnormal results are displayed)  Labs Reviewed  BASIC METABOLIC PANEL - Abnormal; Notable for the following components:      Result Value   CO2 21 (*)    All other components within normal limits  CBC - Abnormal; Notable for the following components:   Hemoglobin 10.6 (*)    HCT 35.3 (*)    MCV 72.5 (*)    MCH 21.8 (*)    RDW 19.7 (*)    All other components within normal limits  POC URINE PREG, ED - Normal  TYPE AND SCREEN   ____________________________________________  RADIOLOGY  Not indicated ____________________________________________  Procedures  ____________________________________________  35 year old female presents to the ER due to heavy and painful vaginal bleeding. See HPI.  Stable hemoglobin and hematocrit. Urine pregnancy test is negative. Bleeding likely from known fibroids. She has not tried OC to control pain and bleeding. She does smoke, but has never had a blood clot, no history of migraine.   We discussed risk of blood clots while taking birth control and she is aware that smoking places her at higher risk. She was strongly encouraged to stop smoking.  She was also advised that is she develops shortness of breath of idiopathic leg pain, she is to return to the ER.  INITIAL IMPRESSION / ASSESSMENT AND PLAN / ED COURSE  Pertinent labs & imaging results that were available during my care of the patient were reviewed by me and considered in my medical decision making (see chart for details).  ____________________________________________   FINAL CLINICAL IMPRESSION(S) / ED DIAGNOSES  Final diagnoses:  Metrorrhagia  Menorrhagia with regular cycle    Note:  This document was prepared using Dragon voice recognition software and may include unintentional dictation errors.    Victorino Dike, FNP 04/01/21 2346    Delman Kitten, MD 04/02/21 0021

## 2021-04-01 NOTE — ED Triage Notes (Signed)
Pt here with heavy vaginal bleeding. Pt states that her periods are irregular and that she has a hx of anemia. Pt states that she just feel fatigued and bad.

## 2021-04-12 ENCOUNTER — Other Ambulatory Visit: Payer: Self-pay

## 2021-04-12 ENCOUNTER — Emergency Department: Payer: Self-pay

## 2021-04-12 DIAGNOSIS — F17210 Nicotine dependence, cigarettes, uncomplicated: Secondary | ICD-10-CM | POA: Insufficient documentation

## 2021-04-12 DIAGNOSIS — U071 COVID-19: Secondary | ICD-10-CM | POA: Insufficient documentation

## 2021-04-12 LAB — CBC
HCT: 35.9 % — ABNORMAL LOW (ref 36.0–46.0)
Hemoglobin: 10.9 g/dL — ABNORMAL LOW (ref 12.0–15.0)
MCH: 21.5 pg — ABNORMAL LOW (ref 26.0–34.0)
MCHC: 30.4 g/dL (ref 30.0–36.0)
MCV: 70.9 fL — ABNORMAL LOW (ref 80.0–100.0)
Platelets: 215 10*3/uL (ref 150–400)
RBC: 5.06 MIL/uL (ref 3.87–5.11)
RDW: 19.3 % — ABNORMAL HIGH (ref 11.5–15.5)
WBC: 6.3 10*3/uL (ref 4.0–10.5)
nRBC: 0 % (ref 0.0–0.2)

## 2021-04-12 LAB — BASIC METABOLIC PANEL
Anion gap: 8 (ref 5–15)
BUN: 8 mg/dL (ref 6–20)
CO2: 23 mmol/L (ref 22–32)
Calcium: 9.3 mg/dL (ref 8.9–10.3)
Chloride: 108 mmol/L (ref 98–111)
Creatinine, Ser: 0.61 mg/dL (ref 0.44–1.00)
GFR, Estimated: >60 mL/min (ref >60)
Glucose, Bld: 105 mg/dL — ABNORMAL HIGH (ref 70–99)
Potassium: 3.7 mmol/L (ref 3.5–5.1)
Sodium: 139 mmol/L (ref 135–145)

## 2021-04-12 LAB — POC URINE PREG, ED: Preg Test, Ur: NEGATIVE

## 2021-04-12 LAB — TROPONIN I (HIGH SENSITIVITY): Troponin I (High Sensitivity): <2 ng/L (ref <18)

## 2021-04-12 NOTE — ED Triage Notes (Signed)
Pt arrives POV with CC of shortness of breath for 2-3 days. Denies cough but reports pain with deep inspiration. COVID + on 04/07/2021.

## 2021-04-13 ENCOUNTER — Emergency Department
Admission: EM | Admit: 2021-04-13 | Discharge: 2021-04-13 | Disposition: A | Discharge status: Home / Self Care | Payer: Self-pay | Attending: Emergency Medicine | Admitting: Emergency Medicine

## 2021-04-13 DIAGNOSIS — R0789 Other chest pain: Secondary | ICD-10-CM

## 2021-04-13 DIAGNOSIS — R0781 Pleurodynia: Secondary | ICD-10-CM

## 2021-04-13 DIAGNOSIS — U071 COVID-19: Secondary | ICD-10-CM

## 2021-04-13 MED ORDER — PAXLOVID 10 X 150 MG & 10 X 100MG PO TBPK: 2.0000 | ORAL_TABLET | Freq: Two times a day (BID) | ORAL | 0 refills | Status: DC

## 2021-04-13 MED ORDER — PAXLOVID 10 X 150 MG & 10 X 100MG PO TBPK: 2.0000 | ORAL_TABLET | Freq: Two times a day (BID) | ORAL | 0 refills | Status: AC

## 2021-04-13 NOTE — ED Provider Notes (Signed)
[facility] Emergency Department Provider Note   ____________________________________________   Event Date/Time   First MD Initiated Contact with Patient 04/13/21 0158     (approximate)  I have reviewed the triage vital signs and the nursing notes.   HISTORY  Chief Complaint Shortness of Breath    HPI Faith Ellis is a 35 y.o. female who presents complaining of chest wall pain  LOCATION: Lower anterior chest wall DURATION: 5 days TIMING: Worsening since onset SEVERITY: 4/10 QUALITY: Aching CONTEXT: Patient states that she has had worsening bilateral lower chest pain since being diagnosed with COVID 5 days ago MODIFYING FACTORS: [employer] SYMPTOMS: Fever, chills, musculoskeletal pain   Per medical record review patient has no contributory past medical history          Past Medical History:  Diagnosis Date   Anemia    Chronic headaches    Gastric ulcer     Patient Active Problem List   Diagnosis Date Noted   Anemia 01/29/2016    Past Surgical History:  Procedure Laterality Date   c-section     CHOLECYSTECTOMY     ESOPHAGOGASTRODUODENOSCOPY N/A 02/01/2016   Procedure: ESOPHAGOGASTRODUODENOSCOPY (EGD);  Surgeon: [name], MD;  Location: [facility] ENDOSCOPY;  Service: Endoscopy;  Laterality: N/A;   ESOPHAGOGASTRODUODENOSCOPY (EGD) WITH PROPOFOL N/A 04/25/2016   Procedure: ESOPHAGOGASTRODUODENOSCOPY (EGD) WITH PROPOFOL;  Surgeon: [name], MD;  Location: [facility] ENDOSCOPY;  Service: Endoscopy;  Laterality: N/A;   TONSILLECTOMY     TUBAL LIGATION      Prior to Admission medications   Medication Sig Start Date End Date Taking? Authorizing Provider  albuterol (VENTOLIN HFA) 108 (90 Base) MCG/ACT inhaler Inhale 2 puffs into the lungs every 6 (six) hours as needed for shortness of breath. 11/28/20   Menshew, Dannielle Karvonen, PA-C  amitriptyline (ELAVIL) 25 MG tablet Take 1 tablet (25 mg total) by mouth at  bedtime. 02/02/16   [name], MD  azithromycin (ZITHROMAX Z-PAK) 250 MG tablet Take 2 tablets (500 mg) on  Day 1,  followed by 1 tablet (250 mg) once daily on Days 2 through 5. 11/28/20   Menshew, Dannielle Karvonen, PA-C  dicyclomine (BENTYL) 10 MG capsule Take 1 capsule (10 mg total) by mouth every 6 (six) hours as needed for up to 5 days for spasms (abdominal pain). 03/21/20 03/26/20  Gregor Hams, MD  Ferrous Sulfate (IRON) 325 (65 Fe) MG TABS Take 1 tablet (325 mg total) by mouth daily. 03/21/20 03/21/21  Gregor Hams, MD  nirmatrelvir/ritonavir EUA, renal dosing, (PAXLOVID) TBPK Take 2 tablets by mouth 2 (two) times daily for 5 days. Patient GFR is >60. Take nirmatrelvir (150 mg) one tablet twice daily for 5 days and ritonavir (100 mg) one tablet twice daily for 5 days. 04/13/21 04/18/21  Naaman Plummer, MD  norethindrone-ethinyl estradiol-FE (LOESTRIN FE 1/20) 1-20 MG-MCG tablet Take 1 tablet by mouth daily. 04/01/21   Triplett, Johnette Abraham B, FNP  pantoprazole (PROTONIX) 40 MG tablet Take 1 tablet (40 mg total) by mouth 2 (two) times daily before a meal. 02/02/16   [name], MD  predniSONE (STERAPRED UNI-PAK 21 TAB) 10 MG (21) TBPK tablet 6-day taper as directed. 11/28/20   Menshew, Dannielle Karvonen, PA-C    Allergies Keflex [cephalexin]  Family History  Family history unknown: Yes    Social History Social History   Tobacco Use   Smoking status: Every Day    Packs/day: 1.50  Types: Cigarettes   Smokeless tobacco: Never  Vaping Use   Vaping Use: Never used  Substance Use Topics   Alcohol use: No    Alcohol/week: 0.0 standard drinks   Drug use: No    Review of Systems Constitutional: Endorses fever/chills Eyes: No visual changes. ENT: No sore throat. Cardiovascular: Endorses chest pain. Respiratory: Denies shortness of breath. Gastrointestinal: No abdominal pain.  No nausea, no vomiting.  No diarrhea. Genitourinary: Negative for dysuria. Musculoskeletal: Negative for acute  arthralgias Skin: Negative for rash. Neurological: Negative for headaches, weakness/numbness/paresthesias in any extremity Psychiatric: Negative for suicidal ideation/homicidal ideation   ____________________________________________   PHYSICAL EXAM:  VITAL SIGNS: ED Triage Vitals  Enc Vitals Group     BP 04/12/21 1716 126/68     Pulse Rate 04/12/21 1716 71     Resp 04/12/21 1716 17     Temp 04/12/21 1716 98.8 F (37.1 C)     Temp Source 04/12/21 1716 Oral     SpO2 04/12/21 1716 100 %     Weight 04/12/21 1718 180 lb (81.6 kg)     Height 04/12/21 1718 5\' 3"  (1.6 m)     Head Circumference --      Peak Flow --      Pain Score 04/12/21 1717 4     Pain Loc --      Pain Edu? --      Excl. in [place]? --    Constitutional: Alert and oriented. Well appearing and in no acute distress. Eyes: Conjunctivae are normal. PERRL. Head: Atraumatic. Nose: No congestion/rhinnorhea. Mouth/Throat: Mucous membranes are moist. Neck: No stridor Cardiovascular: Grossly normal heart sounds.  Good peripheral circulation. Respiratory: Normal respiratory effort.  No retractions. Gastrointestinal: Soft and nontender. No distention. Musculoskeletal: No obvious deformities Neurologic:  Normal speech and language. No gross focal neurologic deficits are appreciated. Skin:  Skin is warm and dry. No rash noted. Psychiatric: Mood and affect are normal. Speech and behavior are normal.  ____________________________________________   LABS (all labs ordered are listed, but only abnormal results are displayed)  Labs Reviewed  BASIC METABOLIC PANEL - Abnormal; Notable for the following components:      Result Value   Glucose, Bld 105 (*)    All other components within normal limits  CBC - Abnormal; Notable for the following components:   Hemoglobin 10.9 (*)    HCT 35.9 (*)    MCV 70.9 (*)    MCH 21.5 (*)    RDW 19.3 (*)    All other components within normal limits  POC URINE PREG, ED  TROPONIN I (HIGH  SENSITIVITY)   ____________________________________________  EKG  ED ECG REPORT I, Naaman Plummer, the attending physician, personally viewed and interpreted this ECG.  Date: 04/13/2021 EKG Time: 1718 Rate: 79 Rhythm: normal sinus rhythm QRS Axis: normal Intervals: normal ST/T Wave abnormalities: normal Narrative Interpretation: no evidence of acute ischemia  ____________________________________________  RADIOLOGY  ED MD interpretation: 2 view chest x-ray shows no evidence of acute abnormalities including no pneumonia, pneumothorax, or widened mediastinum  Official radiology report(s): DG Chest 2 View  Result Date: 04/12/2021 CLINICAL DATA:  Shortness of breath. EXAM: CHEST - 2 VIEW COMPARISON:  November 28, 2020. FINDINGS: The heart size and mediastinal contours are within normal limits. Both lungs are clear. No visible pleural effusions or pneumothorax. No acute osseous abnormality. IMPRESSION: No active cardiopulmonary disease. Electronically Signed   By: Margaretha Sheffield MD   On: 04/12/2021 18:22    ____________________________________________  PROCEDURES  Procedure(s) performed (including Critical Care):  .1-3 Lead EKG Interpretation  Date/Time: 04/13/2021 4:34 AM Performed by: Naaman Plummer, MD Authorized by: Naaman Plummer, MD     Interpretation: normal     ECG rate:  67   ECG rate assessment: normal     Rhythm: sinus rhythm     Ectopy: none     Conduction: normal     ____________________________________________   INITIAL IMPRESSION / ASSESSMENT AND PLAN / ED COURSE  As part of my medical decision making, I reviewed the following data within the electronic medical record, if available:  Nursing notes reviewed and incorporated, Labs reviewed, EKG interpreted, Old chart reviewed, Radiograph reviewed and Notes from prior ED visits reviewed and incorporated        Presentation most consistent with Viral Syndrome.  Patient has tested positive for  COVID-19. Based on vitals and exam they are nontoxic and stable for discharge. Patient's chest wall pain is likely related to inflammatory effects of coronavirus infection and/or musculoskeletal pain due to upper respiratory infection with cough Given History and Exam I have a lower suspicion for: Emergent CardioPulmonary causes [such as Acute Asthma or COPD Exacerbation, acute Heart Failure or exacerbation, PE, PTX, atypical ACS, PNA]. Emergent Otolaryngeal causes [such as PTA, RPA, Ludwigs, Epiglottitis, EBV].  Regarding Emergent Travel or Immunosuppressive related infectious: I have a low suspicion for acute HIV.  Will provide strict return precautions and instructions on self-isolation/quarantine and anticipatory guidance.      ____________________________________________   FINAL CLINICAL IMPRESSION(S) / ED DIAGNOSES  Final diagnoses:  COVID-19 virus infection  Atypical chest pain  Pleuritic pain     ED Discharge Orders          Ordered    nirmatrelvir/ritonavir EUA, renal dosing, (PAXLOVID) TBPK  2 times daily,   Status:  Discontinued        04/13/21 0226    nirmatrelvir/ritonavir EUA, renal dosing, (PAXLOVID) TBPK  2 times daily        04/13/21 0227             Note:  This document was prepared using Dragon voice recognition software and may include unintentional dictation errors.    Naaman Plummer, MD 04/13/21 [phone]

## 2021-06-24 ENCOUNTER — Emergency Department
Admission: EM | Admit: 2021-06-24 | Discharge: 2021-06-24 | Disposition: A | Discharge status: Home / Self Care | Payer: Medicaid Other | Attending: Emergency Medicine | Admitting: Emergency Medicine

## 2021-06-24 ENCOUNTER — Encounter: Payer: Self-pay | Admitting: Emergency Medicine

## 2021-06-24 ENCOUNTER — Other Ambulatory Visit: Payer: Self-pay

## 2021-06-24 ENCOUNTER — Emergency Department: Payer: Medicaid Other

## 2021-06-24 DIAGNOSIS — S8391XA Sprain of unspecified site of right knee, initial encounter: Secondary | ICD-10-CM | POA: Insufficient documentation

## 2021-06-24 DIAGNOSIS — M25461 Effusion, right knee: Secondary | ICD-10-CM

## 2021-06-24 DIAGNOSIS — F17210 Nicotine dependence, cigarettes, uncomplicated: Secondary | ICD-10-CM | POA: Insufficient documentation

## 2021-06-24 DIAGNOSIS — S86911A Strain of unspecified muscle(s) and tendon(s) at lower leg level, right leg, initial encounter: Secondary | ICD-10-CM

## 2021-06-24 DIAGNOSIS — X58XXXA Exposure to other specified factors, initial encounter: Secondary | ICD-10-CM | POA: Insufficient documentation

## 2021-06-24 DIAGNOSIS — M25561 Pain in right knee: Secondary | ICD-10-CM | POA: Insufficient documentation

## 2021-06-24 MED ORDER — IBUPROFEN 800 MG PO TABS: 800.0000 mg | ORAL_TABLET | Freq: Three times a day (TID) | ORAL | 0 refills | Status: DC | PRN

## 2021-06-24 NOTE — Discharge Instructions (Addendum)
Your exam and labs are negative for any acute fracture or dislocation.  You have a knee sprain with a mild effusion.  Continue to rest, ice, elevate the knee as needed.  Take over-the-counter ibuprofen or Motrin as needed for pain.  Return to your provider or Ortho for continued symptoms.

## 2021-06-24 NOTE — ED Triage Notes (Signed)
Pt reports that noticed that when she was sitting on the porch Friday she went to stand up and felt some pain twice but it went away. This morning when she went to get out of bed she has had a stabbing sensation. She states that below her right knee it feels "squishy" and it hurts more on the inside on her knee that hurts. No known injury. No swelling seen.

## 2021-06-24 NOTE — ED Provider Notes (Signed)
Emergency Medicine Provider Triage Evaluation Note  Faith Ellis , a 35 y.o. female  was evaluated in triage.  Pt complains of acute right knee pain without provocation or injury.  Patient reports pain to the anterior medial aspect of the kneecap patient denies any history of chronic ongoing knee pains..  Review of Systems  Positive: Right knee pain Negative: Leg/calf swelling  Physical Exam  BP 124/73 (BP Location: Right Arm)   Pulse 91   Temp 98.7 F (37.1 C) (Oral)   Resp 20   Ht 5\' 3"  (1.6 m)   Wt 77.1 kg   SpO2 100%   BMI 30.11 kg/m  Gen:   Awake, no distress  NAD Resp:  Normal effort CTA MSK:   Moves extremities without difficulty Medial Right knee pain Other:  CVS: RRR  Medical Decision Making  Medically screening exam initiated at 6:43 PM.  Appropriate orders placed.  [name] was informed that the remainder of the evaluation will be completed by another provider, this initial triage assessment does not replace that evaluation, and the importance of remaining in the ED until their evaluation is complete.  Patient ED evaluation of right knee pain.   Melvenia Needles, PA-C 06/24/21 1845    Naaman Plummer, MD 06/24/21 2129

## 2021-06-24 NOTE — ED Notes (Signed)
PA to triage to evaluate and d/c pt

## 2021-06-24 NOTE — ED Provider Notes (Signed)
[facility] Emergency Department Provider Note ____________________________________________  Time seen: 1944  I have reviewed the triage vital signs and the nursing notes.  HISTORY  Chief Complaint  Knee Pain  HPI Faith Ellis is a 35 y.o. female presents to the ED when she noted some pain to the medial aspect of her right knee she stood from sitting on the porch on Friday.  She notes intermittent deposition to the knee, and notes that her knee feels "squishy.".  She denies any recent injury, trauma, fall patient also denies any catch, click, lock, or give way.  Past Medical History:  Diagnosis Date   Anemia    Chronic headaches    Gastric ulcer     Patient Active Problem List   Diagnosis Date Noted   Anemia 01/29/2016    Past Surgical History:  Procedure Laterality Date   c-section     CHOLECYSTECTOMY     ESOPHAGOGASTRODUODENOSCOPY N/A 02/01/2016   Procedure: ESOPHAGOGASTRODUODENOSCOPY (EGD);  Surgeon: [name], MD;  Location: [facility] ENDOSCOPY;  Service: Endoscopy;  Laterality: N/A;   ESOPHAGOGASTRODUODENOSCOPY (EGD) WITH PROPOFOL N/A 04/25/2016   Procedure: ESOPHAGOGASTRODUODENOSCOPY (EGD) WITH PROPOFOL;  Surgeon: [name], MD;  Location: [facility] ENDOSCOPY;  Service: Endoscopy;  Laterality: N/A;   TONSILLECTOMY     TUBAL LIGATION      Prior to Admission medications   Medication Sig Start Date End Date Taking? Authorizing Provider  ibuprofen (ADVIL) 800 MG tablet Take 1 tablet (800 mg total) by mouth every 8 (eight) hours as needed. 06/24/21  Yes Tachina Spoonemore, Dannielle Karvonen, PA-C  albuterol (VENTOLIN HFA) 108 (90 Base) MCG/ACT inhaler Inhale 2 puffs into the lungs every 6 (six) hours as needed for shortness of breath. 11/28/20   Zayley Arras, Dannielle Karvonen, PA-C  amitriptyline (ELAVIL) 25 MG tablet Take 1 tablet (25 mg total) by mouth at bedtime. 02/02/16   [name], MD  azithromycin (ZITHROMAX Z-PAK) 250 MG tablet Take 2 tablets (500 mg) on  Day  1,  followed by 1 tablet (250 mg) once daily on Days 2 through 5. 11/28/20   Avontae Burkhead, Dannielle Karvonen, PA-C  dicyclomine (BENTYL) 10 MG capsule Take 1 capsule (10 mg total) by mouth every 6 (six) hours as needed for up to 5 days for spasms (abdominal pain). 03/21/20 03/26/20  Gregor Hams, MD  Ferrous Sulfate (IRON) 325 (65 Fe) MG TABS Take 1 tablet (325 mg total) by mouth daily. 03/21/20 03/21/21  Gregor Hams, MD  norethindrone-ethinyl estradiol-FE (LOESTRIN FE 1/20) 1-20 MG-MCG tablet Take 1 tablet by mouth daily. 04/01/21   Triplett, Johnette Abraham B, FNP  pantoprazole (PROTONIX) 40 MG tablet Take 1 tablet (40 mg total) by mouth 2 (two) times daily before a meal. 02/02/16   [name], MD  predniSONE (STERAPRED UNI-PAK 21 TAB) 10 MG (21) TBPK tablet 6-day taper as directed. 11/28/20   Lion Fernandez, Dannielle Karvonen, PA-C    Allergies Keflex [cephalexin]  Family History  Family history unknown: Yes    Social History Social History   Tobacco Use   Smoking status: Every Day    Packs/day: 1.50    Types: Cigarettes   Smokeless tobacco: Never  Vaping Use   Vaping Use: Never used  Substance Use Topics   Alcohol use: No    Alcohol/week: 0.0 standard drinks   Drug use: No    Review of Systems  Constitutional: Negative for fever. Cardiovascular: Negative for chest pain. Respiratory: Negative for shortness of breath. Gastrointestinal: Negative  for abdominal pain, vomiting and diarrhea. Genitourinary: Negative for dysuria. Musculoskeletal: Negative for back pain.  Right knee pain as above. Skin: Negative for rash. Neurological: Negative for headaches, focal weakness or numbness. ____________________________________________  PHYSICAL EXAM:  VITAL SIGNS: ED Triage Vitals [06/24/21 1836]  Enc Vitals Group     BP 124/73     Pulse Rate 91     Resp 20     Temp 98.7 F (37.1 C)     Temp Source Oral     SpO2 100 %     Weight 170 lb (77.1 kg)     Height 5\' 3"  (1.6 m)     Head Circumference       Peak Flow      Pain Score 6     Pain Loc      Pain Edu?      Excl. in [place]?     Constitutional: Alert and oriented. Well appearing and in no distress. Head: Normocephalic and atraumatic. Eyes: Conjunctivae are normal.  Normal extraocular movements Cardiovascular: Normal rate, regular rhythm. Normal distal pulses. Respiratory: Normal respiratory effort. No wheezes/rales/rhonchi. Gastrointestinal: Soft and nontender. No distention. Musculoskeletal: Right knee without obvious deformity or dislocation.  Patient with some mild joint effusion noted to the inferior medial aspect of the right knee.  Normal flexion extension range of the knee.  No valgus or varus joint stress is elicited.  No popliteal space fullness is noted.  No calf or Achilles tenderness noted distally.  Nontender with normal range of motion in all other extremities.  Neurologic:  Normal gait without ataxia. Normal speech and language. No gross focal neurologic deficits are appreciated. Skin:  Skin is warm, dry and intact. No rash noted. Psychiatric: Mood and affect are normal. Patient exhibits appropriate insight and judgment. ____________________________________________    {LABS (pertinent positives/negatives)  ____________________________________________  {EKG  ____________________________________________   RADIOLOGY Official radiology report(s): DG Knee Complete 4 Views Right  Result Date: 06/24/2021 CLINICAL DATA:  Right knee pain EXAM: RIGHT KNEE - COMPLETE 4+ VIEW COMPARISON:  None. FINDINGS: No evidence of fracture, dislocation, or joint effusion. No evidence of arthropathy or other focal bone abnormality. Soft tissues are unremarkable. IMPRESSION: Negative. Electronically Signed   By: Fidela Salisbury M.D.   On: 06/24/2021 19:38   ____________________________________________  PROCEDURES  Ace bandage  Procedures ____________________________________________   INITIAL IMPRESSION / ASSESSMENT AND PLAN / ED  COURSE  As part of my medical decision making, I reviewed the following data within the [place] reviewed WNL and Notes from prior ED visits   Patient ED evaluation of medial right knee pain without preceding injury or trauma.  She presents with a reassuring exam without signs of internal derangement.  No radiologic evidence of any acute fracture or dislocation.  Patient is discharged with an Ace bandage for support of her right knee strain.  She will follow-up with primary provider or Ortho for ongoing symptoms.  Return cautions have been reviewed.  TREMEKA HELBLING was evaluated in Emergency Department on 06/25/2021 for the symptoms described in the history of present illness. She was evaluated in the context of the global COVID-19 pandemic, which necessitated consideration that the patient might be at risk for infection with the SARS-CoV-2 virus that causes COVID-19. Institutional protocols and algorithms that pertain to the evaluation of patients at risk for COVID-19 are in a state of rapid change based on information released by regulatory bodies including the CDC and federal and state organizations. These  policies and algorithms were followed during the patient's care in the ED. ___________________________________________  FINAL CLINICAL IMPRESSION(S) / ED DIAGNOSES  Final diagnoses:  Strain of right knee, initial encounter  Effusion of right knee      Carmie End, Dannielle Karvonen, PA-C 06/25/21 1343    Duffy Bruce, MD 06/27/21 [phone]

## 2021-08-12 ENCOUNTER — Emergency Department: Payer: Medicaid Other

## 2021-08-12 ENCOUNTER — Other Ambulatory Visit: Payer: Self-pay

## 2021-08-12 ENCOUNTER — Emergency Department
Admission: EM | Admit: 2021-08-12 | Discharge: 2021-08-12 | Disposition: A | Discharge status: Home / Self Care | Payer: Medicaid Other | Attending: Emergency Medicine | Admitting: Emergency Medicine

## 2021-08-12 DIAGNOSIS — F17210 Nicotine dependence, cigarettes, uncomplicated: Secondary | ICD-10-CM | POA: Insufficient documentation

## 2021-08-12 DIAGNOSIS — J101 Influenza due to other identified influenza virus with other respiratory manifestations: Secondary | ICD-10-CM | POA: Insufficient documentation

## 2021-08-12 DIAGNOSIS — Z20822 Contact with and (suspected) exposure to covid-19: Secondary | ICD-10-CM | POA: Insufficient documentation

## 2021-08-12 DIAGNOSIS — M545 Low back pain, unspecified: Secondary | ICD-10-CM | POA: Insufficient documentation

## 2021-08-12 LAB — URINALYSIS, ROUTINE W REFLEX MICROSCOPIC
Bacteria, UA: NONE SEEN
Bilirubin Urine: NEGATIVE
Glucose, UA: NEGATIVE mg/dL
Ketones, ur: NEGATIVE mg/dL
Leukocytes,Ua: NEGATIVE
Nitrite: NEGATIVE
Protein, ur: NEGATIVE mg/dL
Specific Gravity, Urine: 1.011 (ref 1.005–1.030)
pH: 5 (ref 5.0–8.0)

## 2021-08-12 LAB — COMPREHENSIVE METABOLIC PANEL
ALT: 31 U/L (ref 0–44)
AST: 26 U/L (ref 15–41)
Albumin: 4.4 g/dL (ref 3.5–5.0)
Alkaline Phosphatase: 103 U/L (ref 38–126)
Anion gap: 6 (ref 5–15)
BUN: 8 mg/dL (ref 6–20)
CO2: 24 mmol/L (ref 22–32)
Calcium: 9 mg/dL (ref 8.9–10.3)
Chloride: 105 mmol/L (ref 98–111)
Creatinine, Ser: 0.52 mg/dL (ref 0.44–1.00)
GFR, Estimated: >60 mL/min (ref >60)
Glucose, Bld: 95 mg/dL (ref 70–99)
Potassium: 4 mmol/L (ref 3.5–5.1)
Sodium: 135 mmol/L (ref 135–145)
Total Bilirubin: 0.5 mg/dL (ref 0.3–1.2)
Total Protein: 7.4 g/dL (ref 6.5–8.1)

## 2021-08-12 LAB — CBC
HCT: 38.2 % (ref 36.0–46.0)
Hemoglobin: 11.7 g/dL — ABNORMAL LOW (ref 12.0–15.0)
MCH: 23.3 pg — ABNORMAL LOW (ref 26.0–34.0)
MCHC: 30.6 g/dL (ref 30.0–36.0)
MCV: 76.1 fL — ABNORMAL LOW (ref 80.0–100.0)
Platelets: 230 10*3/uL (ref 150–400)
RBC: 5.02 MIL/uL (ref 3.87–5.11)
RDW: 17.7 % — ABNORMAL HIGH (ref 11.5–15.5)
WBC: 6.2 10*3/uL (ref 4.0–10.5)
nRBC: 0 % (ref 0.0–0.2)

## 2021-08-12 LAB — RESP PANEL BY RT-PCR (FLU A&B, COVID) ARPGX2
Influenza A by PCR: POSITIVE — AB
Influenza B by PCR: NEGATIVE
SARS Coronavirus 2 by RT PCR: NEGATIVE

## 2021-08-12 LAB — POC URINE PREG, ED: Preg Test, Ur: NEGATIVE

## 2021-08-12 MED ORDER — LIDOCAINE 5 % EX PTCH: 1.0000 | MEDICATED_PATCH | Freq: Two times a day (BID) | CUTANEOUS | 0 refills | Status: DC

## 2021-08-12 MED ORDER — SODIUM CHLORIDE 0.9 % IV BOLUS
1000.0000 mL | Freq: Once | INTRAVENOUS | Status: AC
  Administered 2021-08-12: 1000 mL via INTRAVENOUS

## 2021-08-12 MED ORDER — KETOROLAC TROMETHAMINE 30 MG/ML IJ SOLN
15.0000 mg | Freq: Once | INTRAMUSCULAR | Status: AC
  Administered 2021-08-12: 15 mg via INTRAVENOUS
  Filled 2021-08-12: qty 1

## 2021-08-12 MED ORDER — ALBUTEROL SULFATE HFA 108 (90 BASE) MCG/ACT IN AERS: 2.0000 | INHALATION_SPRAY | Freq: Four times a day (QID) | RESPIRATORY_TRACT | 2 refills | Status: DC | PRN

## 2021-08-12 NOTE — ED Triage Notes (Signed)
Pt come with c/o lower back pain that started today. Pt states pain all over. Pt states cough, cold symptoms.

## 2021-08-12 NOTE — ED Provider Notes (Signed)
Emergency Medicine Provider Triage Evaluation Note  Faith Ellis , a 35 y.o. female  was evaluated in triage.  Pt complains of body aches, back pain, coughing, congestion.  Patient states that she has had several days of symptoms, developed her back pain today.  No urinary changes.  No nausea, vomiting, diarrhea.  Patient has had no difficulty breathing which she did have 1 episode of hemoptysis yesterday.  None since.  She took a COVID test at home which was negative..  Review of Systems  Positive: Body aches, back pain, congestion, coughing Negative: Headache, visual changes, neck pain or stiffness, chest pain, abdominal pain, emesis, diarrhea  Physical Exam  BP (!) 148/79   Pulse 84   Temp 98 F (36.7 C)   Resp 17   SpO2 100%  Gen:   Awake, no distress   Resp:  Normal effort  MSK:   Moves extremities without difficulty.   Other:  No gross adventitious lung sounds  Medical Decision Making  Medically screening exam initiated at 5:32 PM.  Appropriate orders placed.  Faith Ellis was informed that the remainder of the evaluation will be completed by another provider, this initial triage assessment does not replace that evaluation, and the importance of remaining in the ED until their evaluation is complete.  Patient presents emergency department with body aches, back pain, coughing.  Symptoms have been ongoing times several days.  Patient will have COVID and influenza test, chest x-ray, urinalysis.   Faith Moll, PA-C 08/12/21 1732    Vladimir Crofts, MD 08/12/21 2318

## 2021-08-12 NOTE — ED Provider Notes (Signed)
-----------------------------------------   7:28 PM on 08/12/2021 -----------------------------------------  Blood pressure (!) 148/79, pulse 84, temperature 98 F (36.7 C), resp. rate 17, height 5\' 3"  (1.6 m), weight 77.1 kg, SpO2 100 %.  Assuming care from Dr. Quentin Cornwall.  In short, Faith Ellis is a 35 y.o. female with a chief complaint of Back Pain .  Refer to the original H&P for additional details.  The current plan of care is to follow-up urine pregnancy and UA for low back pain.  ----------------------------------------- 8:58 PM on 08/12/2021 ----------------------------------------- Pregnancy testing is negative and UA shows no signs of infection.  Patient's back pain is partially improved following dose of Toradol.  She is appropriate for discharge home with PCP follow-up, was counseled to return to the ED for new worsening symptoms.  Patient agrees with plan.    Blake Divine, MD 08/12/21 [phone]

## 2021-08-12 NOTE — ED Provider Notes (Signed)
[facility] Emergency Department Provider Note    Event Date/Time   First MD Initiated Contact with Patient 08/12/21 1813     (approximate)  I have reviewed the triage vital signs and the nursing notes.   HISTORY  Chief Complaint Back Pain    HPI Faith Ellis is a 35 y.o. female presents to the ER for evaluation of cough congestion myalgias low back pain.  States has been coughing up some sputum and yesterday had some blood-tinged to it.  Denies any chest pain or shortness of breath no pleuritic pain right now.  Has had some generalized malaise and feeling sluggish.  Denies any dysuria.  No diarrhea.  Past Medical History:  Diagnosis Date   Anemia    Chronic headaches    Gastric ulcer    Family History  Family history unknown: Yes   Past Surgical History:  Procedure Laterality Date   c-section     CHOLECYSTECTOMY     ESOPHAGOGASTRODUODENOSCOPY N/A 02/01/2016   Procedure: ESOPHAGOGASTRODUODENOSCOPY (EGD);  Surgeon: [name], MD;  Location[facility] ENDOSCOPY;  Service: Endoscopy;  Laterality: N/A;   ESOPHAGOGASTRODUODENOSCOPY (EGD) WITH PROPOFOL N/A 04/25/2016   Procedure: ESOPHAGOGASTRODUODENOSCOPY (EGD) WITH PROPOFOL;  Surgeon: [name], MD;  Location: [facility] ENDOSCOPY;  Service: Endoscopy;  Laterality: N/A;   TONSILLECTOMY     TUBAL LIGATION     Patient Active Problem List   Diagnosis Date Noted   Anemia 01/29/2016      Prior to Admission medications   Medication Sig Start Date End Date Taking? Authorizing Provider  albuterol (VENTOLIN HFA) 108 (90 Base) MCG/ACT inhaler Inhale 2 puffs into the lungs every 6 (six) hours as needed for shortness of breath. 08/12/21   Merlyn Lot, MD  amitriptyline (ELAVIL) 25 MG tablet Take 1 tablet (25 mg total) by mouth at bedtime. 02/02/16   [name], MD  azithromycin (ZITHROMAX Z-PAK) 250 MG tablet Take 2 tablets (500 mg) on  Day 1,  followed by 1 tablet (250 mg) once daily on Days 2  through 5. 11/28/20   Menshew, Dannielle Karvonen, PA-C  dicyclomine (BENTYL) 10 MG capsule Take 1 capsule (10 mg total) by mouth every 6 (six) hours as needed for up to 5 days for spasms (abdominal pain). 03/21/20 03/26/20  Gregor Hams, MD  Ferrous Sulfate (IRON) 325 (65 Fe) MG TABS Take 1 tablet (325 mg total) by mouth daily. 03/21/20 03/21/21  Gregor Hams, MD  ibuprofen (ADVIL) 800 MG tablet Take 1 tablet (800 mg total) by mouth every 8 (eight) hours as needed. 06/24/21   Menshew, Dannielle Karvonen, PA-C  norethindrone-ethinyl estradiol-FE (LOESTRIN FE 1/20) 1-20 MG-MCG tablet Take 1 tablet by mouth daily. 04/01/21   Triplett, Dessa Phi, FNP  pantoprazole (PROTONIX) 40 MG tablet Take 1 tablet (40 mg total) by mouth 2 (two) times daily before a meal. 02/02/16   [name], MD    Allergies Keflex [cephalexin]    Social History Social History   Tobacco Use   Smoking status: Every Day    Packs/day: 1.50    Types: Cigarettes   Smokeless tobacco: Never  Vaping Use   Vaping Use: Never used  Substance Use Topics   Alcohol use: No    Alcohol/week: 0.0 standard drinks   Drug use: No    Review of Systems Patient denies headaches, rhinorrhea, blurry vision, numbness, shortness of breath, chest pain, edema, cough, abdominal pain, nausea, vomiting, diarrhea, dysuria, fevers, rashes or hallucinations unless  otherwise stated above in HPI. ____________________________________________   PHYSICAL EXAM:  VITAL SIGNS: Vitals:   08/12/21 1729  BP: (!) 148/79  Pulse: 84  Resp: 17  Temp: 98 F (36.7 C)  SpO2: 100%    Constitutional: Alert and oriented.  Eyes: Conjunctivae are normal.  Head: Atraumatic. Nose: No congestion/rhinnorhea. Mouth/Throat: Mucous membranes are moist.   Neck: No stridor. Painless ROM.  Cardiovascular: Normal rate, regular rhythm. Grossly normal heart sounds.  Good peripheral circulation. Respiratory: Normal respiratory effort.  No retractions. Lungs  CTAB. Gastrointestinal: Soft and nontender. No distention. No abdominal bruits. No CVA tenderness. Genitourinary:  Musculoskeletal: No lower extremity tenderness nor edema.  No joint effusions. Neurologic:  Normal speech and language. No gross focal neurologic deficits are appreciated. No facial droop Skin:  Skin is warm, dry and intact. No rash noted. Psychiatric: Mood and affect are normal. Speech and behavior are normal.  ____________________________________________   LABS (all labs ordered are listed, but only abnormal results are displayed)  Results for orders placed or performed during the hospital encounter of 08/12/21 (from the past 24 hour(s))  Resp Panel by RT-PCR (Flu A&B, Covid) Nasopharyngeal Swab     Status: Abnormal   Collection Time: 08/12/21  5:30 PM   Specimen: Nasopharyngeal Swab; Nasopharyngeal(NP) swabs in vial transport medium  Result Value Ref Range   SARS Coronavirus 2 by RT PCR NEGATIVE NEGATIVE   Influenza A by PCR POSITIVE (A) NEGATIVE   Influenza B by PCR NEGATIVE NEGATIVE  CBC     Status: Abnormal   Collection Time: 08/12/21  6:57 PM  Result Value Ref Range   WBC 6.2 4.0 - 10.5 K/uL   RBC 5.02 3.87 - 5.11 MIL/uL   Hemoglobin 11.7 (L) 12.0 - 15.0 g/dL   HCT 38.2 36.0 - 46.0 %   MCV 76.1 (L) 80.0 - 100.0 fL   MCH 23.3 (L) 26.0 - 34.0 pg   MCHC 30.6 30.0 - 36.0 g/dL   RDW 17.7 (H) 11.5 - 15.5 %   Platelets 230 150 - 400 K/uL   nRBC 0.0 0.0 - 0.2 %   ____________________________________________ ____________________________________________  RADIOLOGY  I personally reviewed all radiographic images ordered to evaluate for the above acute complaints and reviewed radiology reports and findings.  These findings were personally discussed with the patient.  Please see medical record for radiology report.  ____________________________________________   PROCEDURES  Procedure(s) performed:  Procedures    Critical Care performed:  no ____________________________________________   INITIAL IMPRESSION / ASSESSMENT AND PLAN / ED COURSE  Pertinent labs & imaging results that were available during my care of the patient were reviewed by me and considered in my medical decision making (see chart for details).   DDX: covid, flu, pna, dehydration, anemia, sepsis, pyelo, uti, bronchitis, pe  Faith Ellis is a 35 y.o. who presents to the ED with symptoms presentation as described above she is afebrile hemodynamically stable in no acute distress.  Chest x-ray without infiltrate no wheezing on exam.  She is flu positive which explains majority of her symptoms.  Have a low suspicion for PE or sepsis.  Does appear under the weather however and states that she is feeling generalized malaise and has a history of anemia therefore we will check blood work give some IV fluids as well as Toradol.  Will check urine.  She is outside of the window to receive Tamiflu.    Patient reassessed.  Remains well-appearing nontoxic.  Still awaiting blood work results but her  hemoglobin is normal no white count.  Still awaiting urinalysis.  Patient will be signed out to oncoming physician.  Anticipate discharge home.  Patient agreeable plan.  The patient was evaluated in Emergency Department today for the symptoms described in the history of present illness. He/she was evaluated in the context of the global COVID-19 pandemic, which necessitated consideration that the patient might be at risk for infection with the SARS-CoV-2 virus that causes COVID-19. Institutional protocols and algorithms that pertain to the evaluation of patients at risk for COVID-19 are in a state of rapid change based on information released by regulatory bodies including the CDC and federal and state organizations. These policies and algorithms were followed during the patient's care in the ED.  As part of my medical decision making, I reviewed the following data within the  [place] notes reviewed and incorporated, Labs reviewed, notes from prior ED visits and [place] Controlled Substance Database   ____________________________________________   FINAL CLINICAL IMPRESSION(S) / ED DIAGNOSES  Final diagnoses:  Acute bilateral low back pain without sciatica  Influenza A      NEW MEDICATIONS STARTED DURING THIS VISIT:  Current Discharge Medication List       Note:  This document was prepared using Dragon voice recognition software and may include unintentional dictation errors.    Merlyn Lot, MD 08/12/21 [place]

## 2021-09-30 ENCOUNTER — Emergency Department
Admission: EM | Admit: 2021-09-30 | Discharge: 2021-09-30 | Disposition: A | Discharge status: Home / Self Care | Payer: Medicaid Other | Attending: Emergency Medicine | Admitting: Emergency Medicine

## 2021-09-30 ENCOUNTER — Other Ambulatory Visit: Payer: Self-pay

## 2021-09-30 DIAGNOSIS — H6593 Unspecified nonsuppurative otitis media, bilateral: Secondary | ICD-10-CM

## 2021-09-30 DIAGNOSIS — H748X3 Other specified disorders of middle ear and mastoid, bilateral: Secondary | ICD-10-CM | POA: Insufficient documentation

## 2021-09-30 NOTE — Discharge Instructions (Signed)
Take 10 mg of Zyrtec once daily.  Take one spray of Flonase each side.

## 2021-09-30 NOTE — ED Provider Notes (Signed)
[facility] Provider Note  Patient Contact: 9:18 PM (approximate)   History   Otalgia   HPI  Faith Ellis is a 36 y.o. female presents to the emergency department with bilateral ear pain that started 1231.  No discharge from the bilateral ears or fever.  Patient has had muffled hearing and feels a popping sensation when she swallows.      Physical Exam   Triage Vital Signs: ED Triage Vitals  Enc Vitals Group     BP 09/30/21 1904 117/67     Pulse Rate 09/30/21 1904 73     Resp 09/30/21 1904 18     Temp 09/30/21 1904 98.6 F (37 C)     Temp Source 09/30/21 1904 Oral     SpO2 09/30/21 1904 96 %     Weight 09/30/21 1905 160 lb (72.6 kg)     Height 09/30/21 1905 5\' 3"  (1.6 m)     Head Circumference --      Peak Flow --      Pain Score 09/30/21 1904 5     Pain Loc --      Pain Edu? --      Excl. in [place]? --     Most recent vital signs: Vitals:   09/30/21 1904  BP: 117/67  Pulse: 73  Resp: 18  Temp: 98.6 F (37 C)  SpO2: 96%     General: Alert and in no acute distress. Eyes:  PERRL. EOMI. ENT:      Ears: Patient has middle ear effusion bilaterally.       Nose: No congestion/rhinnorhea.      Mouth/Throat: Mucous membranes are moist. Neck: No stridor. No cervical spine tenderness to palpation. Cardiovascular:  Good peripheral perfusion Respiratory: Normal respiratory effort without tachypnea or retractions. Lungs CTAB. Good air entry to the bases with no decreased or absent breath sounds. Gastrointestinal: Bowel sounds 4 quadrants. Soft and nontender to palpation. No guarding or rigidity. No palpable masses. No distention. No CVA tenderness. Musculoskeletal: Full range of motion to all extremities.  Neurologic:  No gross focal neurologic deficits are appreciated.  Skin:   No rash noted Other:   ED Results / Procedures / Treatments   Labs (all labs ordered are listed, but only abnormal results are displayed) Labs Reviewed - No  data to display         Procedures   MEDICATIONS ORDERED IN ED: Medications - No data to display   IMPRESSION / MDM / [place] / ED COURSE  I reviewed the triage vital signs and the nursing notes.                              Differential diagnosis includes, but is not limited to, otitis media, otitis externa, middle ear effusion  Assessment and plan Middle ear effusion 36 year old female presents to the emergency department with bilateral ear pain that started on 1231.  Vital signs are reassuring at triage.  On physical exam, patient was alert, active and nontoxic-appearing.  Physical exam findings were consistent with middle ear effusion.  She was discharged with Flonase and Zyrtec.      FINAL CLINICAL IMPRESSION(S) / ED DIAGNOSES   Final diagnoses:  Fluid level behind tympanic membrane of both ears     Rx / DC Orders   ED Discharge Orders     None        Note:  This  document was prepared using Systems analyst and may include unintentional dictation errors.   Vallarie Mare Egan, Hershal Coria 09/30/21 2121    Delman Kitten, MD 09/30/21 2314

## 2021-09-30 NOTE — ED Triage Notes (Signed)
Pt presents to ER c/o left ear pain that started 12/31.  Pt states she has pain when moving head side to side.  Pt A&O x4 at this time.  Denies hearing loss.

## 2021-12-13 ENCOUNTER — Emergency Department
Admission: EM | Admit: 2021-12-13 | Discharge: 2021-12-13 | Disposition: A | Discharge status: Home / Self Care | Payer: Medicaid Other | Attending: Emergency Medicine | Admitting: Emergency Medicine

## 2021-12-13 DIAGNOSIS — D649 Anemia, unspecified: Secondary | ICD-10-CM | POA: Insufficient documentation

## 2021-12-13 DIAGNOSIS — D251 Intramural leiomyoma of uterus: Secondary | ICD-10-CM | POA: Insufficient documentation

## 2021-12-13 DIAGNOSIS — Z760 Encounter for issue of repeat prescription: Secondary | ICD-10-CM | POA: Insufficient documentation

## 2021-12-13 DIAGNOSIS — D259 Leiomyoma of uterus, unspecified: Secondary | ICD-10-CM

## 2021-12-13 LAB — BASIC METABOLIC PANEL
Anion gap: 6 (ref 5–15)
BUN: 8 mg/dL (ref 6–20)
CO2: 27 mmol/L (ref 22–32)
Calcium: 8.8 mg/dL — ABNORMAL LOW (ref 8.9–10.3)
Chloride: 106 mmol/L (ref 98–111)
Creatinine, Ser: 0.85 mg/dL (ref 0.44–1.00)
GFR, Estimated: >60 mL/min (ref >60)
Glucose, Bld: 88 mg/dL (ref 70–99)
Potassium: 3.9 mmol/L (ref 3.5–5.1)
Sodium: 139 mmol/L (ref 135–145)

## 2021-12-13 LAB — CBC WITH DIFFERENTIAL/PLATELET
Abs Immature Granulocytes: 0.02 10*3/uL (ref 0.00–0.07)
Basophils Absolute: 0.1 10*3/uL (ref 0.0–0.1)
Basophils Relative: 1 %
Eosinophils Absolute: 0.2 10*3/uL (ref 0.0–0.5)
Eosinophils Relative: 3 %
HCT: 34.6 % — ABNORMAL LOW (ref 36.0–46.0)
Hemoglobin: 10.1 g/dL — ABNORMAL LOW (ref 12.0–15.0)
Immature Granulocytes: 0 %
Lymphocytes Relative: 31 %
Lymphs Abs: 1.8 10*3/uL (ref 0.7–4.0)
MCH: 21.4 pg — ABNORMAL LOW (ref 26.0–34.0)
MCHC: 29.2 g/dL — ABNORMAL LOW (ref 30.0–36.0)
MCV: 73.3 fL — ABNORMAL LOW (ref 80.0–100.0)
Monocytes Absolute: 0.3 10*3/uL (ref 0.1–1.0)
Monocytes Relative: 5 %
Neutro Abs: 3.5 10*3/uL (ref 1.7–7.7)
Neutrophils Relative %: 60 %
Platelets: 257 10*3/uL (ref 150–400)
RBC: 4.72 MIL/uL (ref 3.87–5.11)
RDW: 17.1 % — ABNORMAL HIGH (ref 11.5–15.5)
WBC: 5.9 10*3/uL (ref 4.0–10.5)
nRBC: 0 % (ref 0.0–0.2)

## 2021-12-13 MED ORDER — NORETHIN ACE-ETH ESTRAD-FE 1-20 MG-MCG PO TABS: 1.0000 | ORAL_TABLET | Freq: Every day | ORAL | 2 refills | Status: DC

## 2021-12-13 MED ORDER — LACTATED RINGERS IV BOLUS
1000.0000 mL | Freq: Once | INTRAVENOUS | Status: AC
  Administered 2021-12-13: 1000 mL via INTRAVENOUS

## 2021-12-13 NOTE — ED Provider Notes (Signed)
? ?[facility] ?Provider Note ? ? ? Event Date/Time  ? First MD Initiated Contact with Patient 12/13/21 1834   ?  (approximate) ? ? ?History  ? ?Vaginal Bleeding ? ? ?HPI ? ?Faith Ellis is a 36 y.o. female  with history of anemia and as listed in EMR presents to the emergency department for labs to see if anemia is worse and refill of birth control. She is currently menstruating. Cycles are somewhat regular. Over the past couple of months she has had heavier bleeding with clots. She tried to go to the health department to get OC, which worked well during the 3 months that she had them, but because she has her tubes tied, they wouldn't see her. ? ?  ? ? ?Physical Exam  ? ?Triage Vital Signs: ?ED Triage Vitals  ?Enc Vitals Group  ?   BP 12/13/21 1821 (!) 139/59  ?   Pulse Rate 12/13/21 1821 87  ?   Resp 12/13/21 1821 18  ?   Temp 12/13/21 1821 97.8 ?F (36.6 ?C)  ?   Temp Source 12/13/21 1821 Oral  ?   SpO2 12/13/21 1821 100 %  ?   Weight 12/13/21 1819 170 lb (77.1 kg)  ?   Height 12/13/21 1819 '5\' 3"'$  (1.6 m)  ?   Head Circumference --   ?   Peak Flow --   ?   Pain Score 12/13/21 1818 5  ?   Pain Loc --   ?   Pain Edu? --   ?   Excl. in [place]? --   ? ? ?Most recent vital signs: ?Vitals:  ? 12/13/21 1821 12/13/21 2056  ?BP: (!) 139/59 117/62  ?Pulse: 87 72  ?Resp: 18 15  ?Temp: 97.8 ?F (36.6 ?C)   ?SpO2: 100% 100%  ? ? ?General: Awake, no distress.  ?CV:  Good peripheral perfusion.  ?Resp:  Normal effort.  ?Abd:  No distention.  ?Other:   ? ? ?ED Results / Procedures / Treatments  ? ?Labs ?(all labs ordered are listed, but only abnormal results are displayed) ?Labs Reviewed  ?BASIC METABOLIC PANEL - Abnormal; Notable for the following components:  ?    Result Value  ? Calcium 8.8 (*)   ? All other components within normal limits  ?CBC WITH DIFFERENTIAL/PLATELET - Abnormal; Notable for the following components:  ? Hemoglobin 10.1 (*)   ? HCT 34.6 (*)   ? MCV 73.3 (*)   ? MCH 21.4 (*)   ? MCHC 29.2  (*)   ? RDW 17.1 (*)   ? All other components within normal limits  ? ? ? ?EKG ? ? ? ?RADIOLOGY ? ?Image and radiology report reviewed by me. ? ?Not indicated ? ?PROCEDURES: ? ?Critical Care performed: No ? ?Procedures ? ? ?MEDICATIONS ORDERED IN ED: ?Medications  ?lactated ringers bolus 1,000 mL (0 mLs Intravenous Stopped 12/13/21 2039)  ? ? ? ?IMPRESSION / MDM / ASSESSMENT AND PLAN / ED COURSE  ? ?I have reviewed the triage note. ? ?Differential diagnosis includes, but is not limited to, anemia; metorrhagia; leiomyoma ? ?36 year old female presenting to the ER for symptoms and concerns as described in HPI. ? ?Patient is slightly tachycardic. 1 L Lactated ringers ordered. ? ?Labs show a stable anemia of 10.1.  ? ?Pelvic US from 03/20/20 shows intramural fibroid abutting the endometrial canal.  ? ?Tachycardia resolved after fluids. Plan will be to prescribe OC with Fe. She was advised that she  needs to follow up with gynecology for further workup of fibroid and heavy vaginal bleeding. ER return precautions discussed. ? ?  ? ? ?FINAL CLINICAL IMPRESSION(S) / ED DIAGNOSES  ? ?Final diagnoses:  ?Anemia, unspecified type  ?Leiomyoma of body of uterus  ? ? ? ?Rx / DC Orders  ? ?ED Discharge Orders   ? ?      Ordered  ?  norethindrone-ethinyl estradiol-FE (LOESTRIN FE 1/20) 1-20 MG-MCG tablet  Daily,   Status:  Discontinued       ? 12/13/21 2038  ?  norethindrone-ethinyl estradiol-FE (LOESTRIN FE 1/20) 1-20 MG-MCG tablet  Daily       ? 12/13/21 2106  ? ?  ?  ? ?  ? ? ? ?Note:  This document was prepared using Dragon voice recognition software and may include unintentional dictation errors. ?  Victorino Dike, FNP ?12/16/21 4239 ? ?  ?Naaman Plummer, MD ?12/17/21 1518 ? ?

## 2021-12-13 NOTE — ED Triage Notes (Signed)
Pt reports possible dehydration, dizziness, lightheaded, abdominal cramps and increased thirst. Pt states she has a tumor on her uterus and ovarian cyst. Pt reports heavy bleeding and medium sized blood clots.  ?

## 2021-12-13 NOTE — Discharge Instructions (Addendum)
Please follow up with gynecologist when possible. ? ?Return to the ER for symptoms that change or worsen if unable to schedule an appointment. ?

## 2022-01-01 ENCOUNTER — Other Ambulatory Visit: Payer: Self-pay

## 2022-01-01 ENCOUNTER — Emergency Department
Admission: EM | Admit: 2022-01-01 | Discharge: 2022-01-01 | Disposition: A | Discharge status: Home / Self Care | Payer: Medicaid Other | Attending: Student in an Organized Health Care Education/Training Program | Admitting: Student in an Organized Health Care Education/Training Program

## 2022-01-01 ENCOUNTER — Encounter: Payer: Self-pay | Admitting: Emergency Medicine

## 2022-01-01 ENCOUNTER — Emergency Department: Payer: Medicaid Other

## 2022-01-01 DIAGNOSIS — F17200 Nicotine dependence, unspecified, uncomplicated: Secondary | ICD-10-CM | POA: Insufficient documentation

## 2022-01-01 DIAGNOSIS — R051 Acute cough: Secondary | ICD-10-CM

## 2022-01-01 DIAGNOSIS — J40 Bronchitis, not specified as acute or chronic: Secondary | ICD-10-CM | POA: Insufficient documentation

## 2022-01-01 MED ORDER — PREDNISONE 20 MG PO TABS: 40.0000 mg | ORAL_TABLET | Freq: Every day | ORAL | 0 refills | Status: AC

## 2022-01-01 MED ORDER — ALBUTEROL SULFATE HFA 108 (90 BASE) MCG/ACT IN AERS: 2.0000 | INHALATION_SPRAY | Freq: Four times a day (QID) | RESPIRATORY_TRACT | 2 refills | Status: DC | PRN

## 2022-01-01 MED ORDER — AZITHROMYCIN 250 MG PO TABS: ORAL_TABLET | ORAL | 0 refills | Status: AC

## 2022-01-01 NOTE — ED Provider Notes (Signed)
? ?[facility] ?Provider Note ? ? ? Event Date/Time  ? First MD Initiated Contact with Patient 01/01/22 [phone]   ?  (approximate) ? ? ?History  ? ?Cough and Shortness of Breath ? ? ?HPI ? ?Faith Ellis is a 36 y.o. female with a history of bronchitis daily smoker presents to the ER for 3 to 4 days of productive cough becoming increasingly more productive now green and tinge.  Has not been having any measured fevers or chills.  Took a COVID test yesterday was negative.  Came to the ER today because her coworkers made her anxious about having possible pneumonia.  She denies any chest pain.  No pain with deep inspiration.  No nausea or vomiting.  No recent antibiotics. ?  ? ? ?Physical Exam  ? ?Triage Vital Signs: ?ED Triage Vitals  ?Enc Vitals Group  ?   BP 01/01/22 0938 139/69  ?   Pulse Rate 01/01/22 0938 86  ?   Resp 01/01/22 0938 18  ?   Temp 01/01/22 0938 98.4 ?F (36.9 ?C)  ?   Temp Source 01/01/22 0938 Oral  ?   SpO2 01/01/22 0938 99 %  ?   Weight 01/01/22 0931 169 lb 15.6 oz (77.1 kg)  ?   Height 01/01/22 0931 '5\' 3"'$  (1.6 m)  ?   Head Circumference --   ?   Peak Flow --   ?   Pain Score 01/01/22 0931 3  ?   Pain Loc --   ?   Pain Edu? --   ?   Excl. in [place]? --   ? ? ?Most recent vital signs: ?Vitals:  ? 01/01/22 0938  ?BP: 139/69  ?Pulse: 86  ?Resp: 18  ?Temp: 98.4 ?F (36.9 ?C)  ?SpO2: 99%  ? ? ? ?Constitutional: Alert  ?Eyes: Conjunctivae are normal.  ?Head: Atraumatic. ?Nose: No congestion/rhinnorhea. ?Mouth/Throat: Mucous membranes are moist.   ?Neck: Painless ROM.  ?Cardiovascular:   Good peripheral circulation. No m/g/r ?Respiratory: Normal respiratory effort.  No retractions. Coarse bibasilar breathsounds, occasional faint expiratory wheeze, no rhonchi ?Gastrointestinal: Soft and nontender.  ?Musculoskeletal:  no deformity ?Neurologic:  MAE spontaneously. No gross focal neurologic deficits are appreciated.  ?Skin:  Skin is warm, dry and intact. No rash noted. ?Psychiatric: Mood and  affect are normal. Speech and behavior are normal. ? ? ? ?ED Results / Procedures / Treatments  ? ?Labs ?(all labs ordered are listed, but only abnormal results are displayed) ?Labs Reviewed - No data to display ? ? ?EKG ? ? ? ? ?RADIOLOGY ?Please see ED Course for my review and interpretation. ? ?I personally reviewed all radiographic images ordered to evaluate for the above acute complaints and reviewed radiology reports and findings.  These findings were personally discussed with the patient.  Please see medical record for radiology report. ? ? ? ?PROCEDURES: ? ?Critical Care performed: No ? ?Procedures ? ? ?MEDICATIONS ORDERED IN ED: ?Medications - No data to display ? ? ?IMPRESSION / MDM / ASSESSMENT AND PLAN / ED COURSE  ?I reviewed the triage vital signs and the nursing notes. ?             ?               ? ?Differential diagnosis includes, but is not limited to, bronchitis, asthma, pna, ptx, uri, ? ?Presenting to the ER with symptoms as described above.  Patient afebrile hemodynamically stable not tachycardic clinically very well-appearing does have some findings  consistent with bronchitis possible pneumonia.  Denies any chest pain or pressure not consistent with ACS, not consistent with CHF.  My review interpretation of chest x-ray there is no evidence of infiltrate or pneumothorax.  Does not seem consistent with PE clinically as findings are more clinically consistent with bronchitis patient is a smoker with productive cough.  Not septic.  We will treat for bronchitis.  We discussed return precautions.  Patient agreeable to plan ?  ? ? ?FINAL CLINICAL IMPRESSION(S) / ED DIAGNOSES  ? ?Final diagnoses:  ?Acute cough  ?Bronchitis  ? ? ? ?Rx / DC Orders  ? ?ED Discharge Orders   ? ? None  ? ?  ? ? ? ?Note:  This document was prepared using Dragon voice recognition software and may include unintentional dictation errors. ? ?  ?Merlyn Lot, MD ?01/01/22 1031 ? ?

## 2022-01-01 NOTE — ED Triage Notes (Signed)
Pt comes into the ED via POV c/o cough and SHOB that has been ongoing since last week but has progressively gotten worse.  Pt states the cough is productive with thick green sputum.  Pt took a home COVID test that was negative.  Pt currently has even and unlabored respirations and is in NAD.  ?

## 2022-01-22 ENCOUNTER — Other Ambulatory Visit: Payer: Self-pay

## 2022-01-22 ENCOUNTER — Emergency Department: Payer: Self-pay

## 2022-01-22 ENCOUNTER — Emergency Department
Admission: EM | Admit: 2022-01-22 | Discharge: 2022-01-22 | Disposition: A | Discharge status: Home / Self Care | Payer: Self-pay | Attending: Emergency Medicine | Admitting: Emergency Medicine

## 2022-01-22 DIAGNOSIS — N3000 Acute cystitis without hematuria: Secondary | ICD-10-CM | POA: Insufficient documentation

## 2022-01-22 DIAGNOSIS — N939 Abnormal uterine and vaginal bleeding, unspecified: Secondary | ICD-10-CM

## 2022-01-22 LAB — URINALYSIS, ROUTINE W REFLEX MICROSCOPIC
Bilirubin Urine: NEGATIVE
Glucose, UA: NEGATIVE mg/dL
Ketones, ur: NEGATIVE mg/dL
Leukocytes,Ua: NEGATIVE
Nitrite: POSITIVE — AB
Protein, ur: NEGATIVE mg/dL
Specific Gravity, Urine: 1.004 — ABNORMAL LOW (ref 1.005–1.030)
WBC, UA: NONE SEEN WBC/hpf (ref 0–5)
pH: 6 (ref 5.0–8.0)

## 2022-01-22 LAB — CBC
HCT: 35.4 % — ABNORMAL LOW (ref 36.0–46.0)
Hemoglobin: 10.5 g/dL — ABNORMAL LOW (ref 12.0–15.0)
MCH: 21.4 pg — ABNORMAL LOW (ref 26.0–34.0)
MCHC: 29.7 g/dL — ABNORMAL LOW (ref 30.0–36.0)
MCV: 72.1 fL — ABNORMAL LOW (ref 80.0–100.0)
Platelets: 269 10*3/uL (ref 150–400)
RBC: 4.91 MIL/uL (ref 3.87–5.11)
RDW: 17.9 % — ABNORMAL HIGH (ref 11.5–15.5)
WBC: 7.6 10*3/uL (ref 4.0–10.5)
nRBC: 0 % (ref 0.0–0.2)

## 2022-01-22 LAB — HCG, QUANTITATIVE, PREGNANCY: hCG, Beta Chain, Quant, S: <1 m[IU]/mL (ref <5)

## 2022-01-22 LAB — POC URINE PREG, ED: Preg Test, Ur: NEGATIVE

## 2022-01-22 MED ORDER — NORETHINDRONE-ETH ESTRADIOL 0.5-35 MG-MCG PO TABS: 1.0000 | ORAL_TABLET | Freq: Every day | ORAL | 1 refills | Status: DC

## 2022-01-22 MED ORDER — NORETHINDRONE-ETH ESTRADIOL 0.5-35 MG-MCG PO TABS: ORAL_TABLET | ORAL | 0 refills | Status: DC

## 2022-01-22 MED ORDER — SULFAMETHOXAZOLE-TRIMETHOPRIM 800-160 MG PO TABS
1.0000 | ORAL_TABLET | Freq: Once | ORAL | Status: AC
  Administered 2022-01-22: 1 via ORAL
  Filled 2022-01-22: qty 1

## 2022-01-22 MED ORDER — PROMETHAZINE HCL 25 MG PO TABS: 25.0000 mg | ORAL_TABLET | Freq: Three times a day (TID) | ORAL | 0 refills | Status: DC | PRN

## 2022-01-22 MED ORDER — HYDROCODONE-ACETAMINOPHEN 5-325 MG PO TABS: 1.0000 | ORAL_TABLET | Freq: Three times a day (TID) | ORAL | 0 refills | Status: AC | PRN

## 2022-01-22 MED ORDER — NORETHINDRONE-ETH ESTRADIOL 0.5-35 MG-MCG PO TABS: 1.0000 | ORAL_TABLET | Freq: Every day | ORAL | 3 refills | Status: DC

## 2022-01-22 MED ORDER — SULFAMETHOXAZOLE-TRIMETHOPRIM 800-160 MG PO TABS: 1.0000 | ORAL_TABLET | Freq: Two times a day (BID) | ORAL | 0 refills | Status: AC

## 2022-01-22 MED ORDER — HYDROCODONE-ACETAMINOPHEN 5-325 MG PO TABS
1.0000 | ORAL_TABLET | Freq: Once | ORAL | Status: AC
  Administered 2022-01-22: 1 via ORAL
  Filled 2022-01-22: qty 1

## 2022-01-22 NOTE — ED Triage Notes (Signed)
Pt comes with c/o right pelvic pain that started this am. Pt stats vaginal bleeding since April 5th. Pt states she is on [facility] and tubes are tied. ? ?Pt states it is like a normal period amount of bleeding. ?

## 2022-01-22 NOTE — Discharge Instructions (Addendum)
Take the antibiotic as prescribed for UTI.  Take the new birth control and tapered dose, as directed for the next 5 days for abnormal bleeding.  Start the new birth control daily as directed.  You must follow-up with a local GYN provider for further evaluation and management of your abnormal uterine bleeding. ?

## 2022-01-22 NOTE — ED Triage Notes (Signed)
First Nurse  Note:  C/O vaginal bleeding since April, but today c/o RLQ and right flank pain.  Has history of right ovarian cyst. ? ?AAOx3.  Skin warm and dry. NAD ?

## 2022-01-22 NOTE — ED Provider Notes (Signed)
? ? ?[facility] ?Emergency Department Provider Note ? ? ? ? Event Date/Time  ? First MD Initiated Contact with Patient 01/22/22 1837   ?  (approximate) ? ? ?History  ? ?Vaginal Bleeding ? ? ?HPI ? ?Faith Ellis is a 36 y.o. female with a history of anemia, gastric ulcers, tubal ligation and prior C-section, presents to the ED with vaginal bleeding and right pelvic pain.  Patient reports onset of pelvic pain this morning.  She would note that she has been on her current menses with irregular bleeding since April 5.  Patient also reports she is on oral birth control, to minimize heavy menstrual bleeding, but continues to bleed despite continuous dosing.  Patient gives a history of right-sided ovarian cyst in the past and a recently diagnosed uterine fibroid. She notes some bilateral flank discomfort and urinary frequency. She denies concern for STI.  ?  ? ? ?Physical Exam  ? ?Triage Vital Signs: ?ED Triage Vitals  ?Enc Vitals Group  ?   BP 01/22/22 1728 118/64  ?   Pulse Rate 01/22/22 1728 80  ?   Resp 01/22/22 1728 18  ?   Temp 01/22/22 1728 98.2 ?F (36.8 ?C)  ?   Temp src --   ?   SpO2 01/22/22 1728 100 %  ?   Weight --   ?   Height --   ?   Head Circumference --   ?   Peak Flow --   ?   Pain Score 01/22/22 1726 8  ?   Pain Loc --   ?   Pain Edu? --   ?   Excl. in [place]? --   ? ? ?Most recent vital signs: ?Vitals:  ? 01/22/22 1728 01/22/22 2153  ?BP: 118/64 122/66  ?Pulse: 80 82  ?Resp: 18 20  ?Temp: 98.2 ?F (36.8 ?C)   ?SpO2: 100% 98%  ? ? ?General Awake, no distress.  ?CV:  Good peripheral perfusion.  ?RESP:  Normal effort.  ?ABD:  No distention. Soft nontender. Mild R>L flank tenderness ?GU:   deferred ? ? ?ED Results / Procedures / Treatments  ? ?Labs ?(all labs ordered are listed, but only abnormal results are displayed) ?Labs Reviewed  ?CBC - Abnormal; Notable for the following components:  ?    Result Value  ? Hemoglobin 10.5 (*)   ? HCT 35.4 (*)   ? MCV 72.1 (*)   ? MCH 21.4 (*)   ?  MCHC 29.7 (*)   ? RDW 17.9 (*)   ? All other components within normal limits  ?URINALYSIS, ROUTINE W REFLEX MICROSCOPIC - Abnormal; Notable for the following components:  ? Color, Urine AMBER (*)   ? APPearance CLEAR (*)   ? Specific Gravity, Urine 1.004 (*)   ? Hgb urine dipstick MODERATE (*)   ? Nitrite POSITIVE (*)   ? Bacteria, UA RARE (*)   ? All other components within normal limits  ?HCG, QUANTITATIVE, PREGNANCY  ?POC URINE PREG, ED  ? ? ? ?EKG ? ?RADIOLOGY ? ?I personally viewed and evaluated these images as part of my medical decision making, as well as reviewing the written report by the radiologist. ? ?ED Provider Interpretation: no acute findings} ? ?US PELVIC COMPLETE W TRANSVAGINAL AND TORSION R/O ? ?Result Date: 01/22/2022 ?CLINICAL DATA:  Vaginal bleeding since 01/01/2022. History of fibroids. EXAM: TRANSABDOMINAL AND TRANSVAGINAL ULTRASOUND OF PELVIS DOPPLER ULTRASOUND OF OVARIES TECHNIQUE: Both transabdominal and transvaginal ultrasound examinations of the pelvis  were performed. Transabdominal technique was performed for global imaging of the pelvis including uterus, ovaries, adnexal regions, and pelvic cul-de-sac. It was necessary to proceed with endovaginal exam following the transabdominal exam to visualize the endometrium and ovaries. Color and duplex Doppler ultrasound was utilized to evaluate blood flow to the ovaries. COMPARISON:  Pelvic ultrasound 03/21/2020 FINDINGS: Uterus Measurements: 9.2 x 4.5 x 5.4 cm = volume: 116 mL. The uterus is anteverted. Myometrium is diffusely heterogeneous. Posterior hypoechoic fibroid measuring 1.8 x 1.4 x 1.9 cm appears intramural, increased in size from prior exam when it measured 1.7 x 0.9 x 1.5 cm. There is an additional 1.6 x 1.2 x 1.6 cm subserosal fibroid in the lower uterine segment posteriorly. Endometrium Thickness: 4 mm.  No focal abnormality visualized. Right ovary Measurements: 4.3 x 2.7 x 3.4 cm = volume: 21 mL. Simple anechoic cyst measures 2.7  x 2.6 x 3.2 cm. Normal blood flow to the adjacent ovarian parenchyma. No adnexal mass. Left ovary Measurements: 3.0 x 1.8 x 2.2 cm = volume: 6.4 mL. Normal physiologic appearance with follicles. No cyst or solid lesion. No adnexal mass. Pulsed Doppler evaluation of both ovaries demonstrates normal low-resistance arterial and venous waveforms. Other findings No abnormal free fluid. IMPRESSION: 1. Uterine intramural fibroids, larger fibroid has slightly increased in size from prior exam. There is also a small new fibroid. 2. Normal endometrial thickness of 4 mm. If bleeding remains unresponsive to hormonal or medical therapy, sonohysterogram should be considered for focal lesion work-up. (Ref: Radiological Reasoning: Algorithmic Workup of Abnormal Vaginal Bleeding with Endovaginal Sonography and Sonohysterography. AJR 2008; 361:W43-15) 3. Simple right ovarian cyst measures 3.2 cm. No dedicated further follow-up per characterization is needed. 4. Normal blood flow to both ovaries. Electronically Signed   By: Keith Rake M.D.   On: 01/22/2022 21:07   ? ? ?PROCEDURES: ? ?Critical Care performed: No ? ?Procedures ? ? ?MEDICATIONS ORDERED IN ED: ?Medications  ?sulfamethoxazole-trimethoprim (BACTRIM DS) 800-160 MG per tablet 1 tablet (1 tablet Oral Given 01/22/22 2150)  ?HYDROcodone-acetaminophen (NORCO/VICODIN) 5-325 MG per tablet 1 tablet (1 tablet Oral Given 01/22/22 2150)  ? ? ? ?IMPRESSION / MDM / ASSESSMENT AND PLAN / ED COURSE  ?I reviewed the triage vital signs and the nursing notes. ?             ?               ? ?Differential diagnosis includes, but is not limited to, ovarian cyst, ovarian torsion, acute appendicitis, diverticulitis, urinary tract infection/pyelonephritis, endometriosis, bowel obstruction, colitis, renal colic, gastroenteritis, hernia, fibroids, pregnancy related pain including ectopic pregnancy, etc. ? ? ?Patient to the ED with reports of persistent and heavy vaginal bleeding since her last  menses on April 5.  Patient presents in no acute distress noting some right-sided flank and pelvic discomfort.  She is currently on oral contraceptives which she has been taking continuously, but continues to experience breakthrough bleeding.  Patient is evaluated for complaints in ED, found have reassuring work-up.  No evidence of critical anemia as her H&H are stable from last month.  No acute electrolyte abnormalities.  UTI does show some nitrite positive urine and some mild bacteriuria.  Pelvic ultrasound does not reveal any pelvic torsion but does note a right-sided ovarian cyst measuring 2 cm at its largest diameter.  Patient also has slightly enlarged intramural fibroid from previous exam as well as a new subserosal fibroid appreciated.  Patient's diagnosis is consistent with abnormal uterine bleeding and  UTI. Patient will be discharged home with prescriptions for Bactrim and a 35 mcg OCP to be taken at a tapered dose x5 days to reduce acute bleeding.  Patient will continue with daily OCP as directed.  Patient is to follow up with GYN provider as strongly suggested on previous visit. Patient is given ED precautions to return to the ED for any worsening or new symptoms. ? ? ?FINAL CLINICAL IMPRESSION(S) / ED DIAGNOSES  ? ?Final diagnoses:  ?Abnormal uterine bleeding (AUB)  ?Acute cystitis without hematuria  ? ? ? ?Rx / DC Orders  ? ?ED Discharge Orders   ? ?      Ordered  ?  norethindrone-ethinyl estradiol (NECON) 0.5-35 MG-MCG tablet       ? 01/22/22 2034  ?  promethazine (PHENERGAN) 25 MG tablet  Every 8 hours PRN       ? 01/22/22 2034  ?  norethindrone-ethinyl estradiol (NECON) 0.5-35 MG-MCG tablet  Daily,   Status:  Discontinued       ? 01/22/22 2047  ?  sulfamethoxazole-trimethoprim (BACTRIM DS) 800-160 MG tablet  2 times daily       ? 01/22/22 2047  ?  HYDROcodone-acetaminophen (NORCO) 5-325 MG tablet  3 times daily PRN       ? 01/22/22 2047  ?  norethindrone-ethinyl estradiol (NECON) 0.5-35 MG-MCG  tablet  Daily       ? 01/22/22 2148  ? ?  ?  ? ?  ? ? ? ?Note:  This document was prepared using Dragon voice recognition software and may include unintentional dictation errors. ? ?  ?Azilee Pirro V Ba

## 2022-01-22 NOTE — ED Notes (Signed)
E-signature pad unavailable - Pt verbalized understanding of D/C information - no additional concerns at this time.  

## 2022-02-25 IMAGING — CR DG KNEE COMPLETE 4+V*R*
1 series · 4 of 4 positions shown · non-contrast
Comparison: None.

CLINICAL DATA: Right knee pain

EXAM:
RIGHT KNEE - COMPLETE 4+ VIEW

[Series 1: dg knee complete 4 views right · 0.14mm/px · 4 of 4 slices shown]
[im 1/4]
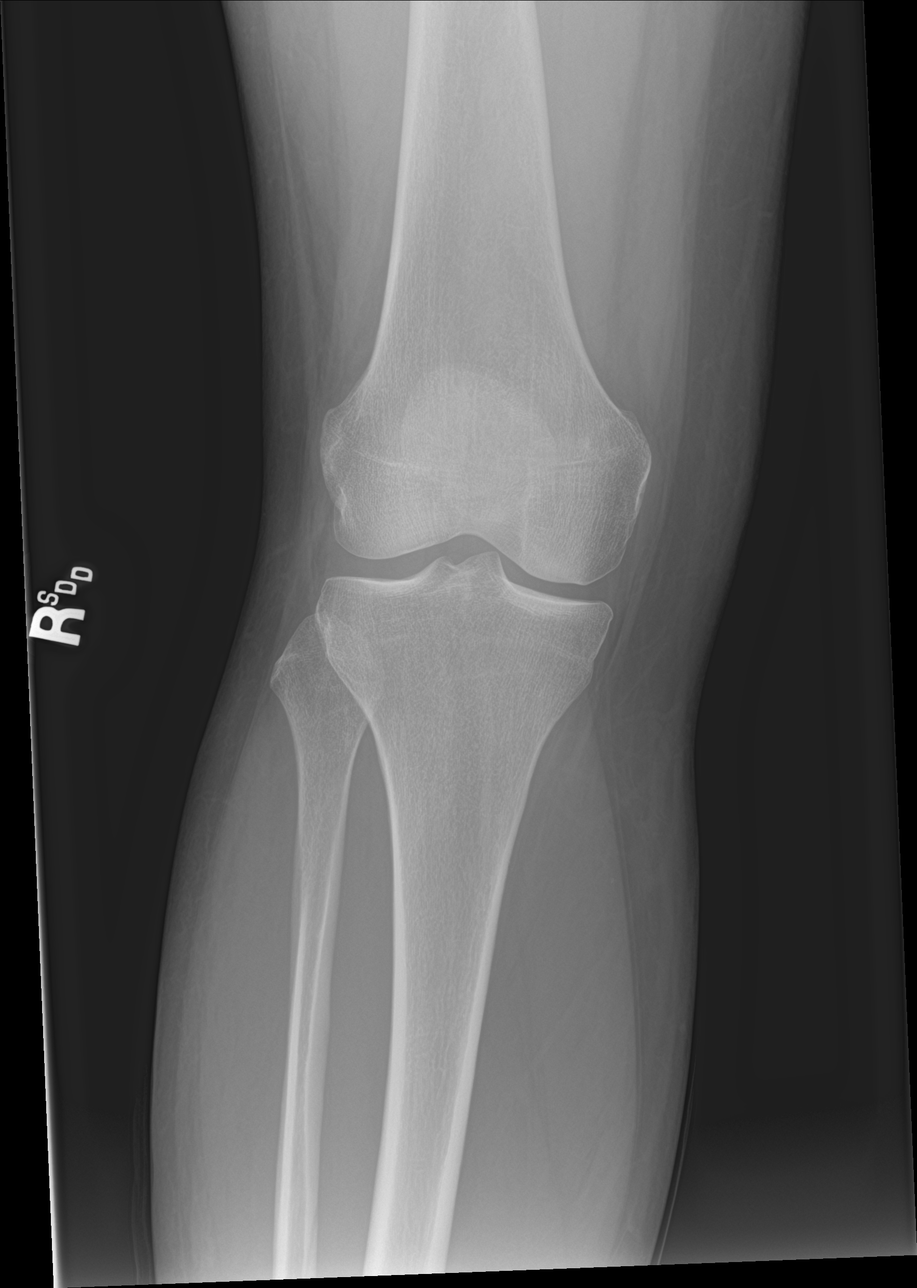
[im 2/4]
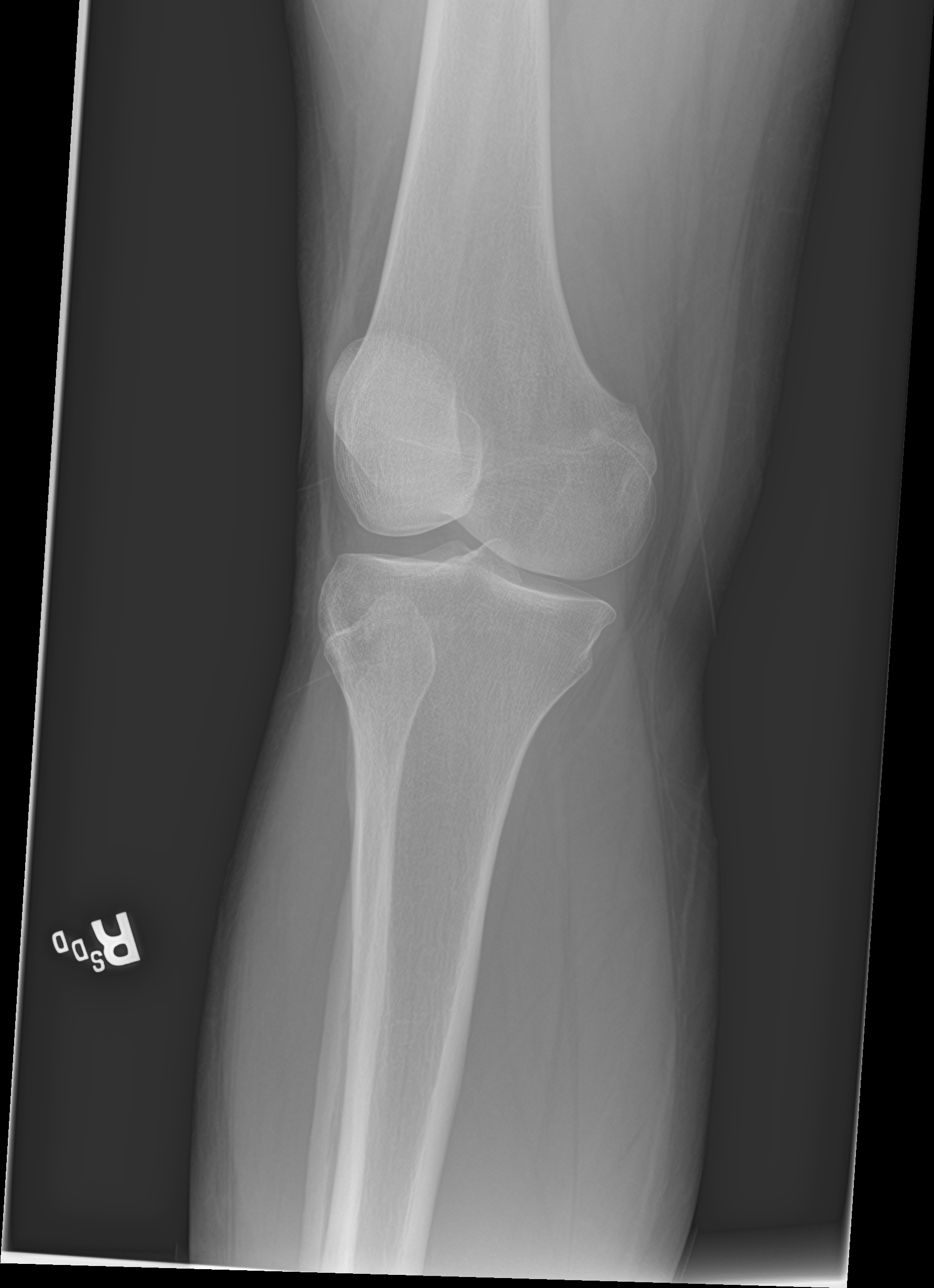
[im 3/4]
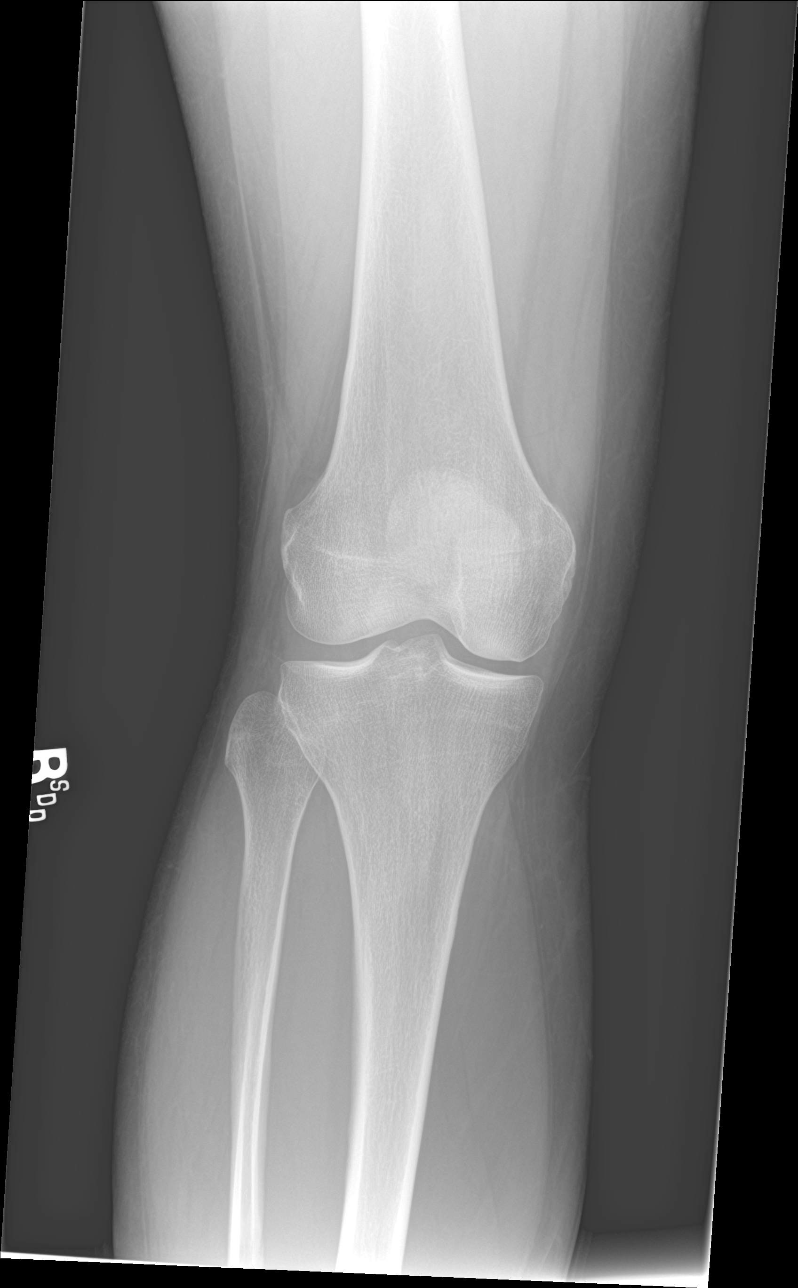
[im 4/4]
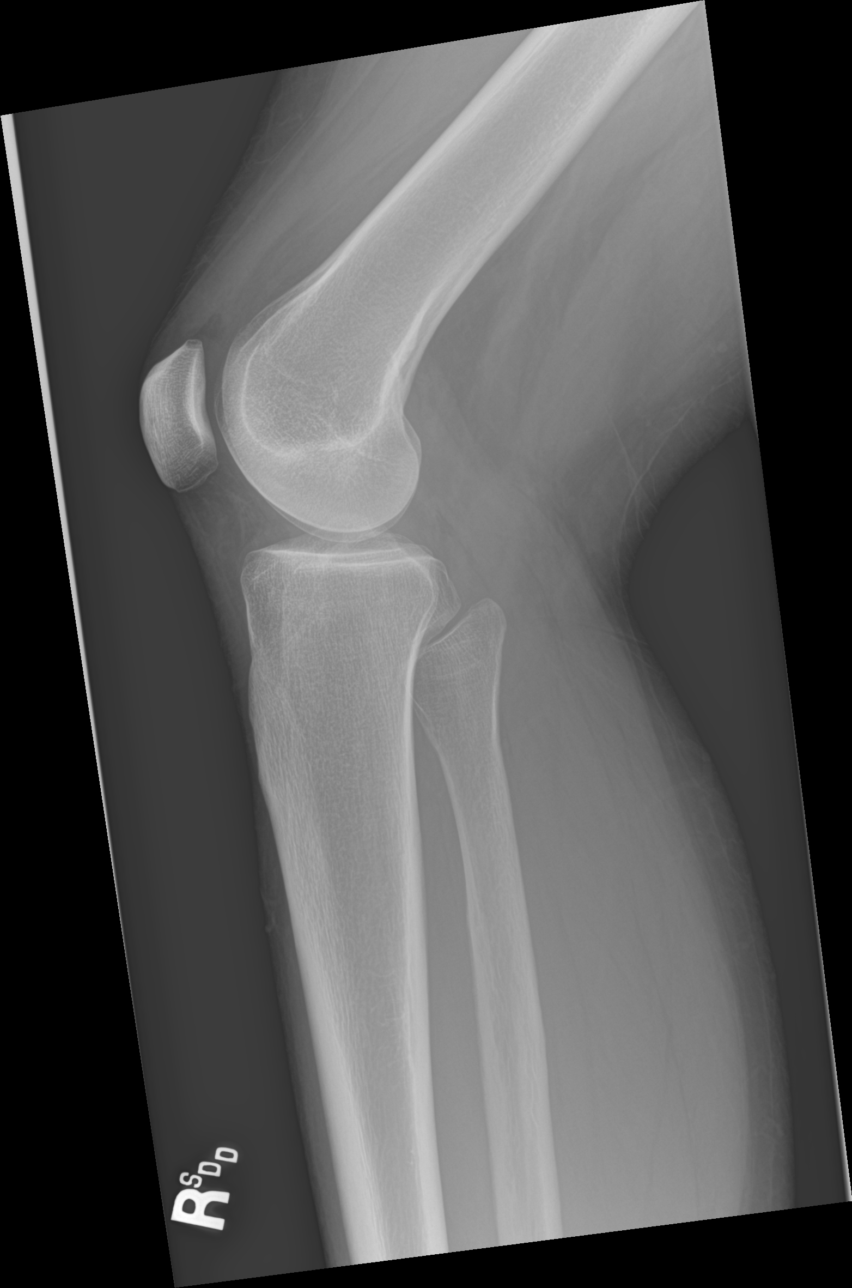

[4 of 4 positions shown; findings below may reference images not displayed]

FINDINGS: No evidence of fracture, dislocation, or joint effusion. No evidence
of arthropathy or other focal bone abnormality. Soft tissues are
unremarkable.
IMPRESSION: Negative.

## 2022-06-22 ENCOUNTER — Other Ambulatory Visit: Payer: Self-pay

## 2022-06-22 ENCOUNTER — Encounter: Payer: Self-pay | Admitting: Emergency Medicine

## 2022-06-22 ENCOUNTER — Emergency Department
Admission: EM | Admit: 2022-06-22 | Discharge: 2022-06-22 | Disposition: A | Discharge status: Home / Self Care | Payer: Self-pay | Attending: Emergency Medicine | Admitting: Emergency Medicine

## 2022-06-22 ENCOUNTER — Emergency Department: Payer: Medicaid Other

## 2022-06-22 DIAGNOSIS — J069 Acute upper respiratory infection, unspecified: Secondary | ICD-10-CM | POA: Insufficient documentation

## 2022-06-22 DIAGNOSIS — Z20822 Contact with and (suspected) exposure to covid-19: Secondary | ICD-10-CM | POA: Insufficient documentation

## 2022-06-22 DIAGNOSIS — H6690 Otitis media, unspecified, unspecified ear: Secondary | ICD-10-CM

## 2022-06-22 DIAGNOSIS — H6692 Otitis media, unspecified, left ear: Secondary | ICD-10-CM | POA: Insufficient documentation

## 2022-06-22 LAB — RESP PANEL BY RT-PCR (FLU A&B, COVID) ARPGX2
Influenza A by PCR: NEGATIVE
Influenza B by PCR: NEGATIVE
SARS Coronavirus 2 by RT PCR: NEGATIVE

## 2022-06-22 LAB — GROUP A STREP BY PCR: Group A Strep by PCR: NOT DETECTED

## 2022-06-22 MED ORDER — ACETAMINOPHEN 325 MG PO TABS
650.0000 mg | ORAL_TABLET | Freq: Once | ORAL | Status: AC
  Administered 2022-06-22: 650 mg via ORAL
  Filled 2022-06-22: qty 2

## 2022-06-22 MED ORDER — AMOXICILLIN-POT CLAVULANATE 875-125 MG PO TABS: 1.0000 | ORAL_TABLET | Freq: Two times a day (BID) | ORAL | 0 refills | Status: AC

## 2022-06-22 MED ORDER — AMOXICILLIN-POT CLAVULANATE 875-125 MG PO TABS: 1.0000 | ORAL_TABLET | Freq: Two times a day (BID) | ORAL | 0 refills | Status: DC

## 2022-06-22 NOTE — ED Notes (Signed)
See triage note, c/o covid sx that started yesterday. NAD noted.

## 2022-06-22 NOTE — Discharge Instructions (Signed)
You may take the antibiotic as prescribed for your ear pain.  Your COVID/flu/RSV and strep test were negative.  You may continue to take Tylenol/ibuprofen per package instructions to help with your pain.  It was a pleasure caring for you today.

## 2022-06-22 NOTE — ED Triage Notes (Signed)
Pt reports yesterday started with cough, congestion, left ear pain, sore throat and fevers.

## 2022-06-22 NOTE — ED Provider Notes (Signed)
[facility] Provider Note    Event Date/Time   First MD Initiated Contact with Patient 06/22/22 [phone]     (approximate)   History   Cough, Otalgia, and Nasal Congestion   HPI  Faith Ellis is a 36 y.o. female who presents today for evaluation of cough, runny nose, sore throat, and left ear pain.  Patient reports that her cough and runny nose began approximately 1 week ago, and her ear pain began today.  She reports that there are multiple people in her workplace where out with colds and COVID.  Patient denies having had a fever.  She has not taken any Tylenol or ibuprofen today.  Patient Active Problem List   Diagnosis Date Noted   Anemia 01/29/2016          Physical Exam   Triage Vital Signs: ED Triage Vitals  Enc Vitals Group     BP 06/22/22 0913 127/82     Pulse Rate 06/22/22 0913 68     Resp 06/22/22 0913 20     Temp 06/22/22 0913 97.8 F (36.6 C)     Temp Source 06/22/22 0913 Oral     SpO2 06/22/22 0913 97 %     Weight 06/22/22 0908 169 lb 12.1 oz (77 kg)     Height 06/22/22 0908 '5\' 3"'$  (1.6 m)     Head Circumference --      Peak Flow --      Pain Score 06/22/22 0907 6     Pain Loc --      Pain Edu? --      Excl. in [place]? --     Most recent vital signs: Vitals:   06/22/22 0913  BP: 127/82  Pulse: 68  Resp: 20  Temp: 97.8 F (36.6 C)  SpO2: 97%    Physical Exam Vitals and nursing note reviewed.  Constitutional:      General: Awake and alert. No acute distress.    Appearance: Normal appearance. The patient is normal weight.  HENT:     Head: Normocephalic and atraumatic.     Mouth: Mucous membranes are moist.  Uvula midline.  No tonsillar exudate.  No soft palate fluctuance.  No trismus.  No voice change.  No sublingual swelling.  No tender cervical lymphadenopathy.  No nuchal rigidity Left ear: Erythematous TM, normal canal and pinna.  No mastoid tenderness or erythema.  No proptosis of pinna Right ear: Normal TM,  normal canal and pinna.  No mastoid tenderness or erythema.  No proptosis of pinna Eyes:     General: PERRL. Normal EOMs        Right eye: No discharge.        Left eye: No discharge.     Conjunctiva/sclera: Conjunctivae normal.  Cardiovascular:     Rate and Rhythm: Normal rate and regular rhythm.     Pulses: Normal pulses.     Heart sounds: Normal heart sounds Pulmonary:     Effort: Pulmonary effort is normal. No respiratory distress.     Breath sounds: Normal breath sounds.  Able to speak easily in complete sentences without respiratory distress Abdominal:     Abdomen is soft. There is no abdominal tenderness. No rebound or guarding. No distention. Musculoskeletal:        General: No swelling. Normal range of motion.     Cervical back: Normal range of motion and neck supple.  Skin:    General: Skin is warm and dry.  Capillary Refill: Capillary refill takes less than 2 seconds.     Findings: No rash.  Neurological:     Mental Status: The patient is awake and alert.  Face symmetric     ED Results / Procedures / Treatments   Labs (all labs ordered are listed, but only abnormal results are displayed) Labs Reviewed  RESP PANEL BY RT-PCR (FLU A&B, COVID) ARPGX2  GROUP A STREP BY PCR     EKG     RADIOLOGY     PROCEDURES:  Critical Care performed:   Procedures   MEDICATIONS ORDERED IN ED: Medications  acetaminophen (TYLENOL) tablet 650 mg (650 mg Oral Given 06/22/22 0926)     IMPRESSION / MDM / ASSESSMENT AND PLAN / ED COURSE  I reviewed the triage vital signs and the nursing notes.   Differential diagnosis includes, but is not limited to, URI, COVID, influenza, strep throat, bronchitis, pneumonia.  Patient is awake and alert, hemodynamically stable and afebrile.  She demonstrates no increased work of breathing.  COVID/flu/RSV test obtained as well as a chest x-ray given her cough and subjective fever.  Her uvula is midline, no tonsillar exudate, no voice  change or trismus, do not suspect peritonsillar or retropharyngeal abscess.  Strep, COVID, flu, RSV test were negative.  She was started on Augmentin for her otitis media.  No clinical signs or symptoms of mastoiditis.  We discussed return precautions and the importance of close outpatient follow-up.  Also discussed symptomatic management.  Patient understands and agrees with plan.  She was discharged in stable condition.   Patient's presentation is most consistent with acute complicated illness / injury requiring diagnostic workup.      FINAL CLINICAL IMPRESSION(S) / ED DIAGNOSES   Final diagnoses:  Upper respiratory tract infection, unspecified type  Acute otitis media, unspecified otitis media type     Rx / DC Orders   ED Discharge Orders          Ordered    amoxicillin-clavulanate (AUGMENTIN) 875-125 MG tablet  2 times daily,   Status:  Discontinued        06/22/22 1017    amoxicillin-clavulanate (AUGMENTIN) 875-125 MG tablet  2 times daily        06/22/22 1043             Note:  This document was prepared using Dragon voice recognition software and may include unintentional dictation errors.   Marquette Old, PA-C 06/22/22 1200    Duffy Bruce, MD 06/22/22 1537

## 2022-06-27 ENCOUNTER — Emergency Department: Payer: Medicaid Other

## 2022-06-27 ENCOUNTER — Other Ambulatory Visit: Payer: Self-pay

## 2022-06-27 ENCOUNTER — Emergency Department
Admission: EM | Admit: 2022-06-27 | Discharge: 2022-06-27 | Disposition: A | Discharge status: Home / Self Care | Payer: Medicaid Other | Attending: Student in an Organized Health Care Education/Training Program | Admitting: Student in an Organized Health Care Education/Training Program

## 2022-06-27 ENCOUNTER — Encounter: Payer: Self-pay | Admitting: Emergency Medicine

## 2022-06-27 DIAGNOSIS — R110 Nausea: Secondary | ICD-10-CM | POA: Insufficient documentation

## 2022-06-27 DIAGNOSIS — R519 Headache, unspecified: Secondary | ICD-10-CM | POA: Insufficient documentation

## 2022-06-27 DIAGNOSIS — Z20822 Contact with and (suspected) exposure to covid-19: Secondary | ICD-10-CM | POA: Insufficient documentation

## 2022-06-27 DIAGNOSIS — R0602 Shortness of breath: Secondary | ICD-10-CM | POA: Insufficient documentation

## 2022-06-27 DIAGNOSIS — R6883 Chills (without fever): Secondary | ICD-10-CM | POA: Insufficient documentation

## 2022-06-27 LAB — CBC WITH DIFFERENTIAL/PLATELET
Abs Immature Granulocytes: 0.02 10*3/uL (ref 0.00–0.07)
Basophils Absolute: 0 10*3/uL (ref 0.0–0.1)
Basophils Relative: 1 %
Eosinophils Absolute: 0.2 10*3/uL (ref 0.0–0.5)
Eosinophils Relative: 3 %
HCT: 36.1 % (ref 36.0–46.0)
Hemoglobin: 10.6 g/dL — ABNORMAL LOW (ref 12.0–15.0)
Immature Granulocytes: 0 %
Lymphocytes Relative: 26 %
Lymphs Abs: 1.4 10*3/uL (ref 0.7–4.0)
MCH: 21.5 pg — ABNORMAL LOW (ref 26.0–34.0)
MCHC: 29.4 g/dL — ABNORMAL LOW (ref 30.0–36.0)
MCV: 73.4 fL — ABNORMAL LOW (ref 80.0–100.0)
Monocytes Absolute: 0.3 10*3/uL (ref 0.1–1.0)
Monocytes Relative: 6 %
Neutro Abs: 3.4 10*3/uL (ref 1.7–7.7)
Neutrophils Relative %: 64 %
Platelets: 272 10*3/uL (ref 150–400)
RBC: 4.92 MIL/uL (ref 3.87–5.11)
RDW: 17.5 % — ABNORMAL HIGH (ref 11.5–15.5)
WBC: 5.3 10*3/uL (ref 4.0–10.5)
nRBC: 0 % (ref 0.0–0.2)

## 2022-06-27 LAB — BASIC METABOLIC PANEL
Anion gap: 8 (ref 5–15)
BUN: 9 mg/dL (ref 6–20)
CO2: 21 mmol/L — ABNORMAL LOW (ref 22–32)
Calcium: 8.9 mg/dL (ref 8.9–10.3)
Chloride: 107 mmol/L (ref 98–111)
Creatinine, Ser: 0.7 mg/dL (ref 0.44–1.00)
GFR, Estimated: >60 mL/min (ref >60)
Glucose, Bld: 148 mg/dL — ABNORMAL HIGH (ref 70–99)
Potassium: 3.8 mmol/L (ref 3.5–5.1)
Sodium: 136 mmol/L (ref 135–145)

## 2022-06-27 LAB — SARS CORONAVIRUS 2 BY RT PCR: SARS Coronavirus 2 by RT PCR: NEGATIVE

## 2022-06-27 LAB — D-DIMER, QUANTITATIVE: D-Dimer, Quant: 0.29 ug/mL-FEU (ref 0.00–0.50)

## 2022-06-27 MED ORDER — ACETAMINOPHEN 500 MG PO TABS
1000.0000 mg | ORAL_TABLET | Freq: Once | ORAL | Status: AC
  Administered 2022-06-27: 1000 mg via ORAL
  Filled 2022-06-27: qty 2

## 2022-06-27 MED ORDER — PROCHLORPERAZINE EDISYLATE 10 MG/2ML IJ SOLN
10.0000 mg | Freq: Once | INTRAMUSCULAR | Status: AC
  Administered 2022-06-27: 10 mg via INTRAVENOUS
  Filled 2022-06-27: qty 2

## 2022-06-27 MED ORDER — DIPHENHYDRAMINE HCL 50 MG/ML IJ SOLN
12.5000 mg | Freq: Once | INTRAMUSCULAR | Status: AC
  Administered 2022-06-27: 12.5 mg via INTRAVENOUS
  Filled 2022-06-27: qty 1

## 2022-06-27 MED ORDER — SODIUM CHLORIDE 0.9 % IV BOLUS
1000.0000 mL | Freq: Once | INTRAVENOUS | Status: AC
  Administered 2022-06-27: 1000 mL via INTRAVENOUS

## 2022-06-27 NOTE — ED Triage Notes (Signed)
Pt to ED via POV c/o headache and chills. Pt states that she was seen recently and was diagnosed with an URI and ear infection. Pt states that she has been short of breath as well. Pt reports that she has hx/o low Hgb and has been on her period for a month. Pt concerned this may be what is causing her shortness of breath. Pt is in NAD at this time. VSS.

## 2022-06-27 NOTE — ED Notes (Signed)
First Nurse Note: Pt to ED via POV for headache and chills. Pt is in NAD.

## 2022-06-27 NOTE — ED Provider Notes (Signed)
[facility] Provider Note    Event Date/Time   First MD Initiated Contact with Patient 06/27/22 [phone]     (approximate)   History   Headache, Chills, and Shortness of Breath   HPI  Faith Ellis is a 36 y.o. female presents to the ER for evaluation headache chills some shortness of breath.  Does not typically get headaches no history of migraines no numbness or tingling.  Having some nausea no abdominal pain no dysuria.  Was recently treated for otitis feels like she just has not gotten better.     Physical Exam   Triage Vital Signs: ED Triage Vitals  Enc Vitals Group     BP 06/27/22 0934 128/79     Pulse Rate 06/27/22 0934 77     Resp 06/27/22 0934 16     Temp 06/27/22 0934 97.9 F (36.6 C)     Temp Source 06/27/22 0934 Oral     SpO2 06/27/22 0934 97 %     Weight 06/27/22 0935 170 lb (77.1 kg)     Height 06/27/22 0935 '5\' 3"'$  (1.6 m)     Head Circumference --      Peak Flow --      Pain Score 06/27/22 0935 7     Pain Loc --      Pain Edu? --      Excl. in [place]? --     Most recent vital signs: Vitals:   06/27/22 0934  BP: 128/79  Pulse: 77  Resp: 16  Temp: 97.9 F (36.6 C)  SpO2: 97%     Constitutional: Alert  Eyes: Conjunctivae are normal.  Head: Atraumatic. Nose: No congestion/rhinnorhea. Mouth/Throat: Mucous membranes are moist.   Neck: Painless ROM.  Cardiovascular:   Good peripheral circulation. Respiratory: Normal respiratory effort.  No retractions.  Gastrointestinal: Soft and nontender.  Musculoskeletal:  no deformity Neurologic:  MAE spontaneously. No gross focal neurologic deficits are appreciated.  Skin:  Skin is warm, dry and intact. No rash noted. Psychiatric: Mood and affect are normal. Speech and behavior are normal.    ED Results / Procedures / Treatments   Labs (all labs ordered are listed, but only abnormal results are displayed) Labs Reviewed  CBC WITH DIFFERENTIAL/PLATELET - Abnormal; Notable for  the following components:      Result Value   Hemoglobin 10.6 (*)    MCV 73.4 (*)    MCH 21.5 (*)    MCHC 29.4 (*)    RDW 17.5 (*)    All other components within normal limits  BASIC METABOLIC PANEL - Abnormal; Notable for the following components:   CO2 21 (*)    Glucose, Bld 148 (*)    All other components within normal limits  SARS CORONAVIRUS 2 BY RT PCR  D-DIMER, QUANTITATIVE     EKG  ED ECG REPORT I, Merlyn Lot, the attending physician, personally viewed and interpreted this ECG.   Date: 06/27/2022  EKG Time: 9:38  Rate: 70  Rhythm: sinus  Axis: normal  Intervals: normal qt  ST&T Change: no stemi, no depressions   RADIOLOGY Please see ED Course for my review and interpretation.  I personally reviewed all radiographic images ordered to evaluate for the above acute complaints and reviewed radiology reports and findings.  These findings were personally discussed with the patient.  Please see medical record for radiology report.    PROCEDURES:  Critical Care performed:   Procedures   MEDICATIONS ORDERED IN ED: Medications  prochlorperazine (COMPAZINE) injection 10 mg (10 mg Intravenous Given 06/27/22 1012)  diphenhydrAMINE (BENADRYL) injection 12.5 mg (12.5 mg Intravenous Given 06/27/22 1012)  sodium chloride 0.9 % bolus 1,000 mL (1,000 mLs Intravenous New Bag/Given 06/27/22 1018)  acetaminophen (TYLENOL) tablet 1,000 mg (1,000 mg Oral Given 06/27/22 1217)     IMPRESSION / MDM / ASSESSMENT AND PLAN / ED COURSE  I reviewed the triage vital signs and the nursing notes.                              Differential diagnosis includes, but is not limited to, URI, COVID, pneumonia, bronchitis, PE, otitis, sinusitis, dehydration, migraine, tension  Patient presenting to the ER for evaluation of symptoms as described above.  Base on symptoms, risk factors and considered above differential, this presenting complaint could reflect a potentially life-threatening  illness therefore the patient will be placed on continuous pulse oximetry and telemetry for monitoring.  Laboratory evaluation will be sent to evaluate for the above complaints.      Clinical Course as of 06/27/22 1326  Fri Jun 27, 2022  1003 Chest x-ray on my review and interpretation without any evidence of pneumothorax or consolidation. [PR]  1638 Patient's D-dimer is negative.  Is not consistent with PE. [PR]  1114 Patient currently resting.  CT imaging without acute abnormality.  Will observe and reassess. [PR]  1325 Patient reassessed feels significantly improved.  Afebrile hemodynamically stable and neuro intact was able to ambulate to the bathroom.  This point given her reassuring work-up and symptomatic improvement I think she is appropriate for outpatient follow-up. [PR]    Clinical Course User Index [PR] Merlyn Lot, MD    FINAL CLINICAL IMPRESSION(S) / ED DIAGNOSES   Final diagnoses:  Bad headache     Rx / DC Orders   ED Discharge Orders     None        Note:  This document was prepared using Dragon voice recognition software and may include unintentional dictation errors.    Merlyn Lot, MD 06/27/22 1326

## 2022-06-27 NOTE — Discharge Instructions (Signed)

## 2023-02-24 ENCOUNTER — Other Ambulatory Visit: Payer: Self-pay

## 2023-02-24 ENCOUNTER — Emergency Department
Admission: EM | Admit: 2023-02-24 | Discharge: 2023-02-24 | Disposition: A | Discharge status: Home / Self Care | Payer: Self-pay | Attending: Emergency Medicine | Admitting: Emergency Medicine

## 2023-02-24 DIAGNOSIS — M545 Low back pain, unspecified: Secondary | ICD-10-CM | POA: Insufficient documentation

## 2023-02-24 DIAGNOSIS — D649 Anemia, unspecified: Secondary | ICD-10-CM | POA: Insufficient documentation

## 2023-02-24 DIAGNOSIS — R8289 Other abnormal findings on cytological and histological examination of urine: Secondary | ICD-10-CM | POA: Insufficient documentation

## 2023-02-24 LAB — URINALYSIS, ROUTINE W REFLEX MICROSCOPIC
Bilirubin Urine: NEGATIVE
Glucose, UA: NEGATIVE mg/dL
Ketones, ur: NEGATIVE mg/dL
Leukocytes,Ua: NEGATIVE
Nitrite: NEGATIVE
Protein, ur: NEGATIVE mg/dL
Specific Gravity, Urine: 1.013 (ref 1.005–1.030)
pH: 6 (ref 5.0–8.0)

## 2023-02-24 LAB — CBC WITH DIFFERENTIAL/PLATELET
Abs Immature Granulocytes: 0.02 10*3/uL (ref 0.00–0.07)
Basophils Absolute: 0 10*3/uL (ref 0.0–0.1)
Basophils Relative: 1 %
Eosinophils Absolute: 0.1 10*3/uL (ref 0.0–0.5)
Eosinophils Relative: 2 %
HCT: 32.7 % — ABNORMAL LOW (ref 36.0–46.0)
Hemoglobin: 9.5 g/dL — ABNORMAL LOW (ref 12.0–15.0)
Immature Granulocytes: 0 %
Lymphocytes Relative: 24 %
Lymphs Abs: 1.5 10*3/uL (ref 0.7–4.0)
MCH: 20.7 pg — ABNORMAL LOW (ref 26.0–34.0)
MCHC: 29.1 g/dL — ABNORMAL LOW (ref 30.0–36.0)
MCV: 71.4 fL — ABNORMAL LOW (ref 80.0–100.0)
Monocytes Absolute: 0.4 10*3/uL (ref 0.1–1.0)
Monocytes Relative: 6 %
Neutro Abs: 4.2 10*3/uL (ref 1.7–7.7)
Neutrophils Relative %: 67 %
Platelets: 238 10*3/uL (ref 150–400)
RBC: 4.58 MIL/uL (ref 3.87–5.11)
RDW: 18 % — ABNORMAL HIGH (ref 11.5–15.5)
WBC: 6.3 10*3/uL (ref 4.0–10.5)
nRBC: 0 % (ref 0.0–0.2)

## 2023-02-24 LAB — COMPREHENSIVE METABOLIC PANEL
ALT: 15 U/L (ref 0–44)
AST: 21 U/L (ref 15–41)
Albumin: 4.1 g/dL (ref 3.5–5.0)
Alkaline Phosphatase: 73 U/L (ref 38–126)
Anion gap: 6 (ref 5–15)
BUN: 9 mg/dL (ref 6–20)
CO2: 24 mmol/L (ref 22–32)
Calcium: 9 mg/dL (ref 8.9–10.3)
Chloride: 108 mmol/L (ref 98–111)
Creatinine, Ser: 0.56 mg/dL (ref 0.44–1.00)
GFR, Estimated: >60 mL/min (ref >60)
Glucose, Bld: 89 mg/dL (ref 70–99)
Potassium: 3.8 mmol/L (ref 3.5–5.1)
Sodium: 138 mmol/L (ref 135–145)
Total Bilirubin: 0.5 mg/dL (ref 0.3–1.2)
Total Protein: 7 g/dL (ref 6.5–8.1)

## 2023-02-24 MED ORDER — MELOXICAM 15 MG PO TABS: 15.0000 mg | ORAL_TABLET | Freq: Every day | ORAL | 2 refills | Status: DC

## 2023-02-24 MED ORDER — CYCLOBENZAPRINE HCL 10 MG PO TABS: 10.0000 mg | ORAL_TABLET | Freq: Three times a day (TID) | ORAL | 0 refills | Status: DC | PRN

## 2023-02-24 NOTE — ED Notes (Signed)
Pt denies any injury that she is aware of, pt states that she is having lower back pain and some pain that radiates into her left outer leg

## 2023-02-24 NOTE — ED Triage Notes (Signed)
Pt presents to ED with c/o of lower back pain, pt states pain started a few days ago but is worse today. Pt states pain is better when when stretching. NAD noted.

## 2023-02-24 NOTE — ED Provider Notes (Signed)
[facility] Provider Note    Event Date/Time   First MD Initiated Contact with Patient 02/24/23 [phone]     (approximate)   History   Back Pain   HPI  Faith Ellis is a 37 y.o. female with history of anemia and as listed in EMR presents to the emergency department for treatment and evaluation of lower back pain x 3-4 days without injury. Pain is worse today. She gets some relief when she stretches forward. Similar symptoms with previous UTI. She is also concerned that her blood count is low due to heavy menses and history of anemia. She requests labs.       Physical Exam   Triage Vital Signs: ED Triage Vitals  Enc Vitals Group     BP 02/24/23 0932 98/77     Pulse Rate 02/24/23 0932 64     Resp 02/24/23 0932 17     Temp 02/24/23 0932 98.4 F (36.9 C)     Temp Source 02/24/23 0932 Oral     SpO2 02/24/23 0932 98 %     Weight 02/24/23 1044 169 lb 15.6 oz (77.1 kg)     Height 02/24/23 1044 5\' 3"  (1.6 m)     Head Circumference --      Peak Flow --      Pain Score 02/24/23 0933 6     Pain Loc --      Pain Edu? --      Excl. in GC? --     Most recent vital signs: Vitals:   02/24/23 0932 02/24/23 1210  BP: 98/77 100/70  Pulse: 64 60  Resp: 17 16  Temp: 98.4 F (36.9 C)   SpO2: 98% 98%    General: Awake, no distress.  CV:  Good peripheral perfusion.  Resp:  Normal effort.  Abd:  No distention.  Other:  No focal midline tenderness of the lower back.   ED Results / Procedures / Treatments   Labs (all labs ordered are listed, but only abnormal results are displayed) Labs Reviewed  CBC WITH DIFFERENTIAL/PLATELET - Abnormal; Notable for the following components:      Result Value   Hemoglobin 9.5 (*)    HCT 32.7 (*)    MCV 71.4 (*)    MCH 20.7 (*)    MCHC 29.1 (*)    RDW 18.0 (*)    All other components within normal limits  URINALYSIS, ROUTINE W REFLEX MICROSCOPIC - Abnormal; Notable for the following components:   Color, Urine  YELLOW (*)    APPearance CLEAR (*)    Hgb urine dipstick SMALL (*)    Bacteria, UA RARE (*)    All other components within normal limits  COMPREHENSIVE METABOLIC PANEL     EKG  Not indicated.   RADIOLOGY  Image and radiology report reviewed and interpreted by me. Radiology report consistent with the same.  Not indicated.  PROCEDURES:  Critical Care performed: No  Procedures   MEDICATIONS ORDERED IN ED:  Medications - No data to display   IMPRESSION / MDM / ASSESSMENT AND PLAN / ED COURSE   I have reviewed the triage note.  Differential diagnosis includes, but is not limited to, lumbar strain, acute cystitis, pyelonephritis.   Patient's presentation is most consistent with acute complicated illness / injury requiring diagnostic workup.  37 year old female presenting to the ER for evaluation of back pain and anemia. See HPI.  Urinalysis shows rare bacteria and small amount of hemoglobin,  however; she is not symptomatic for UTi. WBC is normal. Hgb down to 9.5 which appears to be lower than her baseline. CMP is normal.  Back pain likely secondary to musculoskeletal source. She will be treated with meloxicam and flexeril. She was made aware of the Hgb level and strongly advised to follow up with GYN for management of heavy menses.   ER return precautions discussed.        FINAL CLINICAL IMPRESSION(S) / ED DIAGNOSES   Final diagnoses:  Anemia, unspecified type  Acute bilateral low back pain without sciatica     Rx / DC Orders   ED Discharge Orders          Ordered    meloxicam (MOBIC) 15 MG tablet  Daily        02/24/23 1205    cyclobenzaprine (FLEXERIL) 10 MG tablet  3 times daily PRN        02/24/23 1205             Note:  This document was prepared using Dragon voice recognition software and may include unintentional dictation errors.   Chinita Pester, FNP 02/25/23 0945    Pilar Jarvis, MD 02/25/23 [phone]

## 2023-03-23 ENCOUNTER — Other Ambulatory Visit: Payer: Self-pay

## 2023-03-23 ENCOUNTER — Emergency Department
Admission: EM | Admit: 2023-03-23 | Discharge: 2023-03-23 | Disposition: A | Discharge status: Home / Self Care | Payer: Self-pay | Attending: Emergency Medicine | Admitting: Emergency Medicine

## 2023-03-23 ENCOUNTER — Encounter: Payer: Self-pay | Admitting: *Deleted

## 2023-03-23 DIAGNOSIS — K0889 Other specified disorders of teeth and supporting structures: Secondary | ICD-10-CM | POA: Insufficient documentation

## 2023-03-23 DIAGNOSIS — R519 Headache, unspecified: Secondary | ICD-10-CM | POA: Insufficient documentation

## 2023-03-23 MED ORDER — CLINDAMYCIN HCL 300 MG PO CAPS: 300.0000 mg | ORAL_CAPSULE | Freq: Three times a day (TID) | ORAL | 0 refills | Status: AC

## 2023-03-23 MED ORDER — CLINDAMYCIN HCL 150 MG PO CAPS
300.0000 mg | ORAL_CAPSULE | Freq: Once | ORAL | Status: AC
  Administered 2023-03-23: 300 mg via ORAL
  Filled 2023-03-23: qty 2

## 2023-03-23 NOTE — ED Triage Notes (Addendum)
Pt reports left side headache for 2 days.  Pt denies n/v  pt taking otc meds without relief.  Pt alert  speech clear.  Pt also has left upper tooth pain with swelling on left side of face.   Pt also reports itching of left ear.

## 2023-03-23 NOTE — ED Provider Notes (Signed)
   [facility] Provider Note    Event Date/Time   First MD Initiated Contact with Patient 03/23/23 2041     (approximate)  History   Chief Complaint: Headache and Dental Pain  HPI  Faith Ellis is a 37 y.o. female with a past medical history of anemia, chronic headaches, presents to the emergency department for left-sided facial pain.  According to the patient for the past 2 days she has been experiencing mild headache as well as itching in her left ear.  She also states she feels like it hurts in her left upper tooth when she clenches her jaw.  Patient denies any fever.  Denies any cough congestion or sore throat.  Physical Exam   Triage Vital Signs: ED Triage Vitals [03/23/23 1809]  Enc Vitals Group     BP 127/74     Pulse Rate 81     Resp 18     Temp 98.4 F (36.9 C)     Temp Source Oral     SpO2 100 %     Weight 163 lb (73.9 kg)     Height 5\' 3"  (1.6 m)     Head Circumference      Peak Flow      Pain Score 4     Pain Loc      Pain Edu?      Excl. in GC?     Most recent vital signs: Vitals:   03/23/23 1809  BP: 127/74  Pulse: 81  Resp: 18  Temp: 98.4 F (36.9 C)  SpO2: 100%    General: Awake, no distress.  CV:  Good peripheral perfusion.  Regular rate and rhythm  Resp:  Normal effort.  Equal breath sounds bilaterally.  Abd:  No distention.  Soft, nontender.  No rebound or guarding. Other:  No facial swelling noted on my exam.  Patient does have mild tenderness in the left upper molars with jaw clenching although no abscess palpated or visualized on my exam.  Normal left tympanic membrane with no bulging or erythema of the tympanic membrane or external auditory canal.  No cervical lymphadenopathy palpated.   ED Results / Procedures / Treatments   MEDICATIONS ORDERED IN ED: Medications - No data to display   IMPRESSION / MDM / ASSESSMENT AND PLAN / ED COURSE  I reviewed the triage vital signs and the nursing  notes.  Patient's presentation is most consistent with acute presentation with potential threat to life or bodily function.  Patient presents emergency department for left facial pain mostly with jaw clenching per patient.  States she is having pain in the left upper molar area.  There is no tenderness to palpation of this area there is no abscess palpated along the gums no sign of any obvious dental carry.  Tympanic membrane is normal.  Given the patient's symptoms especially worse with jaw clenching we will cover with antibiotics as a precaution.  Patient has a cephalosporin allergy we will discharge on clindamycin and have the patient follow-up with her doctor.  Patient agreeable to plan of care.  Discussed return precautions.  FINAL CLINICAL IMPRESSION(S) / ED DIAGNOSES   Dental pain  Rx / DC Orders   Clindamycin  Note:  This document was prepared using Dragon voice recognition software and may include unintentional dictation errors.   Minna Antis, MD 03/23/23 2052

## 2023-03-23 NOTE — Discharge Instructions (Signed)
As discussed please take your antibiotic for its entire course.  Return to the emergency department for any significant worsening of pain, fever, or any other symptom personally concerning to yourself.  Otherwise please follow-up with your doctor in 2 to 3 days for recheck/reevaluation.

## 2023-04-06 ENCOUNTER — Encounter: Payer: Self-pay | Admitting: Emergency Medicine

## 2023-04-06 ENCOUNTER — Emergency Department: Admission: EM | Admit: 2023-04-06 | Payer: Medicaid Other | Source: Home / Self Care

## 2023-04-06 ENCOUNTER — Other Ambulatory Visit: Payer: Self-pay

## 2023-04-06 DIAGNOSIS — N920 Excessive and frequent menstruation with regular cycle: Secondary | ICD-10-CM | POA: Insufficient documentation

## 2023-04-06 LAB — CBC WITH DIFFERENTIAL/PLATELET
Abs Immature Granulocytes: 0.01 10*3/uL (ref 0.00–0.07)
Basophils Absolute: 0.1 10*3/uL (ref 0.0–0.1)
Basophils Relative: 1 %
Eosinophils Absolute: 0.2 10*3/uL (ref 0.0–0.5)
Eosinophils Relative: 3 %
HCT: 34 % — ABNORMAL LOW (ref 36.0–46.0)
Hemoglobin: 10 g/dL — ABNORMAL LOW (ref 12.0–15.0)
Immature Granulocytes: 0 %
Lymphocytes Relative: 33 %
Lymphs Abs: 2.2 10*3/uL (ref 0.7–4.0)
MCH: 20.7 pg — ABNORMAL LOW (ref 26.0–34.0)
MCHC: 29.4 g/dL — ABNORMAL LOW (ref 30.0–36.0)
MCV: 70.4 fL — ABNORMAL LOW (ref 80.0–100.0)
Monocytes Absolute: 0.5 10*3/uL (ref 0.1–1.0)
Monocytes Relative: 7 %
Neutro Abs: 3.9 10*3/uL (ref 1.7–7.7)
Neutrophils Relative %: 56 %
Platelets: 256 10*3/uL (ref 150–400)
RBC: 4.83 MIL/uL (ref 3.87–5.11)
RDW: 19.3 % — ABNORMAL HIGH (ref 11.5–15.5)
WBC: 6.8 10*3/uL (ref 4.0–10.5)
nRBC: 0 % (ref 0.0–0.2)

## 2023-04-06 LAB — COMPREHENSIVE METABOLIC PANEL
ALT: 16 U/L (ref 0–44)
AST: 21 U/L (ref 15–41)
Albumin: 4.3 g/dL (ref 3.5–5.0)
Alkaline Phosphatase: 73 U/L (ref 38–126)
Anion gap: 9 (ref 5–15)
BUN: 7 mg/dL (ref 6–20)
CO2: 23 mmol/L (ref 22–32)
Calcium: 9.4 mg/dL (ref 8.9–10.3)
Chloride: 106 mmol/L (ref 98–111)
Creatinine, Ser: 0.66 mg/dL (ref 0.44–1.00)
GFR, Estimated: >60 mL/min (ref >60)
Glucose, Bld: 103 mg/dL — ABNORMAL HIGH (ref 70–99)
Potassium: 3.2 mmol/L — ABNORMAL LOW (ref 3.5–5.1)
Sodium: 138 mmol/L (ref 135–145)
Total Bilirubin: 0.4 mg/dL (ref 0.3–1.2)
Total Protein: 6.9 g/dL (ref 6.5–8.1)

## 2023-04-06 NOTE — ED Triage Notes (Signed)
Pt presents ambulatory to triage via POV with complaints of vaginal bleeding x 1 day. Pt notes starting her period this AM and has had heavy bleeding. She notes a hx of anemia with an average hgb of 9.0. She has mild cramping associated with her period. A&Ox4 at this time. Denies dizziness, CP or SOB.

## 2023-04-07 DIAGNOSIS — N920 Excessive and frequent menstruation with regular cycle: Secondary | ICD-10-CM

## 2023-04-07 NOTE — ED Provider Notes (Signed)
[facility] Provider Note    Event Date/Time   First MD Initiated Contact with Patient 04/07/23 0321     (approximate)   History   Vaginal Bleeding   HPI  Faith Ellis is a 37 y.o. female who presents to the ED for evaluation of Vaginal Bleeding   Patient presents for evaluation of known heavy vaginal bleeding.  She reports regular cycles with very heavy periods and this "is a bad 1."  She reports heavy menstruation and pelvic cramping without novel features beyond the heavy flow.  Reports that she has had to have a blood transfusion 1 time in the past.  Known fibroids.   Physical Exam   Triage Vital Signs: ED Triage Vitals  Enc Vitals Group     BP 04/06/23 2320 (!) 143/79     Pulse Rate 04/06/23 2320 86     Resp 04/06/23 2320 18     Temp 04/06/23 2320 98.4 F (36.9 C)     Temp Source 04/06/23 2320 Oral     SpO2 04/06/23 2320 100 %     Weight 04/06/23 2318 160 lb (72.6 kg)     Height 04/06/23 2318 5\' 3"  (1.6 m)     Head Circumference --      Peak Flow --      Pain Score 04/06/23 2318 0     Pain Loc --      Pain Edu? --      Excl. in GC? --     Most recent vital signs: Vitals:   04/06/23 2320  BP: (!) 143/79  Pulse: 86  Resp: 18  Temp: 98.4 F (36.9 C)  SpO2: 100%    General: Awake, no distress.  CV:  Good peripheral perfusion.  Resp:  Normal effort.  Abd:  No distention.  Soft MSK:  No deformity noted.  Neuro:  No focal deficits appreciated. Other:     ED Results / Procedures / Treatments   Labs (all labs ordered are listed, but only abnormal results are displayed) Labs Reviewed  CBC WITH DIFFERENTIAL/PLATELET - Abnormal; Notable for the following components:      Result Value   Hemoglobin 10.0 (*)    HCT 34.0 (*)    MCV 70.4 (*)    MCH 20.7 (*)    MCHC 29.4 (*)    RDW 19.3 (*)    All other components within normal limits  COMPREHENSIVE METABOLIC PANEL - Abnormal; Notable for the following components:    Potassium 3.2 (*)    Glucose, Bld 103 (*)    All other components within normal limits  POC URINE PREG, ED    EKG   RADIOLOGY   Official radiology report(s): No results found.  PROCEDURES and INTERVENTIONS:  Procedures  Medications - No data to display   IMPRESSION / MDM / ASSESSMENT AND PLAN / ED COURSE  I reviewed the triage vital signs and the nursing notes.  Differential diagnosis includes, but is not limited to, menorrhagia, blood loss anemia or symptomatic anemia, pregnancy  {Patient presents with symptoms of an acute illness or injury that is potentially life-threatening.  Patient presents with regular menorrhagia with stable hemoglobin suitable for outpatient management.  Hemoglobin improved from previous comparison.  Reassuring renal function.  I recommended a pregnancy test but she refuses indicating she has tubal ligation and there is no chance of pregnancy.  Offered Toradol but she declines.  Discussed management at home and return precautions .  Reports she just  wanted her blood counts checked      FINAL CLINICAL IMPRESSION(S) / ED DIAGNOSES   Final diagnoses:  Menorrhagia with regular cycle     Rx / DC Orders   ED Discharge Orders     None        Note:  This document was prepared using Dragon voice recognition software and may include unintentional dictation errors.   Delton Prairie, MD 04/07/23 [phone]

## 2023-04-07 NOTE — Discharge Instructions (Addendum)
Please take Tylenol and ibuprofen/Advil for your pain.  It is safe to take them together, or to alternate them every few hours.  Take up to 1000mg of Tylenol at a time, up to 4 times per day.  Do not take more than 4000 mg of Tylenol in 24 hours.  For ibuprofen, take 400-600 mg, 3 - 4 times per day.  

## 2023-05-17 ENCOUNTER — Emergency Department
Admission: EM | Admit: 2023-05-17 | Discharge: 2023-05-18 | Discharge status: Against Medical Advice | Payer: Medicaid Other | Attending: Emergency Medicine | Admitting: Emergency Medicine

## 2023-05-17 ENCOUNTER — Other Ambulatory Visit: Payer: Self-pay

## 2023-05-17 DIAGNOSIS — Z5321 Procedure and treatment not carried out due to patient leaving prior to being seen by health care provider: Secondary | ICD-10-CM | POA: Diagnosis not present

## 2023-05-17 DIAGNOSIS — W57XXXA Bitten or stung by nonvenomous insect and other nonvenomous arthropods, initial encounter: Secondary | ICD-10-CM | POA: Insufficient documentation

## 2023-05-17 DIAGNOSIS — S40862A Insect bite (nonvenomous) of left upper arm, initial encounter: Secondary | ICD-10-CM | POA: Diagnosis present

## 2023-05-17 NOTE — ED Triage Notes (Signed)
Pt states she felt like she got bit by a bug earlier tonight on her upper left arm, pt has small red mark on arm pt also states her upper arm has a burning sensation. Pt denies difficulty breathing or swallowing.

## 2023-05-18 NOTE — ED Notes (Signed)
Pt let registration know they were leaving.

## 2023-05-27 ENCOUNTER — Emergency Department
Admission: EM | Admit: 2023-05-27 | Discharge: 2023-05-27 | Disposition: A | Discharge status: Home / Self Care | Payer: Medicaid Other | Attending: Emergency Medicine | Admitting: Emergency Medicine

## 2023-05-27 ENCOUNTER — Other Ambulatory Visit: Payer: Self-pay

## 2023-05-27 DIAGNOSIS — J069 Acute upper respiratory infection, unspecified: Secondary | ICD-10-CM | POA: Insufficient documentation

## 2023-05-27 DIAGNOSIS — Z20822 Contact with and (suspected) exposure to covid-19: Secondary | ICD-10-CM | POA: Diagnosis not present

## 2023-05-27 DIAGNOSIS — R0981 Nasal congestion: Secondary | ICD-10-CM | POA: Diagnosis present

## 2023-05-27 DIAGNOSIS — R519 Headache, unspecified: Secondary | ICD-10-CM | POA: Insufficient documentation

## 2023-05-27 LAB — SARS CORONAVIRUS 2 BY RT PCR: SARS Coronavirus 2 by RT PCR: NEGATIVE

## 2023-05-27 MED ORDER — PSEUDOEPHEDRINE HCL 60 MG PO TABS: 60.0000 mg | ORAL_TABLET | Freq: Three times a day (TID) | ORAL | 0 refills | Status: DC | PRN

## 2023-05-27 NOTE — ED Provider Notes (Signed)
[facility] Emergency Department Provider Note     Event Date/Time   First MD Initiated Contact with Patient 05/27/23 1507     (approximate)   History   Nasal Congestion   HPI  Faith Ellis is a 37 y.o. female to the ED with complaint of nasal congestion since last night.  Patient endorses sick contacts.  Patient reports COVID exposure with family members.  Associated symptoms include headache.  Patient has tried Tylenol with minimal relief.  Denies fever, vomiting, and diarrhea.  No cough.    Physical Exam   Triage Vital Signs: ED Triage Vitals  Encounter Vitals Group     BP 05/27/23 1416 128/71     Systolic BP Percentile --      Diastolic BP Percentile --      Pulse Rate 05/27/23 1416 82     Resp 05/27/23 1416 16     Temp 05/27/23 1416 98.6 F (37 C)     Temp Source 05/27/23 1416 Oral     SpO2 05/27/23 1416 100 %     Weight 05/27/23 1417 163 lb (73.9 kg)     Height 05/27/23 1417 5\' 3"  (1.6 m)     Head Circumference --      Peak Flow --      Pain Score 05/27/23 1417 0     Pain Loc --      Pain Education --      Exclude from Growth Chart --     Most recent vital signs: Vitals:   05/27/23 1416  BP: 128/71  Pulse: 82  Resp: 16  Temp: 98.6 F (37 C)  SpO2: 100%    General Well appearing.  Awake, no distress.  Non toxic. HEENT NCAT. PERRL. EOMI. No rhinorrhea. Mucous membranes are moist.  Nontender sinus with percussion.  Oropharynx is clear.  Mildly erythemic.  Will is midline.  No exudates.  Left ear: Small amount of fluid behind TM.  No bulging or erythemic EAC. CV:  Good peripheral perfusion.  RRR RESP:  Normal effort.  LCTAB.  No wheezes or rhonchi. ABD:  No distention.   ED Results / Procedures / Treatments   Labs (all labs ordered are listed, but only abnormal results are displayed) Labs Reviewed  SARS CORONAVIRUS 2 BY RT PCR   No results found.  PROCEDURES:  Critical Care performed:  No  Procedures  MEDICATIONS ORDERED IN ED: Medications - No data to display   IMPRESSION / MDM / ASSESSMENT AND PLAN / ED COURSE  I reviewed the triage vital signs and the nursing notes.                              Clinical Course as of 05/27/23 1631  Wed May 27, 2023  1511 Yesterday nose and throat. Tylenol. Sick contacts. Covid. Vomiting diarrhea.   Decongestant.  [MH]    Clinical Course User Index [MH] Kern Reap A, PA-C    37 y.o. female presents to the emergency department for evaluation and treatment of acute nasal congestion and sore throat. See HPI for further details. Vital signs and physical exam are pertinent for serous fluid behind left ear and mildly erythemic oropharynx.   Differential diagnosis includes, but is not limited to pneumonia, sinusitis, COVID, viral URI.  Patient's presentation is most consistent with acute complicated illness / injury requiring diagnostic workup.  Patient's presentation is clinically consistent with a viral upper  respiratory infection given sudden onset and physical exam findings stated above.  Lung exam is reassuring, I do not suspect pneumonia at this time.  A decongestion will be prescribed.  Encouraged to follow-up with primary care. Patient is given ED precautions to return to the ED for any worsening or new symptoms. Patient verbalizes understanding. All questions and concerns were addressed during ED visit.     FINAL CLINICAL IMPRESSION(S) / ED DIAGNOSES   Final diagnoses:  Nasal congestion  Viral upper respiratory tract infection    Rx / DC Orders   ED Discharge Orders          Ordered    pseudoephedrine (SUDAFED) 60 MG tablet  Every 8 hours PRN        05/27/23 1534             Note:  This document was prepared using Dragon voice recognition software and may include unintentional dictation errors.    Romeo Apple, Melvie Paglia A, PA-C 05/27/23 1631    Corena Herter, MD 05/28/23 [phone]

## 2023-05-27 NOTE — Discharge Instructions (Addendum)
You were evaluated in the ED today for nasal congestion.  Your COVID test is reassuring.  No detection of COVID at this time.  This is more than likely a viral upper respiratory infection in which she will need to receive plenty of rest and stay hydrated.  Please follow-up with your primary care if symptoms do not improve.  List has been provided for you of primary care providers accepting new patients at this time.  Return to ED if you experience any chest pain or shortness of breath.  Please go to the following website to schedule new (and existing) patient appointments:   [email or web address]   The following is a list of primary care offices in the area who are accepting new patients at this time.  Please reach out to one of them directly and let them know you would like to schedule an appointment to follow up on an Emergency Department visit, and/or to establish a new primary care provider (PCP).  There are likely other primary care clinics in the are who are accepting new patients, but this is an excellent place to start:  [facility] Lead physician: Dr Shirlee Latch [address] [place], [place] [address] [phone]  [facility] Lead Physician: Dr Alba Cory [address], [place], [place] [address] [phone]  [facility]  Lead Physician: Dr Olevia Perches [address] [place], [place] [address] [phone]  [facility] Lead Physician: Dr Sofie Hartigan [address] [place], [place] [address] [phone]  [facility] Primary Care & [facility] Medicine at [facility] Lead Physician: Dr Bari Edward [address] [place], [place], [place] [address] [phone]

## 2023-05-27 NOTE — ED Triage Notes (Signed)
Pt c/o congestion and flu-like symptoms and reports being exposed to Covid. Pt symptoms started last night. Pt denies any medical issues.

## 2023-05-29 ENCOUNTER — Emergency Department
Admission: EM | Admit: 2023-05-29 | Discharge: 2023-05-29 | Disposition: A | Discharge status: Home / Self Care | Payer: Medicaid Other | Attending: Emergency Medicine | Admitting: Emergency Medicine

## 2023-05-29 ENCOUNTER — Other Ambulatory Visit: Payer: Self-pay

## 2023-05-29 DIAGNOSIS — U071 COVID-19: Secondary | ICD-10-CM | POA: Diagnosis not present

## 2023-05-29 DIAGNOSIS — Z20822 Contact with and (suspected) exposure to covid-19: Secondary | ICD-10-CM | POA: Diagnosis present

## 2023-05-29 LAB — RESP PANEL BY RT-PCR (RSV, FLU A&B, COVID)  RVPGX2
Influenza A by PCR: NEGATIVE
Influenza B by PCR: NEGATIVE
Resp Syncytial Virus by PCR: NEGATIVE
SARS Coronavirus 2 by RT PCR: POSITIVE — AB

## 2023-05-29 MED ORDER — FLUTICASONE PROPIONATE 50 MCG/ACT NA SUSP: 1.0000 | Freq: Two times a day (BID) | NASAL | 0 refills | Status: DC

## 2023-05-29 MED ORDER — BENZONATATE 100 MG PO CAPS: 100.0000 mg | ORAL_CAPSULE | Freq: Three times a day (TID) | ORAL | 0 refills | Status: DC | PRN

## 2023-05-29 MED ORDER — PSEUDOEPH-BROMPHEN-DM 30-2-10 MG/5ML PO SYRP: 10.0000 mL | ORAL_SOLUTION | Freq: Four times a day (QID) | ORAL | 0 refills | Status: DC | PRN

## 2023-05-29 NOTE — ED Triage Notes (Addendum)
Pt c/o body aches, nasal drainage, headache x2 days. Pt states she was tested for covid yesterday but it was negative- was told it might be too early. Pt  exposed to flu/covid at work. Pt Aox4, NAD noted, respirations even and unlabored, lung sounds clear bilaterally. Pt reports dry infrequent cough, denies N/V/D.

## 2023-05-29 NOTE — ED Provider Notes (Signed)
[facility] Provider Note  Patient Contact: 6:55 PM (approximate)   History   URI   HPI  Faith Ellis is a 37 y.o. female who presents the emergency department for COVID testing.  Patient states that she was seen 2 days ago when she started to have symptoms.  She presented so close to her 1 set of symptoms that her test was negative.  Patient states that she took a home COVID test and it was determined that she had COVID at home.  When she called to let her work know they would not take home COVID test and told her that she had to be seen so that she could have a work note to be excused from work.  Patient has viral symptoms without any chest pain, shortness of breath, abdominal pain.     Physical Exam   Triage Vital Signs: ED Triage Vitals  Encounter Vitals Group     BP 05/29/23 1733 122/60     Systolic BP Percentile --      Diastolic BP Percentile --      Pulse Rate 05/29/23 1733 84     Resp 05/29/23 1733 16     Temp 05/29/23 1733 97.7 F (36.5 C)     Temp Source 05/29/23 1733 Oral     SpO2 05/29/23 1733 100 %     Weight --      Height --      Head Circumference --      Peak Flow --      Pain Score 05/29/23 1735 4     Pain Loc --      Pain Education --      Exclude from Growth Chart --     Most recent vital signs: Vitals:   05/29/23 1733  BP: 122/60  Pulse: 84  Resp: 16  Temp: 97.7 F (36.5 C)  SpO2: 100%     General: Alert and in no acute distress. ENT:      Ears:       Nose: Positive congestion/rhinnorhea.      Mouth/Throat: Mucous membranes are moist. Neck: No stridor.  Cardiovascular:  Good peripheral perfusion Respiratory: Normal respiratory effort without tachypnea or retractions. Lungs CTAB. Good air entry to the bases with no decreased or absent breath sounds. Musculoskeletal: Full range of motion to all extremities.  Neurologic:  No gross focal neurologic deficits are appreciated.  Skin:   No rash  noted Other:   ED Results / Procedures / Treatments   Labs (all labs ordered are listed, but only abnormal results are displayed) Labs Reviewed  RESP PANEL BY RT-PCR (RSV, FLU A&B, COVID)  RVPGX2 - Abnormal; Notable for the following components:      Result Value   SARS Coronavirus 2 by RT PCR POSITIVE (*)    All other components within normal limits     EKG     RADIOLOGY    No results found.  PROCEDURES:  Critical Care performed: No  Procedures   MEDICATIONS ORDERED IN ED: Medications - No data to display   IMPRESSION / MDM / ASSESSMENT AND PLAN / ED COURSE  I reviewed the triage vital signs and the nursing notes.                                 Differential diagnosis includes, but is not limited to, flu, COVID, viral illness, bronchitis, pneumonia  Patient's presentation is most consistent with acute presentation with potential threat to life or bodily function.   Patient's diagnosis is consistent with COVID-19.  Patient presents emergency department with symptoms consistent with COVID.  Patient had a home positive test which is confirmed today.  Patient is here as she needs confirmation and a work note for her job.  Patient will have symptom control medications.  Follow-up with primary care as needed.  Return precautions discussed with patient at this time..  Patient is given ED precautions to return to the ED for any worsening or new symptoms.     FINAL CLINICAL IMPRESSION(S) / ED DIAGNOSES   Final diagnoses:  COVID-19     Rx / DC Orders   ED Discharge Orders          Ordered    fluticasone (FLONASE) 50 MCG/ACT nasal spray  2 times daily        05/29/23 1935    benzonatate (TESSALON PERLES) 100 MG capsule  3 times daily PRN        05/29/23 1935    brompheniramine-pseudoephedrine-DM 30-2-10 MG/5ML syrup  4 times daily PRN        05/29/23 1935             Note:  This document was prepared using Dragon voice recognition software and  may include unintentional dictation errors.   Lanette Hampshire 05/29/23 1936    Dionne Bucy, MD 05/30/23 [phone]

## 2023-08-06 ENCOUNTER — Other Ambulatory Visit: Payer: Self-pay

## 2023-08-06 ENCOUNTER — Encounter: Payer: Self-pay | Admitting: *Deleted

## 2023-08-06 ENCOUNTER — Emergency Department: Payer: Medicaid Other

## 2023-08-06 DIAGNOSIS — W208XXA Other cause of strike by thrown, projected or falling object, initial encounter: Secondary | ICD-10-CM | POA: Insufficient documentation

## 2023-08-06 DIAGNOSIS — M79671 Pain in right foot: Secondary | ICD-10-CM | POA: Diagnosis present

## 2023-08-06 DIAGNOSIS — S99921A Unspecified injury of right foot, initial encounter: Secondary | ICD-10-CM | POA: Diagnosis not present

## 2023-08-06 DIAGNOSIS — Z5321 Procedure and treatment not carried out due to patient leaving prior to being seen by health care provider: Secondary | ICD-10-CM | POA: Diagnosis not present

## 2023-08-06 NOTE — ED Triage Notes (Signed)
Pt ambulatory to triage.  Pt has right foot pain after dropping a marble top onto foot today   pt alert  speech clear.

## 2023-08-07 ENCOUNTER — Emergency Department
Admission: EM | Admit: 2023-08-07 | Discharge: 2023-08-07 | Discharge status: Against Medical Advice | Payer: Medicaid Other | Attending: Emergency Medicine | Admitting: Emergency Medicine

## 2023-08-07 NOTE — ED Notes (Signed)
No answer when called several times from lobby 

## 2023-08-30 DIAGNOSIS — J189 Pneumonia, unspecified organism: Secondary | ICD-10-CM

## 2023-08-30 HISTORY — DX: Pneumonia, unspecified organism: J18.9

## 2023-09-16 ENCOUNTER — Other Ambulatory Visit: Payer: Self-pay | Admitting: Obstetrics and Gynecology

## 2023-10-01 NOTE — H&P (Signed)
 Preoperative History and Physical  Chief Complaint: Faith  Ellis is a 38 y.o. G4P4 here for surgical management of abnormal uterine bleeding (menorrhagia with irregular cycle) and fibroid uterus.   No significant preoperative concerns.  History of Present Illness: 38 y.o. G4P4 who presents to discuss heavy periods and frequent.  She did try COCs and this did work. She tried  this the last time she went to the ER.  These started giving her bad headaches and she smokes. So, she decided not to take them. She has had a BTL after her last child (10 years ago).  It seems like her periods have been bad since them.  One month she had 3 periods in one month.  She had a pelvic ultrasound on 07/30/2023 that showed two small fibroids.  Her periods aren't regular. She may have a period that lasts for a month.  She has been anemic. She has had to have blood transfusions.  She had three blood transfusions in 2017.  She isn't sure that this was related to her menstrual bleeding.    Last pap smear: 09/16/2023: NILM, HPV negative  Proposed surgery: Robot assisted total laparoscopic hysterectomy, bilateral salpingectomy, cystoscopy  Past Medical History:  Diagnosis Date   Anemia    Rebound headache 2017   Ulcer    Past Surgical History:  Procedure Laterality Date   EGD  02/01/2016   (Inpt) Gastric Ulcer: Repeat in 12 weeks (03/2016); [facility] 02/07/2016 @ 8:30am w/Kim Moishe NP (dw)   EGD  04/25/2016   No repeat per RTE   CESAREAN SECTION  2011, 2014   CHOLECYSTECTOMY     LAPAROSCOPIC TUBAL LIGATION     done during C section- not laparoscopic   TONSILLECTOMY & ADENOIDECTOMY     OB History  Gravida Para Term Preterm AB Living  4 4       4   SAB IAB Ectopic Molar Multiple Live Births                 # Outcome Date GA Lbr Len/2nd Weight Sex Type Anes PTL Lv  4 Para           3 Para           2 Para           1 Para           Patient denies any other pertinent gynecologic issues.   Current Outpatient  Medications on File Prior to Visit  Medication Sig Dispense Refill   omeprazole (PRILOSEC) 10 MG DR capsule Take 10 mg by mouth once daily. (Patient not taking: Reported on 10/01/2023)     No current facility-administered medications on file prior to visit.   Allergies  Allergen Reactions   Keflex [Cephalexin] Rash    Social History:   reports that she has been smoking cigarettes. She does not have any smokeless tobacco history on file. She reports that she does not drink alcohol and does not use drugs.  Family History  Problem Relation Name Age of Onset   Gallbladder disease Mother     Gallbladder disease Sister      Review of Systems: Noncontributory  PHYSICAL EXAM: Blood pressure 103/68, pulse 75, height 160 cm (5' 3), weight 78.5 kg (173 lb), last menstrual period 09/04/2023. CONSTITUTIONAL: Well-developed, well-nourished female in no acute distress.  HENT:  Normocephalic, atraumatic, External right and left ear normal. Oropharynx is clear and moist EYES: Conjunctivae and EOM are normal. Pupils are equal, round, and reactive  to light. No scleral icterus.  NECK: Normal range of motion, supple, no masses SKIN: Skin is warm and dry. No rash noted. Not diaphoretic. No erythema. No pallor. NEUROLGIC: Alert and oriented to person, place, and time. Normal reflexes, muscle tone coordination. No cranial nerve deficit noted. PSYCHIATRIC: Normal mood and affect. Normal behavior. Normal judgment and thought content. CARDIOVASCULAR: Normal heart rate noted, regular rhythm RESPIRATORY: Effort and breath sounds normal, no problems with respiration noted ABDOMEN: Soft, nontender, nondistended. PELVIC: Deferred MUSCULOSKELETAL: Normal range of motion. No edema and no tenderness. 2+ distal pulses.  Labs: No results found for this or any previous visit (from the past 2 weeks).  Imaging Studies (07/30/2023): Endovaginal Imaging ================   Indication: Endovaginal imaging was  necessary to evaluate the uterus and ovaries. Probe: The probe used for this examination was FOCXVR.   Uterus ======   Visualized. Size 96 mm x 60 mm x 49 mm enlarged Position: anteverted Malformations: none Myometrium: Heterogeneous Endometrium: thickened; heterogenous. Endometrial thickness, total 14.6 mm Fibroid(s) Size 19.00 mm x 14.00 mm x 17 mm. Mean 16.7 mm. Vol 2.4 cm. FIGO - 4 (intramural no endometrial or serosal involvement). Right lateral        wall. Posterior   Right Ovary =========   Visualized, Normal. Outline: Normal. Morphology: Appropriate. Size 35 mm x 22 mm x 26 mm   Left Ovary ========   Visualized, Normal. Outline: Normal. Morphology: Appropriate. Size 28 mm x 24 mm x 23 mm   Cul de Sac =========   Normal. No free fluid visualized   Impression =========   Endovaginal ultrasound performed today due to the indications outlined above.   The sonogram reveals a fibroid uterus (measurements above).   The endometrium appears thickened and hetergenous in appearance.   The ovaries were visualized bilaterally and appear normal.   There are no unusual adnexal findings.   There is no free fluid.  Assessment: 1. Menorrhagia with irregular cycle   2. Uterine leiomyoma, unspecified location      Plan: Patient will undergo surgical management with the above-noted surgery.   The risks of surgery were discussed in detail with the patient including but not limited to: bleeding which may require transfusion or reoperation; infection which may require antibiotics; injury to surrounding organs which may involve bowel, bladder, ureters ; need for additional procedures including laparoscopy or laparotomy; thromboembolic phenomenon, surgical site problems and other postoperative/anesthesia complications. Likelihood of success in alleviating the patient's condition was discussed. Routine postoperative instructions will be reviewed with the patient and her family in  detail after surgery.  The patient concurred with the proposed plan, giving informed written consent for the surgery.   Preoperative prophylactic antibiotics, as indicated, and SCDs ordered on call to the OR.    Return on 10/27/2023 for Post-op incision check, Keep previously scheduled appts.   Attestation Statement:   I personally performed the service. (TP)  Jeiden Daughtridge TORIBIO MACE, MD  [facility] 10/01/2023 9:39 AM

## 2023-10-08 ENCOUNTER — Encounter
Admission: RE | Admit: 2023-10-08 | Discharge: 2023-10-08 | Disposition: A | Discharge status: Home / Self Care | Payer: Medicaid Other | Source: Ambulatory Visit | Attending: Obstetrics and Gynecology | Admitting: Obstetrics and Gynecology

## 2023-10-08 VITALS — Ht 63.0 in | Wt 173.1 lb

## 2023-10-08 DIAGNOSIS — E876 Hypokalemia: Secondary | ICD-10-CM

## 2023-10-08 DIAGNOSIS — Z01812 Encounter for preprocedural laboratory examination: Secondary | ICD-10-CM

## 2023-10-08 DIAGNOSIS — Z01818 Encounter for other preprocedural examination: Secondary | ICD-10-CM

## 2023-10-08 HISTORY — DX: Excessive and frequent menstruation with irregular cycle: N92.1

## 2023-10-08 HISTORY — DX: Nicotine dependence, cigarettes, uncomplicated: F17.210

## 2023-10-08 HISTORY — DX: Dyspnea, unspecified: R06.00

## 2023-10-08 HISTORY — DX: Leiomyoma of uterus, unspecified: D25.9

## 2023-10-08 NOTE — Patient Instructions (Signed)
 Your procedure is scheduled on:10-16-23 Friday Report to the Registration Desk on the 1st floor of the [facility].Then proceed to the 2nd floor Surgery Desk To find out your arrival time, please call [phone] between 1PM - 3PM on:10-15-23 Thursday If your arrival time is 6:00 am, do not arrive before that time as the Medical Mall entrance doors do not open until 6:00 am.  REMEMBER: Instructions that are not followed completely may result in serious medical risk, up to and including death; or upon the discretion of your surgeon and anesthesiologist your surgery may need to be rescheduled.  Do not eat food OR drink any liquids after midnight the night before surgery.  No gum chewing or hard candies.  One week prior to surgery:Stop NOW (10-08-23) Stop Anti-inflammatories (NSAIDS) such as Advil , Aleve , Ibuprofen , Motrin , Naproxen , Naprosyn  and Aspirin based products such as Excedrin, Goody's Powder, BC Powder. Stop ANY OVER THE COUNTER supplements until after surgery.  You may however, continue to take Tylenol  if needed for pain up until the day of surgery.  Continue taking all of your other prescription medications up until the day of surgery.  Do NOT take any medication the day of surgery  Bring your Albuterol  Inhaler to the hospital   No Alcohol for 24 hours before or after surgery.  No Smoking including e-cigarettes for 24 hours before surgery.  No chewable tobacco products for at least 6 hours before surgery.  No nicotine  patches on the day of surgery.  Do not use any recreational drugs for at least a week (preferably 2 weeks) before your surgery.  Please be advised that the combination of cocaine and anesthesia may have negative outcomes, up to and including death. If you test positive for cocaine, your surgery will be cancelled.  On the morning of surgery brush your teeth with toothpaste and water, you may rinse your mouth with mouthwash if you wish. Do not swallow any  toothpaste or mouthwash.  Use CHG Soap as directed on instruction sheet.  Do not wear jewelry, make-up, hairpins, clips or nail polish.  For welded (permanent) jewelry: bracelets, anklets, waist bands, etc.  Please have this removed prior to surgery.  If it is not removed, there is a chance that hospital personnel will need to cut it off on the day of surgery.  Do not wear lotions, powders, or perfumes.   Do not shave body hair from the neck down 48 hours before surgery.  Contact lenses, hearing aids and dentures may not be worn into surgery.  Do not bring valuables to the hospital. [facility] is not responsible for any missing/lost belongings or valuables.    Notify your doctor if there is any change in your medical condition (cold, fever, infection).  Wear comfortable clothing (specific to your surgery type) to the hospital.  After surgery, you can help prevent lung complications by doing breathing exercises.  Take deep breaths and cough every 1-2 hours. Your doctor may order a device called an Incentive Spirometer to help you take deep breaths. When coughing or sneezing, hold a pillow firmly against your incision with both hands. This is called "splinting." Doing this helps protect your incision. It also decreases belly discomfort.  If you are being admitted to the hospital overnight, leave your suitcase in the car. After surgery it may be brought to your room.  In case of increased patient census, it may be necessary for you, the patient, to continue your postoperative care in the Same Day Surgery  department.  If you are being discharged the day of surgery, you will not be allowed to drive home. You will need a responsible individual to drive you home and stay with you for 24 hours after surgery.   If you are taking public transportation, you will need to have a responsible individual with you.  Please call the Pre-admissions Testing Dept. at [phone] if you have any  questions about these instructions.  Surgery Visitation Policy:  Patients having surgery or a procedure may have two visitors.  Children under the age of 58 must have an adult with them who is not the patient.     Preparing for Surgery with CHLORHEXIDINE  GLUCONATE (CHG) Soap  Chlorhexidine  Gluconate (CHG) Soap  o An antiseptic cleaner that kills germs and bonds with the skin to continue killing germs even after washing  o Used for showering the night before surgery and morning of surgery  Before surgery, you can play an important role by reducing the number of germs on your skin.  CHG (Chlorhexidine  gluconate) soap is an antiseptic cleanser which kills germs and bonds with the skin to continue killing germs even after washing.  Please do not use if you have an allergy to CHG or antibacterial soaps. If your skin becomes reddened/irritated stop using the CHG.  1. Shower the NIGHT BEFORE SURGERY and the MORNING OF SURGERY with CHG soap.  2. If you choose to wash your hair, wash your hair first as usual with your normal shampoo.  3. After shampooing, rinse your hair and body thoroughly to remove the shampoo.  4. Use CHG as you would any other liquid soap. You can apply CHG directly to the skin and wash gently with a scrungie or a clean washcloth.  5. Apply the CHG soap to your body only from the neck down. Do not use on open wounds or open sores. Avoid contact with your eyes, ears, mouth, and genitals (private parts). Wash face and genitals (private parts) with your normal soap.  6. Wash thoroughly, paying special attention to the area where your surgery will be performed.  7. Thoroughly rinse your body with warm water.  8. Do not shower/wash with your normal soap after using and rinsing off the CHG soap.  9. Pat yourself dry with a clean towel.  10. Wear clean pajamas to bed the night before surgery.  12. Place clean sheets on your bed the night of your first shower and do not  sleep with pets.  13. Shower again with the CHG soap on the day of surgery prior to arriving at the hospital.  14. Do not apply any deodorants/lotions/powders.  15. Please wear clean clothes to the hospital.

## 2023-10-13 ENCOUNTER — Encounter
Admission: RE | Admit: 2023-10-13 | Discharge: 2023-10-13 | Disposition: A | Discharge status: Home / Self Care | Payer: Medicaid Other | Source: Ambulatory Visit | Attending: Obstetrics and Gynecology | Admitting: Obstetrics and Gynecology

## 2023-10-13 DIAGNOSIS — Z01812 Encounter for preprocedural laboratory examination: Secondary | ICD-10-CM

## 2023-10-13 DIAGNOSIS — Z01818 Encounter for other preprocedural examination: Secondary | ICD-10-CM | POA: Insufficient documentation

## 2023-10-13 DIAGNOSIS — E876 Hypokalemia: Secondary | ICD-10-CM | POA: Insufficient documentation

## 2023-10-13 DIAGNOSIS — N921 Excessive and frequent menstruation with irregular cycle: Secondary | ICD-10-CM | POA: Diagnosis not present

## 2023-10-13 LAB — CBC
HCT: 33.5 % — ABNORMAL LOW (ref 36.0–46.0)
Hemoglobin: 9.8 g/dL — ABNORMAL LOW (ref 12.0–15.0)
MCH: 20.2 pg — ABNORMAL LOW (ref 26.0–34.0)
MCHC: 29.3 g/dL — ABNORMAL LOW (ref 30.0–36.0)
MCV: 68.9 fL — ABNORMAL LOW (ref 80.0–100.0)
Platelets: 243 10*3/uL (ref 150–400)
RBC: 4.86 MIL/uL (ref 3.87–5.11)
RDW: 19 % — ABNORMAL HIGH (ref 11.5–15.5)
WBC: 6 10*3/uL (ref 4.0–10.5)
nRBC: 0 % (ref 0.0–0.2)

## 2023-10-13 LAB — BASIC METABOLIC PANEL
Anion gap: 12 (ref 5–15)
BUN: 8 mg/dL (ref 6–20)
CO2: 22 mmol/L (ref 22–32)
Calcium: 8.9 mg/dL (ref 8.9–10.3)
Chloride: 102 mmol/L (ref 98–111)
Creatinine, Ser: 0.52 mg/dL (ref 0.44–1.00)
GFR, Estimated: >60 mL/min (ref >60)
Glucose, Bld: 115 mg/dL — ABNORMAL HIGH (ref 70–99)
Potassium: 3.4 mmol/L — ABNORMAL LOW (ref 3.5–5.1)
Sodium: 136 mmol/L (ref 135–145)

## 2023-10-13 LAB — TYPE AND SCREEN
ABO/RH(D): A POS
Antibody Screen: NEGATIVE

## 2023-10-15 MED ORDER — SODIUM CHLORIDE 0.9 % IV SOLN: INTRAVENOUS | Status: DC

## 2023-10-15 MED ORDER — CLINDAMYCIN PHOSPHATE 900 MG/50ML IV SOLN
900.0000 mg | INTRAVENOUS | Status: AC
  Administered 2023-10-16: 900 mg via INTRAVENOUS

## 2023-10-15 MED ORDER — ORAL CARE MOUTH RINSE: 15.0000 mL | Freq: Once | OROMUCOSAL | Status: AC

## 2023-10-15 MED ORDER — SOD CITRATE-CITRIC ACID 500-334 MG/5ML PO SOLN
30.0000 mL | ORAL | Status: DC
  Filled 2023-10-15: qty 30

## 2023-10-15 MED ORDER — GENTAMICIN SULFATE 40 MG/ML IJ SOLN
5.0000 mg/kg | INTRAVENOUS | Status: AC
  Administered 2023-10-16: 310 mg via INTRAVENOUS
  Filled 2023-10-15: qty 7.75

## 2023-10-15 MED ORDER — CHLORHEXIDINE GLUCONATE 0.12 % MT SOLN
15.0000 mL | Freq: Once | OROMUCOSAL | Status: AC
  Administered 2023-10-16: 15 mL via OROMUCOSAL

## 2023-10-16 ENCOUNTER — Other Ambulatory Visit: Payer: Self-pay

## 2023-10-16 ENCOUNTER — Ambulatory Visit: Payer: Medicaid Other

## 2023-10-16 ENCOUNTER — Encounter
Admission: RE | Disposition: A | Discharge status: Home / Self Care | Payer: Self-pay | Source: Ambulatory Visit | Attending: Obstetrics and Gynecology

## 2023-10-16 ENCOUNTER — Ambulatory Visit
Admission: RE | Admit: 2023-10-16 | Discharge: 2023-10-16 | Disposition: A | Discharge status: Home / Self Care | Payer: Medicaid Other | Source: Ambulatory Visit | Attending: Obstetrics and Gynecology | Admitting: Obstetrics and Gynecology

## 2023-10-16 ENCOUNTER — Encounter: Payer: Self-pay | Admitting: Obstetrics and Gynecology

## 2023-10-16 ENCOUNTER — Ambulatory Visit: Payer: Medicaid Other | Admitting: Urgent Care

## 2023-10-16 DIAGNOSIS — F17210 Nicotine dependence, cigarettes, uncomplicated: Secondary | ICD-10-CM | POA: Insufficient documentation

## 2023-10-16 DIAGNOSIS — N921 Excessive and frequent menstruation with irregular cycle: Secondary | ICD-10-CM | POA: Diagnosis present

## 2023-10-16 DIAGNOSIS — Z01818 Encounter for other preprocedural examination: Secondary | ICD-10-CM

## 2023-10-16 DIAGNOSIS — D251 Intramural leiomyoma of uterus: Secondary | ICD-10-CM | POA: Insufficient documentation

## 2023-10-16 DIAGNOSIS — N736 Female pelvic peritoneal adhesions (postinfective): Secondary | ICD-10-CM | POA: Insufficient documentation

## 2023-10-16 DIAGNOSIS — D259 Leiomyoma of uterus, unspecified: Secondary | ICD-10-CM | POA: Diagnosis present

## 2023-10-16 DIAGNOSIS — Z9851 Tubal ligation status: Secondary | ICD-10-CM | POA: Diagnosis not present

## 2023-10-16 DIAGNOSIS — N888 Other specified noninflammatory disorders of cervix uteri: Secondary | ICD-10-CM | POA: Diagnosis not present

## 2023-10-16 DIAGNOSIS — D500 Iron deficiency anemia secondary to blood loss (chronic): Secondary | ICD-10-CM | POA: Diagnosis not present

## 2023-10-16 HISTORY — PX: ROBOTIC ASSISTED LAPAROSCOPIC HYSTERECTOMY AND SALPINGECTOMY: SHX6379

## 2023-10-16 HISTORY — PX: CYSTOSCOPY: SHX5120

## 2023-10-16 LAB — POCT PREGNANCY, URINE: Preg Test, Ur: NEGATIVE

## 2023-10-16 SURGERY — XI ROBOTIC ASSISTED LAPAROSCOPIC HYSTERECTOMY AND SALPINGECTOMY
Anesthesia: General | Site: Abdomen

## 2023-10-16 MED ORDER — FENTANYL CITRATE (PF) 100 MCG/2ML IJ SOLN
INTRAMUSCULAR | Status: AC
  Filled 2023-10-16: qty 2

## 2023-10-16 MED ORDER — OXYCODONE-ACETAMINOPHEN 5-325 MG PO TABS: 1.0000 | ORAL_TABLET | Freq: Four times a day (QID) | ORAL | 0 refills | Status: DC | PRN

## 2023-10-16 MED ORDER — OXYCODONE HCL 5 MG PO TABS
5.0000 mg | ORAL_TABLET | Freq: Once | ORAL | Status: AC
Start: 2023-10-16 — End: 2023-10-16
  Administered 2023-10-16: 5 mg via ORAL

## 2023-10-16 MED ORDER — BUPIVACAINE HCL 0.5 % IJ SOLN
INTRAMUSCULAR | Status: DC | PRN
  Administered 2023-10-16: 9 mL

## 2023-10-16 MED ORDER — CLINDAMYCIN PHOSPHATE 900 MG/50ML IV SOLN
INTRAVENOUS | Status: AC
  Filled 2023-10-16: qty 50

## 2023-10-16 MED ORDER — IBUPROFEN 600 MG PO TABS: 600.0000 mg | ORAL_TABLET | Freq: Four times a day (QID) | ORAL | 0 refills | Status: DC

## 2023-10-16 MED ORDER — PROPOFOL 10 MG/ML IV BOLUS
INTRAVENOUS | Status: DC | PRN
  Administered 2023-10-16: 100 ug/kg/min via INTRAVENOUS
  Administered 2023-10-16: 150 ug/kg/min via INTRAVENOUS
  Administered 2023-10-16: 150 mg via INTRAVENOUS

## 2023-10-16 MED ORDER — DEXAMETHASONE SODIUM PHOSPHATE 10 MG/ML IJ SOLN
INTRAMUSCULAR | Status: DC | PRN
  Administered 2023-10-16: 10 mg via INTRAVENOUS

## 2023-10-16 MED ORDER — ACETAMINOPHEN 10 MG/ML IV SOLN
INTRAVENOUS | Status: AC
  Filled 2023-10-16: qty 100

## 2023-10-16 MED ORDER — PROPOFOL 1000 MG/100ML IV EMUL
INTRAVENOUS | Status: AC
  Filled 2023-10-16: qty 100

## 2023-10-16 MED ORDER — LIDOCAINE HCL (CARDIAC) PF 100 MG/5ML IV SOSY
PREFILLED_SYRINGE | INTRAVENOUS | Status: DC | PRN
  Administered 2023-10-16: 100 mg via INTRAVENOUS

## 2023-10-16 MED ORDER — FENTANYL CITRATE (PF) 100 MCG/2ML IJ SOLN
25.0000 ug | INTRAMUSCULAR | Status: DC | PRN
  Administered 2023-10-16 (×2): 25 ug via INTRAVENOUS

## 2023-10-16 MED ORDER — KETOROLAC TROMETHAMINE 30 MG/ML IJ SOLN
INTRAMUSCULAR | Status: DC | PRN
  Administered 2023-10-16: 30 mg via INTRAVENOUS

## 2023-10-16 MED ORDER — ACETAMINOPHEN 10 MG/ML IV SOLN
INTRAVENOUS | Status: DC | PRN
  Administered 2023-10-16: 1000 mg via INTRAVENOUS

## 2023-10-16 MED ORDER — ONDANSETRON 4 MG PO TBDP: 4.0000 mg | ORAL_TABLET | Freq: Four times a day (QID) | ORAL | 0 refills | Status: DC | PRN

## 2023-10-16 MED ORDER — HEMOSTATIC AGENTS (NO CHARGE) OPTIME
TOPICAL | Status: DC | PRN
  Administered 2023-10-16: 1 via TOPICAL

## 2023-10-16 MED ORDER — PHENYLEPHRINE 80 MCG/ML (10ML) SYRINGE FOR IV PUSH (FOR BLOOD PRESSURE SUPPORT)
PREFILLED_SYRINGE | INTRAVENOUS | Status: DC | PRN
  Administered 2023-10-16: 80 ug via INTRAVENOUS
  Administered 2023-10-16: 160 ug via INTRAVENOUS
  Administered 2023-10-16: 80 ug via INTRAVENOUS

## 2023-10-16 MED ORDER — MIDAZOLAM HCL 2 MG/2ML IJ SOLN
INTRAMUSCULAR | Status: AC
  Filled 2023-10-16: qty 2

## 2023-10-16 MED ORDER — ONDANSETRON HCL 4 MG/2ML IJ SOLN
INTRAMUSCULAR | Status: DC | PRN
  Administered 2023-10-16: 4 mg via INTRAVENOUS

## 2023-10-16 MED ORDER — CHLORHEXIDINE GLUCONATE 0.12 % MT SOLN
OROMUCOSAL | Status: AC
  Filled 2023-10-16: qty 15

## 2023-10-16 MED ORDER — BUPIVACAINE HCL (PF) 0.5 % IJ SOLN
INTRAMUSCULAR | Status: AC
  Filled 2023-10-16: qty 30

## 2023-10-16 MED ORDER — DROPERIDOL 2.5 MG/ML IJ SOLN: 0.6250 mg | Freq: Once | INTRAMUSCULAR | Status: DC | PRN

## 2023-10-16 MED ORDER — ROCURONIUM BROMIDE 100 MG/10ML IV SOLN
INTRAVENOUS | Status: DC | PRN
  Administered 2023-10-16: 20 mg via INTRAVENOUS
  Administered 2023-10-16 (×2): 50 mg via INTRAVENOUS

## 2023-10-16 MED ORDER — CLINDAMYCIN PHOSPHATE 600 MG/50ML IV SOLN
INTRAVENOUS | Status: AC
  Filled 2023-10-16: qty 50

## 2023-10-16 MED ORDER — 0.9 % SODIUM CHLORIDE (POUR BTL) OPTIME
TOPICAL | Status: DC | PRN
  Administered 2023-10-16: 500 mL

## 2023-10-16 MED ORDER — POVIDONE-IODINE 10 % EX SWAB
2.0000 | Freq: Once | CUTANEOUS | Status: AC
  Administered 2023-10-16: 2 via TOPICAL

## 2023-10-16 MED ORDER — FENTANYL CITRATE (PF) 100 MCG/2ML IJ SOLN
INTRAMUSCULAR | Status: DC | PRN
  Administered 2023-10-16 (×4): 50 ug via INTRAVENOUS

## 2023-10-16 MED ORDER — MIDAZOLAM HCL 2 MG/2ML IJ SOLN
INTRAMUSCULAR | Status: DC | PRN
  Administered 2023-10-16: 2 mg via INTRAVENOUS

## 2023-10-16 MED ORDER — LACTATED RINGERS IV SOLN: INTRAVENOUS | Status: DC

## 2023-10-16 MED ORDER — ROCURONIUM BROMIDE 10 MG/ML (PF) SYRINGE
PREFILLED_SYRINGE | INTRAVENOUS | Status: AC
  Filled 2023-10-16: qty 10

## 2023-10-16 MED ORDER — KETOROLAC TROMETHAMINE 30 MG/ML IJ SOLN
INTRAMUSCULAR | Status: AC
  Filled 2023-10-16: qty 1

## 2023-10-16 MED ORDER — SUGAMMADEX SODIUM 200 MG/2ML IV SOLN
INTRAVENOUS | Status: DC | PRN
  Administered 2023-10-16 (×2): 200 mg via INTRAVENOUS

## 2023-10-16 MED ORDER — OXYCODONE HCL 5 MG PO TABS
ORAL_TABLET | ORAL | Status: AC
  Filled 2023-10-16: qty 1

## 2023-10-16 SURGICAL SUPPLY — 68 items
APPLICATOR ARISTA FLEXITIP XL (MISCELLANEOUS) IMPLANT
BAG URINE DRAIN 2000ML AR STRL (UROLOGICAL SUPPLIES) ×2 IMPLANT
BLADE SURG 15 STRL LF DISP TIS (BLADE) ×2 IMPLANT
BLADE SURG SZ11 CARB STEEL (BLADE) ×2 IMPLANT
CANNULA CAP OBTURATR AIRSEAL 8 (CAP) ×2 IMPLANT
CATH URTH 16FR FL 2W BLN LF (CATHETERS) ×2 IMPLANT
COVER TIP SHEARS 8 DVNC (MISCELLANEOUS) ×2 IMPLANT
DERMABOND ADVANCED .7 DNX12 (GAUZE/BANDAGES/DRESSINGS) ×2 IMPLANT
DRAPE ARM DVNC X/XI (DISPOSABLE) ×8 IMPLANT
DRAPE COLUMN DVNC XI (DISPOSABLE) ×2 IMPLANT
DRAPE UNDER BUTTOCK W/FLU (DRAPES) ×2 IMPLANT
DRIVER NDL MEGA SUTCUT DVNCXI (INSTRUMENTS) ×2 IMPLANT
DRIVER NDLE MEGA SUTCUT DVNCXI (INSTRUMENTS) ×2
ELECT REM PT RETURN 9FT ADLT (ELECTROSURGICAL) ×2
ELECTRODE REM PT RTRN 9FT ADLT (ELECTROSURGICAL) ×2 IMPLANT
FORCEPS BPLR FENES DVNC XI (FORCEP) ×2 IMPLANT
FORCEPS BPLR R/ABLATION 8 DVNC (INSTRUMENTS) ×2 IMPLANT
GAUZE 4X4 16PLY ~~LOC~~+RFID DBL (SPONGE) ×2 IMPLANT
GLOVE BIO SURGEON STRL SZ7 (GLOVE) ×6 IMPLANT
GLOVE BIOGEL PI IND STRL 7.5 (GLOVE) ×6 IMPLANT
GOWN STRL REUS W/ TWL LRG LVL3 (GOWN DISPOSABLE) ×6 IMPLANT
GOWN STRL REUS W/ TWL XL LVL3 (GOWN DISPOSABLE) ×2 IMPLANT
HEMOSTAT ARISTA ABSORB 3G PWDR (HEMOSTASIS) IMPLANT
IRRIGATION STRYKERFLOW (MISCELLANEOUS) IMPLANT
IRRIGATOR STRYKERFLOW (MISCELLANEOUS)
IRRIGATOR SUCT 8 DISP DVNC XI (IRRIGATION / IRRIGATOR) IMPLANT
IV LACTATED RINGERS 1000ML (IV SOLUTION) ×2 IMPLANT
IV NS 1000ML BAXH (IV SOLUTION) ×2 IMPLANT
KIT PINK PAD W/HEAD ARE REST (MISCELLANEOUS) ×2
KIT PINK PAD W/HEAD ARM REST (MISCELLANEOUS) ×2 IMPLANT
KIT TURNOVER CYSTO (KITS) ×2 IMPLANT
LABEL OR SOLS (LABEL) ×2 IMPLANT
MANIFOLD NEPTUNE II (INSTRUMENTS) ×2 IMPLANT
MANIPULATOR VCARE LG CRV RETR (MISCELLANEOUS) IMPLANT
MANIPULATOR VCARE SML CRV RETR (MISCELLANEOUS) IMPLANT
MANIPULATOR VCARE STD CRV RETR (MISCELLANEOUS) IMPLANT
NDL HYPO 22X1.5 SAFETY MO (MISCELLANEOUS) ×2 IMPLANT
NEEDLE HYPO 22X1.5 SAFETY MO (MISCELLANEOUS) ×2
NS IRRIG 500ML POUR BTL (IV SOLUTION) ×2 IMPLANT
OBTURATOR OPTICAL STND 8 DVNC (TROCAR) ×2
OBTURATOR OPTICALSTD 8 DVNC (TROCAR) ×2 IMPLANT
OCCLUDER COLPOPNEUMO (BALLOONS) ×2 IMPLANT
PACK GYN LAPAROSCOPIC (MISCELLANEOUS) ×2 IMPLANT
PAD PREP OB/GYN DISP 24X41 (PERSONAL CARE ITEMS) ×2 IMPLANT
SCISSORS MNPLR CVD DVNC XI (INSTRUMENTS) ×2 IMPLANT
SCRUB CHG 4% DYNA-HEX 4OZ (MISCELLANEOUS) ×2 IMPLANT
SEAL UNIV 5-12 XI (MISCELLANEOUS) ×6 IMPLANT
SEALER VESSEL EXT DVNC XI (MISCELLANEOUS) IMPLANT
SET CYSTO W/LG BORE CLAMP LF (SET/KITS/TRAYS/PACK) ×2 IMPLANT
SET TUBE FILTERED XL AIRSEAL (SET/KITS/TRAYS/PACK) ×2 IMPLANT
SOL ELECTROSURG ANTI STICK (MISCELLANEOUS) ×2
SOL PREP PVP 2OZ (MISCELLANEOUS) ×2
SOLUTION ELECTROSURG ANTI STCK (MISCELLANEOUS) ×2 IMPLANT
SOLUTION PREP PVP 2OZ (MISCELLANEOUS) ×2 IMPLANT
SPONGE T-LAP 18X18 ~~LOC~~+RFID (SPONGE) IMPLANT
SURGILUBE 2OZ TUBE FLIPTOP (MISCELLANEOUS) ×2 IMPLANT
SUT MNCRL 4-0 27 PS-2 XMFL (SUTURE) ×4
SUT MNCRL 4-0 27XMFL (SUTURE) ×4
SUT STRATA PDS 0 30 CT-2.5 (SUTURE) ×2 IMPLANT
SUT STRATAFIX SPIRAL PDS+ 0 30 (SUTURE) IMPLANT
SUT VIC AB 0 CT2 27 (SUTURE) ×2 IMPLANT
SUTURE MNCRL 4-0 27XMF (SUTURE) ×2 IMPLANT
SYR 10ML LL (SYRINGE) ×2 IMPLANT
SYR 50ML LL SCALE MARK (SYRINGE) ×2 IMPLANT
SYR TOOMEY IRRIG 70ML (MISCELLANEOUS) ×2
SYRINGE TOOMEY IRRIG 70ML (MISCELLANEOUS) IMPLANT
TRAP FLUID SMOKE EVACUATOR (MISCELLANEOUS) ×2 IMPLANT
WATER STERILE IRR 500ML POUR (IV SOLUTION) ×2 IMPLANT

## 2023-10-16 NOTE — Anesthesia Preprocedure Evaluation (Signed)
Anesthesia Evaluation  Patient identified by MRN, date of birth, ID band Patient awake    Reviewed: Allergy & Precautions, H&P , NPO status , Patient's Chart, lab work & pertinent test results, reviewed documented beta blocker date and time   History of Anesthesia Complications Negative for: history of anesthetic complications  Airway Mallampati: II  TM Distance: >3 FB Neck ROM: full    Dental  (+) Dental Advidsory Given, Chipped, Teeth Intact   Pulmonary shortness of breath and with exertion, neg sleep apnea, pneumonia, resolved, neg COPD, neg recent URI, Current Smoker   Pulmonary exam normal breath sounds clear to auscultation       Cardiovascular Exercise Tolerance: Good negative cardio ROS Normal cardiovascular exam Rhythm:regular Rate:Normal     Neuro/Psych negative neurological ROS  negative psych ROS   GI/Hepatic Neg liver ROS, PUD,,,  Endo/Other  negative endocrine ROS    Renal/GU negative Renal ROS  negative genitourinary   Musculoskeletal   Abdominal   Peds  Hematology  (+) Blood dyscrasia, anemia   Anesthesia Other Findings Past Medical History: No date: Anemia No date: Chronic headaches No date: Cigarette smoker No date: Dyspnea     Comment:  has occ sob with exertion since having pneumonia in dec               2024 and pt is anemic No date: Fibroid uterus No date: Gastric ulcer No date: Menorrhagia with irregular cycle 08/2023: Pneumonia   Reproductive/Obstetrics negative OB ROS                             Anesthesia Physical Anesthesia Plan  ASA: 2  Anesthesia Plan: General   Post-op Pain Management:    Induction: Intravenous  PONV Risk Score and Plan: 2 and Ondansetron, Dexamethasone, Treatment may vary due to age or medical condition and Midazolam  Airway Management Planned: Oral ETT  Additional Equipment:   Intra-op Plan:   Post-operative Plan:  Extubation in OR  Informed Consent: I have reviewed the patients History and Physical, chart, labs and discussed the procedure including the risks, benefits and alternatives for the proposed anesthesia with the patient or authorized representative who has indicated his/her understanding and acceptance.     Dental Advisory Given  Plan Discussed with: Anesthesiologist, CRNA and Surgeon  Anesthesia Plan Comments:        Anesthesia Quick Evaluation

## 2023-10-16 NOTE — Anesthesia Postprocedure Evaluation (Signed)
Anesthesia Post Note  Patient: Faith Ellis  Procedure(s) Performed: XI ROBOTIC ASSISTED LAPAROSCOPIC HYSTERECTOMY AND SALPINGECTOMY, LYSIS OF ADHESIONS (Bilateral: Abdomen) CYSTOSCOPY  Patient location during evaluation: PACU Anesthesia Type: General Level of consciousness: awake and alert Pain management: pain level controlled Vital Signs Assessment: post-procedure vital signs reviewed and stable Respiratory status: spontaneous breathing, nonlabored ventilation, respiratory function stable and patient connected to nasal cannula oxygen Cardiovascular status: blood pressure returned to baseline and stable Postop Assessment: no apparent nausea or vomiting Anesthetic complications: no   There were no known notable events for this encounter.   Last Vitals:  Vitals:   10/16/23 1224 10/16/23 1241  BP: 121/63   Pulse: 77 80  Resp: 12 15  Temp: (!) 36.3 C 36.4 C  SpO2: 100% 100%    Last Pain:  Vitals:   10/16/23 1241  TempSrc: Temporal  PainSc: 0-No pain                 Lenard Simmer

## 2023-10-16 NOTE — Interval H&P Note (Signed)
History and Physical Interval Note:  10/16/2023 7:34 AM  Faith Ellis  has presented today for surgery, with the diagnosis of menorrhagia with irregular cycle, fibroid uterus.  The various methods of treatment have been discussed with the patient and family. After consideration of risks, benefits and other options for treatment, the patient has consented to  Procedure(s): XI ROBOTIC ASSISTED LAPAROSCOPIC HYSTERECTOMY AND SALPINGECTOMY (Bilateral) CYSTOSCOPY (N/A) as a surgical intervention.  The patient's history has been reviewed, patient examined, no change in status, stable for surgery.  I have reviewed the patient's chart and labs.  Questions were answered to the patient's satisfaction.    Thomasene Mohair, MD, [facility] 10/16/2023 7:34 AM

## 2023-10-16 NOTE — Transfer of Care (Signed)
Immediate Anesthesia Transfer of Care Note  Patient: Faith Ellis  Procedure(s) Performed: XI ROBOTIC ASSISTED LAPAROSCOPIC HYSTERECTOMY AND SALPINGECTOMY, LYSIS OF ADHESIONS (Bilateral: Abdomen) CYSTOSCOPY  Patient Location: PACU  Anesthesia Type:General  Level of Consciousness: sedated  Airway & Oxygen Therapy: Patient Spontanous Breathing and Patient connected to face mask oxygen  Post-op Assessment: Report given to RN and Post -op Vital signs reviewed and stable  Post vital signs: Reviewed and stable  Last Vitals:  Vitals Value Taken Time  BP 85/47 10/16/23 1118  Temp 36.4 C 10/16/23 1115  Pulse 81 10/16/23 1118  Resp 19 10/16/23 1118  SpO2 100 % 10/16/23 1118  Vitals shown include unfiled device data.  Last Pain:  Vitals:   10/16/23 0703  PainSc: 0-No pain         Complications: There were no known notable events for this encounter.

## 2023-10-16 NOTE — Anesthesia Procedure Notes (Signed)
Procedure Name: Intubation Date/Time: 10/16/2023 7:52 AM  Performed by: Lysbeth Penner, CRNAPre-anesthesia Checklist: Patient identified, Emergency Drugs available, Suction available and Patient being monitored Patient Re-evaluated:Patient Re-evaluated prior to induction Oxygen Delivery Method: Circle system utilized Preoxygenation: Pre-oxygenation with 100% oxygen Induction Type: IV induction Ventilation: Mask ventilation without difficulty Laryngoscope Size: McGrath and 3 Tube type: Oral Tube size: 6.5 mm Number of attempts: 1 Airway Equipment and Method: Stylet and Oral airway Placement Confirmation: ETT inserted through vocal cords under direct vision, positive ETCO2 and breath sounds checked- equal and bilateral Secured at: 22 cm Tube secured with: Tape Dental Injury: Teeth and Oropharynx as per pre-operative assessment

## 2023-10-16 NOTE — Op Note (Signed)
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 2) Dense adhesions of bladder to lower uterine segment and lateral pelvic sidewall. 3) normal-appearing bilateral fallopian tubes with changes consistent with history of tubal ligation. 4) left ovary with several simple appearing cysts. 5) on cystoscopy: Normal-appearing bladder without evidence of perforation or damage.  Visualization of efflux of urine from the bilateral ureteral orifices.  Procedure Summary:  The patient was taken to the operating room where general anesthesia was administered and found to be adequate. She was placed in the dorsal supine lithotomy position in [place] stirrups and prepped and draped in the usual, sterile fashion. After a timeout was called an indwelling catheter was placed in her bladder.  A sterile speculum was placed in her vagina.  The anterior lip of the cervix was grasped with the single-tooth tenaculum.  The cervix was serially dilated to an 11 Pratt dilator.  The large Vcare device was placed in accordance to the manufacturer's recommendations.  The tenaculum and speculum were removed.    Attention was turned to the abdomen where after injection of local anesthetic, an 8 mm infraumbilical incision was made with the scalpel. Entry into the abdomen was obtained via Optiview trocar technique (a blunt entry technique with camera visualization through the obturator upon entry). Verification of entry into the abdomen was obtained using opening pressures. The abdomen was insufflated with CO2. The camera was introduced through the trocar with verification of atraumatic entry.  Right and left abdominal entry sites were created after injection of local anesthetic about 8 cm lateral to the umbilical port in accordance with the Intuitive manufacturer's recommendations.  The port sites were 8 mm.  The intuitive trochars were introduced under intra-abdominal camera visualization without difficulty   The XI robot was docked on the patient's left.  Clearance was verified from the patient's legs.  Through the umbilical port the camera was placed.  Through the port attached to arm 4 the monopolar scissors were placed.  Through the port attached to arm 2 the forced bipolar forceps were was placed.     An inspection was undertaken of the pelvis with the above-noted findings.  A small omental adhesion to the anterior abdominal wall was noted at around the area where her prior C-section scar would be located.  This was taken down without difficulty and hemostasis was verified.  This was done in order to gain visualization.  The bilateral ureters were identified and found to be well away from the operative area of interest. The right fallopian tube was grasped at the fimbriated end and was transected along the mesosalpinx in a lateral-to-medial fashion.  The left fallopian tube was removed in  a similar fashion.  Both fallopian tubes were removed from the body cavity.    Initial dissection of the lower uterine segment was begun with significant adhesions noted.  The entire adhesive surface was not able to be removed  at this point.The round ligament was cauterized and transected on the right. The broad ligament was opened posteriorly and as much as possible anteriorly.  The utero-ovarian ligament was transected. Tissue was divided along the right broad ligament to the level of the internal cervical os posteriorly.  This was accomplished on the contralateral side as well.  Some space was able to be created to help define the plane necessarily to transect to reduce the bladder tissue.  The bilateral uterine arteries were isolated, cauterized, and transected.  In order to dissect the remaining bladder tissue from the lower uterine segment, it was necessary to backfilled the bladder with approximately 250 mL of sterile saline.  With this delineating the outline of the bladder, the remaining adhesions were able to be reduced from the lower uterine segment.  The anterior vaginal/cervical border was identified at this point.  The bladder was drained at this point.  The colpotomy was performed using monopolar electrocautery in a circumferential fashion following the KOH ring.  The uterus and fallopian tubes and cervix were removed through the vagina.   Closure of the vaginal cuff was undertaken using the V-lock stitch in a running fashion.  All vascular pedicles were inspected and found to be hemostatic.  Arista 3 g was placed along all vascular pedicles.  The pressure in the abdomen was lowered to 5 mmHg to verify ongoing hemostasis.  All instruments removed from the robotic ports.  The robot was undocked from the patient.  The abdomen was then desufflated of CO2 with the aid of 5 deep breaths from anesthesia.  All trochars were then removed.  All skin incisions were closed using 4-0 monocryl in a subcuticular fashion and reinforced using surgical skin glue.   Cystoscopy was undertaken at this point. The Foley catheter was removed and the 70 cystoscope was gently introduced through the urethra. The bladder survey was undertaken  with efflux of urine from both orifices noted. There were no defects noted in the bladder wall. The cystoscope was utilized to partially empty the bladder.  A sterile speculum was placed in the vagina to visualize the cervical cuff which was found to be hemostatic.  Also a visual inspection was undertaken to verify the vagina was clear of any sponges or instruments.  The speculum was removed.  A digital sweep of the vagina was undertaken to ensure no instruments or sponges remained and this was verified to be clear.  The patient tolerated the procedure well.  Sponge, lap, needle, and instrument counts were correct x 2.  VTE prophylaxis: SCDs. Antibiotic prophylaxis: Clindamycin and Gentamicin IV prior to skin incision. She was awakened in the operating room and was taken to the PACU in stable condition.   Thomasene Mohair, MD, [facility] 10/16/2023 11:11 AM

## 2023-10-17 ENCOUNTER — Encounter: Payer: Self-pay | Admitting: Obstetrics and Gynecology

## 2023-10-23 LAB — SURGICAL PATHOLOGY

## 2023-12-31 ENCOUNTER — Emergency Department
Admission: EM | Admit: 2023-12-31 | Discharge: 2023-12-31 | Disposition: A | Discharge status: Home / Self Care | Attending: Emergency Medicine | Admitting: Emergency Medicine

## 2023-12-31 ENCOUNTER — Other Ambulatory Visit: Payer: Self-pay

## 2023-12-31 ENCOUNTER — Emergency Department

## 2023-12-31 DIAGNOSIS — M545 Low back pain, unspecified: Secondary | ICD-10-CM | POA: Diagnosis present

## 2023-12-31 DIAGNOSIS — M5440 Lumbago with sciatica, unspecified side: Secondary | ICD-10-CM | POA: Diagnosis not present

## 2023-12-31 LAB — POC URINE PREG, ED: Preg Test, Ur: NEGATIVE

## 2023-12-31 MED ORDER — PREDNISONE 10 MG PO TABS: 10.0000 mg | ORAL_TABLET | Freq: Every day | ORAL | 0 refills | Status: DC

## 2023-12-31 MED ORDER — KETOROLAC TROMETHAMINE 10 MG PO TABS: 10.0000 mg | ORAL_TABLET | Freq: Three times a day (TID) | ORAL | 0 refills | Status: DC | PRN

## 2023-12-31 MED ORDER — CYCLOBENZAPRINE HCL 5 MG PO TABS: 5.0000 mg | ORAL_TABLET | Freq: Three times a day (TID) | ORAL | 0 refills | Status: DC | PRN

## 2023-12-31 NOTE — ED Triage Notes (Signed)
 Pt here with back pain and numbness. Pt states the numbness is in her left leg, reports that it is tingling. Pt states the pain is her entire left side radiating down her leg. Pt still able to walk. Pt denies CP or SOB.

## 2023-12-31 NOTE — ED Provider Notes (Signed)
 [facility] Emergency Department Provider Note     Event Date/Time   First MD Initiated Contact with Patient 12/31/23 1902     (approximate)   History   Back Pain   HPI  Faith Ellis is a 38 y.o. female presents to the ED for evaluation of low back pain x 4 weeks.  No known injury.  Patient reports the pain starts in her lower back and radiates down her left leg.  She describes this pain as a intermittent tingling.  Denies fever, loss of bowel and bladder control and saddle anesthesia.  Has taken Tylenol and ibuprofen with no relief.  Of note patient reports a rash to her right lower leg.  She reports she has had this rash since August when she returned from a beach trip with her family.  Describes it as dry and itchy.  No pain. She has tried hydrocortisone cream and lotion on the area, but it continues to be bothersome.      Physical Exam   Triage Vital Signs: ED Triage Vitals [12/31/23 1437]  Encounter Vitals Group     BP 130/79     Systolic BP Percentile      Diastolic BP Percentile      Pulse Rate 84     Resp 17     Temp 98.4 F (36.9 C)     Temp Source Oral     SpO2 99 %     Weight 173 lb 1 oz (78.5 kg)     Height 5\' 3"  (1.6 m)     Head Circumference      Peak Flow      Pain Score 4     Pain Loc      Pain Education      Exclude from Growth Chart     Most recent vital signs: Vitals:   12/31/23 1437 12/31/23 1854  BP: 130/79 114/72  Pulse: 84 89  Resp: 17 18  Temp: 98.4 F (36.9 C) 98.3 F (36.8 C)  SpO2: 99% 99%    General: Well appearing. Alert and oriented. INAD.  Skin:  Warm, dry and intact. No rashes or lesions noted.     Head:  NCAT.  Eyes:  PERRLA. EOMI.  Neck:   No cervical spine tenderness to palpation. Full ROM without difficulty.  CV:  Good peripheral perfusion.  RESP:  Normal effort.  BACK:  Spinous process is midline without deformity or tenderness.  NEURO: Cranial nerves  intact. No focal deficits.  Sensation and motor function intact. 5/5 muscle strength of UE & LE. Gait is steady.   Other: right shin reveals approx 2cm mildly erythremic, flaky, flat and blancahble rash. Non tender to palpation.     ED Results / Procedures / Treatments   Labs (all labs ordered are listed, but only abnormal results are displayed) Labs Reviewed  POC URINE PREG, ED    RADIOLOGY  I personally viewed and evaluated these images as part of my medical decision making, as well as reviewing the written report by the radiologist.  ED Provider Interpretation: No acute bony abnormality of the CT lumbar  CT Lumbar Spine Wo Contrast Result Date: 12/31/2023 CLINICAL DATA:  Low back pain. EXAM: CT LUMBAR SPINE WITHOUT CONTRAST TECHNIQUE: Multidetector CT imaging of the lumbar spine was performed without intravenous contrast administration. Multiplanar CT image reconstructions were also generated. RADIATION DOSE REDUCTION: This exam was performed according to the departmental dose-optimization program which includes automated exposure control,  adjustment of the mA and/or kV according to patient size and/or use of iterative reconstruction technique. COMPARISON:  CT abdomen and pelvis 01/29/2016 FINDINGS: Segmentation: 5 lumbar type vertebrae. Alignment: Normal. Vertebrae: No acute fracture or focal pathologic process. There is mild levoconvex curvature of the lumbar spine which may be positional. Normal variant versus old fracture with nonunion of the left transverse process at L1. Paraspinal and other soft tissues: Negative. Disc levels: There is mild disc space narrowing and vacuum disc phenomena at L5-S1. Disc spaces are otherwise well maintained. There is mild central disc protrusion at L5-S1 and uncovertebral spurring causing mild left neural foraminal stenosis. There is no central canal stenosis. Other: There are atherosclerotic calcifications of the aorta. Cholecystectomy clips are present. There is a low-density left  adrenal nodule compatible with adenoma measuring 1.8 by 1.4 cm, new from prior. IMPRESSION: 1. No acute fracture or traumatic subluxation of the lumbar spine. 2. Mild degenerative changes at L5-S1 with mild left neural foraminal stenosis. 3. New left adrenal adenoma.  No follow-up imaging recommended. Aortic Atherosclerosis (ICD10-I70.0). Electronically Signed   By: Darliss Cheney M.D.   On: 12/31/2023 17:52    PROCEDURES:  Critical Care performed: No  Procedures   MEDICATIONS ORDERED IN ED: Medications - No data to display   IMPRESSION / MDM / ASSESSMENT AND PLAN / ED COURSE  I reviewed the triage vital signs and the nursing notes.                                 38 y.o. female presents to the emergency department for evaluation and treatment of chronic back pain with left-sided radiation down leg x 4 weeks without injury and a rash. See HPI for further details.   Differential diagnosis includes, but is not limited to herniated disc, stenosis, sciatica  Patient's presentation is most consistent with acute complicated illness / injury requiring diagnostic workup.  Patient is alert and oriented.  She is hemodynamically stable and afebrile.  No back red flag signs.  Neuroexam is reassuring.  CT lumbar spine obtained in triage is reassuring.  Does reveal degenerative changes at L5-S1 with mild left neuroforaminal stenosis which could be indicating her presentation.  Analgesics for offered to patient in the ED but she kindly declined.  Patient would like medication sent to her pharmacy which is reasonable.  I do believe patient is in stable condition for outpatient management.  A referral for a primary care provider has been ordered and a list has been provided for her to schedule an appointment. ED return precautions discussed. All questions and concerns were addressed during this ED visit.    Encourage patient to use over-the-counter Benadryl cream and Vaseline as a barrier over rash and  can follow-up with PCP.   FINAL CLINICAL IMPRESSION(S) / ED DIAGNOSES   Final diagnoses:  Acute left-sided low back pain with sciatica, sciatica laterality unspecified   Rx / DC Orders   ED Discharge Orders          Ordered    ketorolac (TORADOL) 10 MG tablet  Every 8 hours PRN        12/31/23 1945    cyclobenzaprine (FLEXERIL) 5 MG tablet  3 times daily PRN        12/31/23 1945    predniSONE (DELTASONE) 10 MG tablet  Daily        12/31/23 1945    Ambulatory Referral to Primary Care (  Establish Care)        12/31/23 1945             Note:  This document was prepared using Dragon voice recognition software and may include unintentional dictation errors.    Romeo Apple, Lorenza Shakir A, PA-C 12/31/23 2009    Claybon Jabs, MD 01/01/24 1537

## 2023-12-31 NOTE — ED Provider Triage Note (Signed)
 Emergency Medicine Provider Triage Evaluation Note  Faith Ellis , a 38 y.o. female  was evaluated in triage.  Pt complains of low back pain with radiation into left lower extremity.  No hx of injury  Review of Systems  Positive: Low back pain. S/p hysterectomy  Negative: No injury  Physical Exam  BP 130/79   Pulse 84   Temp 98.4 F (36.9 C) (Oral)   Resp 17   Ht 5\' 3"  (1.6 m)   Wt 78.5 kg   SpO2 99%   BMI 30.66 kg/m  Gen:   Awake, no distress   Resp:  Normal effort  MSK:   Moves extremities without difficulty.  Normal gait without assistance.   Other:    Medical Decision Making  Medically screening exam initiated at 2:38 PM.  Appropriate orders placed.  Faith Ellis was informed that the remainder of the evaluation will be completed by another provider, this initial triage assessment does not replace that evaluation, and the importance of remaining in the ED until their evaluation is complete.     Tommi Rumps, PA-C 12/31/23 1440

## 2023-12-31 NOTE — Discharge Instructions (Signed)
 Your evaluated in the ED for low back pain.  Your CT scan is reassuring.  We have placed a referral to primary care for you to establish care.  I have also added a list below of primary care to except new patients.  Please schedule appointment for follow-up.  Please go to the following website to schedule new (and existing) patient appointments:   [email or web address]   The following is a list of primary care offices in the area who are accepting new patients at this time.  Please reach out to one of them directly and let them know you would like to schedule an appointment to follow up on an Emergency Department visit, and/or to establish a new primary care provider (PCP).  There are likely other primary care clinics in the are who are accepting new patients, but this is an excellent place to start:  [facility] Lead physician: Dr Shirlee Latch [address] [place], [place] [address] [phone]  [facility] Lead Physician: Dr Alba Cory [address], [place], [place] [address] [phone]  [facility]  Lead Physician: Dr Olevia Perches [address] [place], [place] [address] [phone]  [facility] Lead Physician: Dr Sofie Hartigan [address] [place], [place] [address] [phone]  [facility] Lead Physician: Dr Bari Edward [address] [place], [place], [place] [address] [phone]

## 2024-02-05 ENCOUNTER — Ambulatory Visit: Admitting: Family Medicine

## 2024-02-05 VITALS — BP 130/88 | HR 78 | Temp 98.0°F | Ht 63.0 in

## 2024-02-05 DIAGNOSIS — D259 Leiomyoma of uterus, unspecified: Secondary | ICD-10-CM | POA: Diagnosis not present

## 2024-02-05 DIAGNOSIS — M5442 Lumbago with sciatica, left side: Secondary | ICD-10-CM | POA: Diagnosis not present

## 2024-02-05 DIAGNOSIS — E66811 Obesity, class 1: Secondary | ICD-10-CM | POA: Diagnosis not present

## 2024-02-05 MED ORDER — MELOXICAM 15 MG PO TABS: 15.0000 mg | ORAL_TABLET | Freq: Every day | ORAL | 0 refills | Status: DC

## 2024-02-05 NOTE — Progress Notes (Signed)
 New Patient Office Visit  Subjective    Patient ID: Faith Ellis  Donal Frohlich, female    DOB: [birth year]  Age: 38 y.o. MRN: [id number]  CC:  Chief Complaint  Patient presents with   Establish Care    Patient presents today for establish care. She would like to discuss her low back pain that radiates down her left leg to the anterior aspect of her knee. She has this pain daily. She is taking tylenol  and IBU for the pain.     HPI Faith  L Ellis presents to establish care  Concerns:   Low back pain. Reports she was in ED month ago for back pain. She continues to have pain on her left lower back. She started having pain since she had Hysterectomy Jan 2025 due to Irregular periods 2/2 fibroids.  Went back to work in Feb, since then she has constant pain left lower back radiates down to her knee posteriorly.  Denies muscle weakness, tingling sensation,fall or injury, bowel or bladder dysfunction.  History of back pain during her last pregnancy 11 years.  Constant ache. Not sure what makes it worse  or better.  Tylenol  and Advil  with some relief.      Outpatient Encounter Medications as of 02/05/2024  Medication Sig   acetaminophen  (TYLENOL ) 500 MG tablet Take 500 mg by mouth every 6 (six) hours as needed for moderate pain (pain score 4-6).   meloxicam  (MOBIC ) 15 MG tablet Take 1 tablet (15 mg total) by mouth daily.   [DISCONTINUED] ibuprofen  (ADVIL ) 200 MG tablet Take 200 mg by mouth every 6 (six) hours as needed for moderate pain (pain score 4-6).   [DISCONTINUED] albuterol  (VENTOLIN  HFA) 108 (90 Base) MCG/ACT inhaler Inhale 2 puffs into the lungs every 6 (six) hours as needed for wheezing or shortness of breath.   [DISCONTINUED] cyclobenzaprine  (FLEXERIL ) 5 MG tablet Take 1 tablet (5 mg total) by mouth 3 (three) times daily as needed.   [DISCONTINUED] fluticasone  (FLONASE ) 50 MCG/ACT nasal spray Place 1 spray into the nose 2 (two) times daily. (Patient not taking: Reported on 02/05/2024)    [DISCONTINUED] ketorolac  (TORADOL ) 10 MG tablet Take 1 tablet (10 mg total) by mouth every 8 (eight) hours as needed.   [DISCONTINUED] ondansetron  (ZOFRAN -ODT) 4 MG disintegrating tablet Take 1 tablet (4 mg total) by mouth every 6 (six) hours as needed for nausea.   [DISCONTINUED] oxyCODONE -acetaminophen  (PERCOCET) 5-325 MG tablet Take 1 tablet by mouth every 6 (six) hours as needed (breakthrough pain).   [DISCONTINUED] predniSONE  (DELTASONE ) 10 MG tablet Take 1 tablet (10 mg total) by mouth daily.   No facility-administered encounter medications on file as of 02/05/2024.    Past Medical History:  Diagnosis Date   Anemia    Chronic headaches    Cigarette smoker    Dyspnea    has occ sob with exertion since having pneumonia in dec 2024 and pt is anemic   Fibroid uterus    Gastric ulcer    Menorrhagia with irregular cycle    Pneumonia 08/2023    Past Surgical History:  Procedure Laterality Date   ABDOMINAL HYSTERECTOMY     c-section     x2   CHOLECYSTECTOMY     CYSTOSCOPY N/A 10/16/2023   Procedure: CYSTOSCOPY;  Surgeon: Kris Pester, MD;  Location: [facility] ORS;  Service: Gynecology;  Laterality: N/A;   ESOPHAGOGASTRODUODENOSCOPY N/A 02/01/2016   Procedure: ESOPHAGOGASTRODUODENOSCOPY (EGD);  Surgeon: Cassie Click, MD;  Location[facility] ENDOSCOPY;  Service: Endoscopy;  Laterality:  N/A;   ESOPHAGOGASTRODUODENOSCOPY (EGD) WITH PROPOFOL  N/A 04/25/2016   Procedure: ESOPHAGOGASTRODUODENOSCOPY (EGD) WITH PROPOFOL ;  Surgeon: Cassie Click, MD;  Location: [facility] ENDOSCOPY;  Service: Endoscopy;  Laterality: N/A;   ROBOTIC ASSISTED LAPAROSCOPIC HYSTERECTOMY AND SALPINGECTOMY Bilateral 10/16/2023   Procedure: XI ROBOTIC ASSISTED LAPAROSCOPIC HYSTERECTOMY AND SALPINGECTOMY, LYSIS OF ADHESIONS;  Surgeon: Kris Pester, MD;  Location: [facility] ORS;  Service: Gynecology;  Laterality: Bilateral;   TONSILLECTOMY     TUBAL LIGATION      Family History  Family history unknown: Yes     Social History   Socioeconomic History   Marital status: Single    Spouse name: Not on file   Number of children: Not on file   Years of education: Not on file   Highest education level: GED or equivalent  Occupational History   Not on file  Tobacco Use   Smoking status: Every Day    Current packs/day: 1.50    Types: Cigarettes   Smokeless tobacco: Never  Vaping Use   Vaping status: Never Used  Substance and Sexual Activity   Alcohol use: No    Alcohol/week: 0.0 standard drinks of alcohol   Drug use: No   Sexual activity: Not on file  Other Topics Concern   Not on file  Social History Narrative   Not on file   Social Drivers of Health   Financial Resource Strain: Low Risk  (02/05/2024)   Overall Financial Resource Strain (CARDIA)    Difficulty of Paying Living Expenses: Not hard at all  Food Insecurity: No Food Insecurity (02/05/2024)   Hunger Vital Sign    Worried About Running Out of Food in the Last Year: Never true    Ran Out of Food in the Last Year: Never true  Transportation Needs: No Transportation Needs (02/05/2024)   PRAPARE - Administrator, Civil Service (Medical): No    Lack of Transportation (Non-Medical): No  Physical Activity: Sufficiently Active (02/05/2024)   Exercise Vital Sign    Days of Exercise per Week: 7 days    Minutes of Exercise per Session: 150+ min  Stress: No Stress Concern Present (02/05/2024)   [employer] of Occupational Health - Occupational Stress Questionnaire    Feeling of Stress : Not at all  Social Connections: Socially Isolated (02/05/2024)   Social Connection and Isolation Panel [NHANES]    Frequency of Communication with Friends and Family: More than three times a week    Frequency of Social Gatherings with Friends and Family: Three times a week    Attends Religious Services: Never    Active Member of Clubs or Organizations: No    Attends Engineer, structural: Not on file    Marital Status: Never  married  Intimate Partner Violence: Not on file    Review of Systems  All other systems reviewed and are negative.       Objective    BP 130/88   Pulse 78   Temp 98 F (36.7 C)   Ht 5\' 3"  (1.6 m)   LMP 09/30/2023 (Exact Date)   SpO2 96%   BMI 30.66 kg/m   Physical Exam Vitals and nursing note reviewed.  Constitutional:      Appearance: Normal appearance.  HENT:     Head: Normocephalic.     Right Ear: External ear normal.     Left Ear: External ear normal.  Eyes:     Conjunctiva/sclera: Conjunctivae normal.  Cardiovascular:     Rate and  Rhythm: Normal rate.  Pulmonary:     Effort: Pulmonary effort is normal. No respiratory distress.  Abdominal:     Palpations: Abdomen is soft.  Musculoskeletal:        General: Normal range of motion.  Skin:    General: Skin is warm.  Neurological:     Mental Status: She is alert and oriented to person, place, and time.  Psychiatric:        Mood and Affect: Mood normal.        Assessment & Plan:   Problem List Items Addressed This Visit       Nervous and Auditory   Left-sided low back pain with left-sided sciatica - Primary   Recommend conservative management :   Weight loss strategies dicussed, NSAIDs prn, back exercise, Will consider PT if no improvement.      Relevant Medications   acetaminophen  (TYLENOL ) 500 MG tablet   meloxicam  (MOBIC ) 15 MG tablet     Genitourinary   Fibroid uterus   S/p hysterectomy        Other   Obesity (BMI 30.0-34.9)   Other Visit Diagnoses       Cervical cancer screening           Return in about 5 weeks (around 03/11/2024) for Annual.   Vinary K Keshayla Schrum, MD

## 2024-02-05 NOTE — Assessment & Plan Note (Addendum)
 Recommend conservative management :   Weight loss strategies dicussed, NSAIDs prn, Will consider PT if no improvement.

## 2024-02-05 NOTE — Assessment & Plan Note (Signed)
 S/p hysterectomy.

## 2024-03-11 ENCOUNTER — Encounter: Payer: Self-pay | Admitting: Family Medicine

## 2024-03-11 ENCOUNTER — Ambulatory Visit (INDEPENDENT_AMBULATORY_CARE_PROVIDER_SITE_OTHER): Admitting: Family Medicine

## 2024-03-11 VITALS — BP 122/82 | HR 84 | Ht 63.0 in | Wt 178.0 lb

## 2024-03-11 DIAGNOSIS — M5442 Lumbago with sciatica, left side: Secondary | ICD-10-CM

## 2024-03-11 DIAGNOSIS — Z0000 Encounter for general adult medical examination without abnormal findings: Secondary | ICD-10-CM

## 2024-03-11 DIAGNOSIS — Z13220 Encounter for screening for lipoid disorders: Secondary | ICD-10-CM

## 2024-03-11 DIAGNOSIS — Z713 Dietary counseling and surveillance: Secondary | ICD-10-CM

## 2024-03-11 DIAGNOSIS — Z1159 Encounter for screening for other viral diseases: Secondary | ICD-10-CM

## 2024-03-11 DIAGNOSIS — Z131 Encounter for screening for diabetes mellitus: Secondary | ICD-10-CM | POA: Diagnosis not present

## 2024-03-11 DIAGNOSIS — E66811 Obesity, class 1: Secondary | ICD-10-CM | POA: Diagnosis not present

## 2024-03-11 DIAGNOSIS — Z136 Encounter for screening for cardiovascular disorders: Secondary | ICD-10-CM

## 2024-03-11 DIAGNOSIS — G8929 Other chronic pain: Secondary | ICD-10-CM

## 2024-03-11 MED ORDER — SEMAGLUTIDE-WEIGHT MANAGEMENT 0.25 MG/0.5ML ~~LOC~~ SOAJ
0.2500 mg | SUBCUTANEOUS | 0 refills | Status: DC
Start: 2024-03-11 — End: 2024-04-08

## 2024-03-11 NOTE — Assessment & Plan Note (Signed)
 Resolved, she started wearing proper foot wear for good arch support.

## 2024-03-11 NOTE — Assessment & Plan Note (Signed)
 Wt Readings from Last 3 Encounters:  03/11/24 178 lb (80.7 kg)  12/31/23 173 lb 1 oz (78.5 kg)  10/08/23 173 lb 1 oz (78.5 kg)    Continue healthy diet and exercise. Pt will benefit from [facility] to help loose weight. Rx sent for [facility]. Titrate up during next visit. Discussed with pt of possible PA denial, pt verbalized understanding. She is willing to try Wellbutrin if PA gets denied.

## 2024-03-11 NOTE — Progress Notes (Signed)
 Complete physical exam  Patient: Faith Ellis  LETISHIA ELLIOTT   DOB: 1985-11-13   38 y.o. Female  MRN: [id number]  Subjective:    Chief Complaint  Patient presents with   Annual Exam   Eating healthy, avoiding fried foods, eating grilled food.  Faith  L Ellis is a 38 y.o. female who presents today for a complete physical exam. She reports consuming a general diet. The patient does not participate in regular exercise at present. She generally feels well. She reports sleeping well. She does have additional problems to discuss today.    Most recent fall risk assessment:    03/11/2024    2:52 PM  Fall Risk   Falls in the past year? 0  Number falls in past yr: 0  Injury with Fall? 0  Risk for fall due to : No Fall Risks  Follow up Falls evaluation completed     Most recent depression screenings:    03/11/2024    2:52 PM 03/11/2024    2:29 PM  PHQ 2/9 Scores  PHQ - 2 Score 3 0  PHQ- 9 Score 15 0        Patient Care Team: Zorion Nims K, MD as PCP - General (Family Medicine)   Outpatient Medications Prior to Visit  Medication Sig   acetaminophen  (TYLENOL ) 500 MG tablet Take 500 mg by mouth every 6 (six) hours as needed for moderate pain (pain score 4-6).   meloxicam  (MOBIC ) 15 MG tablet Take 1 tablet (15 mg total) by mouth daily. (Patient not taking: Reported on 03/11/2024)   No facility-administered medications prior to visit.    Review of Systems  All other systems reviewed and are negative.         Objective:     BP 122/82   Pulse 84   Ht 5' 3 (1.6 m)   Wt 178 lb (80.7 kg)   LMP 09/30/2023 (Exact Date)   SpO2 99%   BMI 31.53 kg/m    Physical Exam Vitals and nursing note reviewed.  Constitutional:      Appearance: Normal appearance. She is obese.  HENT:     Head: Normocephalic.     Right Ear: External ear normal.     Left Ear: External ear normal.   Eyes:     Conjunctiva/sclera: Conjunctivae normal.    Cardiovascular:     Rate and Rhythm:  Normal rate.  Pulmonary:     Effort: Pulmonary effort is normal. No respiratory distress.  Abdominal:     Palpations: Abdomen is soft.   Musculoskeletal:        General: Normal range of motion.   Skin:    General: Skin is warm.   Neurological:     Mental Status: She is alert and oriented to person, place, and time.   Psychiatric:        Mood and Affect: Mood normal.      No results found for any visits on 03/11/24.      Assessment & Plan:    Routine Health Maintenance and Physical Exam  Immunization History  Administered Date(s) Administered   Tdap 09/05/2020    Health Maintenance  Topic Date Due   HPV VACCINES (1 - 3-dose series) Never done   HIV Screening  Never done   Hepatitis C Screening  Never done   Pneumococcal Vaccine 12-84 Years old (1 of 2 - PCV) Never done   COVID-19 Vaccine (1 - 2024-25 season) Never done   INFLUENZA VACCINE  04/29/2024  DTaP/Tdap/Td (2 - Td or Tdap) 09/05/2030   Meningococcal B Vaccine  Aged Out    Discussed health benefits of physical activity, and encouraged her to engage in regular exercise appropriate for her age and condition.  Problem List Items Addressed This Visit       Nervous and Auditory   Left-sided low back pain with left-sided sciatica   Resolved, she started wearing proper foot wear for good arch support.      Relevant Medications   Semaglutide-Weight Management 0.25 MG/0.5ML SOAJ     Other   Obesity (BMI 30.0-34.9)   Wt Readings from Last 3 Encounters:  03/11/24 178 lb (80.7 kg)  12/31/23 173 lb 1 oz (78.5 kg)  10/08/23 173 lb 1 oz (78.5 kg)    Continue healthy diet and exercise. Pt will benefit from [facility] to help loose weight. Rx sent for [facility]. Titrate up during next visit. Discussed with pt of possible PA denial, pt verbalized understanding. She is willing to try Wellbutrin if PA gets denied.      Relevant Medications   Semaglutide-Weight Management 0.25 MG/0.5ML SOAJ   Other Relevant Orders    Comprehensive metabolic panel with GFR   TSH   Other Visit Diagnoses       Annual physical exam    -  Primary   Relevant Orders   CBC with Differential/Platelet     Encounter for lipid screening for cardiovascular disease       Relevant Orders   Lipid panel     Diabetes mellitus screening       Relevant Orders   Hemoglobin A1c     Encounter for hepatitis C screening test for low risk patient       Relevant Orders   Hepatitis C antibody     Weight loss counseling, encounter for          Return in about 4 weeks (around 04/08/2024) for weight loss encounter.     Vinary K Mariabelen Pressly, MD

## 2024-03-12 LAB — CBC WITH DIFFERENTIAL/PLATELET
Basophils Absolute: 0.1 10*3/uL (ref 0.0–0.2)
Basos: 1 %
EOS (ABSOLUTE): 0.2 10*3/uL (ref 0.0–0.4)
Eos: 2 %
Hematocrit: 43.1 % (ref 34.0–46.6)
Hemoglobin: 13.7 g/dL (ref 11.1–15.9)
Immature Grans (Abs): 0 10*3/uL (ref 0.0–0.1)
Immature Granulocytes: 0 %
Lymphocytes Absolute: 2 10*3/uL (ref 0.7–3.1)
Lymphs: 27 %
MCH: 27.1 pg (ref 26.6–33.0)
MCHC: 31.8 g/dL (ref 31.5–35.7)
MCV: 85 fL (ref 79–97)
Monocytes Absolute: 0.4 10*3/uL (ref 0.1–0.9)
Monocytes: 6 %
Neutrophils Absolute: 4.5 10*3/uL (ref 1.4–7.0)
Neutrophils: 64 %
Platelets: 245 10*3/uL (ref 150–450)
RBC: 5.06 x10E6/uL (ref 3.77–5.28)
RDW: 16.2 % — ABNORMAL HIGH (ref 11.7–15.4)
WBC: 7.2 10*3/uL (ref 3.4–10.8)

## 2024-03-12 LAB — HEMOGLOBIN A1C
Est. average glucose Bld gHb Est-mCnc: 100 mg/dL
Hgb A1c MFr Bld: 5.1 % (ref 4.8–5.6)

## 2024-03-12 LAB — COMPREHENSIVE METABOLIC PANEL WITH GFR
ALT: 63 IU/L — ABNORMAL HIGH (ref 0–32)
AST: 31 IU/L (ref 0–40)
Albumin: 4.4 g/dL (ref 3.9–4.9)
Alkaline Phosphatase: 137 IU/L — ABNORMAL HIGH (ref 44–121)
BUN/Creatinine Ratio: 10 (ref 9–23)
BUN: 6 mg/dL (ref 6–20)
Bilirubin Total: 0.3 mg/dL (ref 0.0–1.2)
CO2: 19 mmol/L — ABNORMAL LOW (ref 20–29)
Calcium: 9.1 mg/dL (ref 8.7–10.2)
Chloride: 103 mmol/L (ref 96–106)
Creatinine, Ser: 0.63 mg/dL (ref 0.57–1.00)
Globulin, Total: 2.3 g/dL (ref 1.5–4.5)
Glucose: 84 mg/dL (ref 70–99)
Potassium: 4.4 mmol/L (ref 3.5–5.2)
Sodium: 138 mmol/L (ref 134–144)
Total Protein: 6.7 g/dL (ref 6.0–8.5)
eGFR: 116 mL/min/{1.73_m2} (ref >59)

## 2024-03-12 LAB — TSH: TSH: 1.67 u[IU]/mL (ref 0.450–4.500)

## 2024-03-12 LAB — LIPID PANEL
Chol/HDL Ratio: 4.9 ratio — ABNORMAL HIGH (ref 0.0–4.4)
Cholesterol, Total: 187 mg/dL (ref 100–199)
HDL: 38 mg/dL — ABNORMAL LOW (ref >39)
LDL Chol Calc (NIH): 118 mg/dL — ABNORMAL HIGH (ref 0–99)
Triglycerides: 175 mg/dL — ABNORMAL HIGH (ref 0–149)
VLDL Cholesterol Cal: 31 mg/dL (ref 5–40)

## 2024-03-12 LAB — HEPATITIS C ANTIBODY: Hep C Virus Ab: NONREACTIVE

## 2024-03-14 ENCOUNTER — Ambulatory Visit: Payer: Self-pay | Admitting: Family Medicine

## 2024-03-14 ENCOUNTER — Telehealth: Payer: Self-pay

## 2024-03-15 ENCOUNTER — Other Ambulatory Visit (HOSPITAL_COMMUNITY): Payer: Self-pay

## 2024-03-15 ENCOUNTER — Telehealth: Payer: Self-pay | Admitting: Pharmacy Technician

## 2024-03-15 NOTE — Telephone Encounter (Signed)
 Pharmacy Patient Advocate Encounter  Received notification from [facility] [employer] that Prior Authorization for [facility] 0.25MG /0.5ML auto-injectors has been APPROVED from 03/25/2024 to 03/15/2025. Ran test claim, Copay is $4.00. This test claim was processed through [facility]- copay amounts may vary at other pharmacies due to pharmacy/plan contracts, or as the patient moves through the different stages of their insurance plan.   PA #/Case ID/Reference #: [id number]

## 2024-03-15 NOTE — Telephone Encounter (Signed)
 PA request has been Started. New Encounter has been or will be created for follow up. For additional info see Pharmacy Prior Auth telephone encounter from 03/15/2024.

## 2024-03-15 NOTE — Telephone Encounter (Signed)
 Pharmacy Patient Advocate Encounter   Received notification from Pt Calls Messages that prior authorization for Wegovy 0.25MG /0.5ML auto-injectors is required/requested.   Insurance verification completed.   The patient is insured through [facility] [place] .   Per test claim: PA required; PA submitted to above mentioned insurance via CoverMyMeds Key/confirmation #/EOC [id number] Status is pending

## 2024-03-16 NOTE — Telephone Encounter (Signed)
 Attempted to contact patient. No answer, left VM message informing her that medication was approved and should contact pharmacy for pickup.

## 2024-03-20 ENCOUNTER — Emergency Department

## 2024-03-20 ENCOUNTER — Encounter: Payer: Self-pay | Admitting: Emergency Medicine

## 2024-03-20 ENCOUNTER — Other Ambulatory Visit: Payer: Self-pay

## 2024-03-20 ENCOUNTER — Emergency Department
Admission: EM | Admit: 2024-03-20 | Discharge: 2024-03-20 | Disposition: A | Discharge status: Home / Self Care | Attending: Emergency Medicine | Admitting: Emergency Medicine

## 2024-03-20 DIAGNOSIS — M79671 Pain in right foot: Secondary | ICD-10-CM | POA: Diagnosis present

## 2024-03-20 DIAGNOSIS — H6693 Otitis media, unspecified, bilateral: Secondary | ICD-10-CM | POA: Insufficient documentation

## 2024-03-20 DIAGNOSIS — M722 Plantar fascial fibromatosis: Secondary | ICD-10-CM | POA: Diagnosis not present

## 2024-03-20 MED ORDER — AMOXICILLIN-POT CLAVULANATE 875-125 MG PO TABS: 1.0000 | ORAL_TABLET | Freq: Two times a day (BID) | ORAL | 0 refills | Status: AC

## 2024-03-20 MED ORDER — IBUPROFEN 400 MG PO TABS
400.0000 mg | ORAL_TABLET | Freq: Once | ORAL | Status: AC
  Administered 2024-03-20: 400 mg via ORAL
  Filled 2024-03-20: qty 1

## 2024-03-20 NOTE — ED Provider Notes (Signed)
 [facility] Provider Note    Event Date/Time   First MD Initiated Contact with Patient 03/20/24 1500     (approximate)   History   Foot Pain   HPI Faith  L Ellis is a 38 y.o. female presenting to the emergency department with right foot pain x 1 day. Patient states she was laying in bed when she first felt the pain and it has been ongoing since then. It was worse when she woke up this morning. The pain is along the lateral aspect of her foot. She denies any injury or fall. She has not had this concern before. Denies knee pain, fever, erythema, edema, knee pain, numbness, tingling. Pain 6/10 in triage, she states she is ambulatory on her tip toes. She has not taken any medications or tried any at-home remedies for her pain.   She also endorses pain in both of her ears.  She states she has chronic ear infections, was recently treated 3 weeks ago.  Pain did resolve in her ears but has recently come back.  Past medical history includes anemia, left-sided sciatica, obesity. Allergies include Keflex.  Physical Exam   Triage Vital Signs: ED Triage Vitals  Encounter Vitals Group     BP 03/20/24 1458 (!) 116/48     Girls Systolic BP Percentile --      Girls Diastolic BP Percentile --      Boys Systolic BP Percentile --      Boys Diastolic BP Percentile --      Pulse Rate 03/20/24 1458 84     Resp 03/20/24 1458 14     Temp 03/20/24 1458 98.8 F (37.1 C)     Temp Source 03/20/24 1458 Oral     SpO2 03/20/24 1458 98 %     Weight 03/20/24 1455 178 lb (80.7 kg)     Height 03/20/24 1455 5' 3 (1.6 m)     Head Circumference --      Peak Flow --      Pain Score 03/20/24 1455 6     Pain Loc --      Pain Education --      Exclude from Growth Chart --     Most recent vital signs: Vitals:   03/20/24 1458 03/20/24 1605  BP: (!) 116/48 122/75  Pulse: 84   Resp: 14   Temp: 98.8 F (37.1 C)   SpO2: 98%     General: Well-appearing, in no acute distress.  Appears stated age. Head: Normocephalic, atraumatic. Eyes: PERRLA. No scleral icterus or conjunctival injection. Ears/Nose/Throat: TMs intact b/l, but erythematous and bulging. Can visualize cone of light b/l. No perforation b/l. Nares patent, no nasal discharge. Dentition intact. CV:  Good peripheral perfusion.  Resp:  Normal effort.  Abd:  No distention.  Other:  Radial pulses 2+ bilaterally. Tender to palpation along the lateral aspect of her right foot in the cuboid region, the heel,  and along the proximal portion of the 5th metatarsal. No pain in the midfoot or along the lateral or medial malleolus. Able to perform dorsiflexion, plantarflexion, inversion and eversion with ease. 5/5 strength bilateral ankles. Inversion does cause her some discomfort. Ambulatory on her tip toes due to pain, but can walk. No erythema, edema, or rash noted. No pain with palpation of bilateral knees. Performs knee flexion and extension with ease b/l.   ED Results / Procedures / Treatments   Labs (all labs ordered are listed, but only abnormal results are displayed) Labs  Reviewed - No data to display   EKG     RADIOLOGY  X ray of right foot ordered.  Imaging was independently viewed by me as well as the radiologist. I agree with the radiologist's report that there are no acute fractures or dislocations.   PROCEDURES:  Critical Care performed: No  Procedures   MEDICATIONS ORDERED IN ED: Medications  ibuprofen  (ADVIL ) tablet 400 mg (400 mg Oral Given 03/20/24 1530)     IMPRESSION / MDM / ASSESSMENT AND PLAN / ED COURSE  I reviewed the triage vital signs and the nursing notes.                              Differential diagnosis includes, but is not limited to, plantar fasciitis, Jones fracture, calcaneal fracture, ankle sprain, peroneal nerve injury, otitis media, serous otitis media, tympanic membrane perforation  Patient's presentation is most consistent with acute complicated illness /  injury requiring diagnostic workup.  Patient is a 38 y/o female presenting with symptoms most consistent with plantar fasciitis and bilateral otitis media.  She had pain along her heel and the lateral aspect of her foot without any fall or injury worse with resting with waking up in the morning.  We discussed using NSAIDs, purchasing shoe orthotics, wearing shoes in the home, and she was given exercises to help with stretching, she can also use ice.  She was given ibuprofen  400 mg in the emergency department and also a postop shoe to help with comfort.  Work note given regarding wearing postop shoe as needed for comfort and stability.  She also had bilateral erythematous, bulging tympanic membranes consistent with otitis media.  As she does have a history of multiple ear infections, I did prescribe her Augmentin .  She can take a probiotic of her choice with this to help with any nauseousness or stomach pain.  She has a Keflex allergy listed with a rash in her chart, but she has taken Augmentin  before multiple times without any symptoms of an allergic reaction.  We did discuss returning to the emergency department if she were to experience any symptoms of an allergic reaction. BP in triage 116/48, repeat at discharge shows 125/75; all vital signs within normal range prior to discharge.  Patient was given the opportunity to ask questions, all questions were answered.  Emergency department return precautions were discussed with the patient.  Patient is in agreement to the treatment plan.  Patient is stable for discharge.    FINAL CLINICAL IMPRESSION(S) / ED DIAGNOSES   Final diagnoses:  Plantar fasciitis of right foot  Bilateral otitis media, unspecified otitis media type     Rx / DC Orders   ED Discharge Orders          Ordered    Post-op shoe        03/20/24 1534    amoxicillin -clavulanate (AUGMENTIN ) 875-125 MG tablet  2 times daily        03/20/24 1609             Note:  This  document was prepared using Dragon voice recognition software and may include unintentional dictation errors.    Sheron Salm, PA-C 03/20/24 1624    Suzanne Kirsch, MD 03/20/24 [phone]

## 2024-03-20 NOTE — Discharge Instructions (Addendum)
 You were seen in the emergency department for right foot pain. This seems most consistent with plantar fasciitis, which is a swelling of the tissue along the bottom part of your foot. Please take ibuprofen  based on the dosing on the bottle to help with pain. Please also consider purchasing shoe orthotics to help with comfort and be sure to wear shoes inside your home. Stretching and ice can also help. Please also pick up and take the antibiotic as prescribed for your ear infection.  Please return to the emergency department if you have any new or concerning symptoms or any medication reactions such as rash, fever, nausea, vomiting, difficulty breathing, hives, or feeling like your throat is closing up.

## 2024-03-20 NOTE — ED Triage Notes (Signed)
 Pt c/o right foot pain since yesterday, no known injury. No falls or injury and pt states foot is not swollen but she is unable to tolerate weight bearing without severe pain. Ambulatory with pronounced limp at time of triage. Pain rated 6/10 with ambulation.

## 2024-03-20 NOTE — ED Notes (Signed)
Post op shoe placed.

## 2024-03-22 ENCOUNTER — Telehealth: Payer: Self-pay

## 2024-03-22 NOTE — Transitions of Care (Post Inpatient/ED Visit) (Signed)
   03/22/2024  Name: Faith Ellis MRN: [id number] DOB: 17-Sep-1986  Today's TOC FU Call Status: Today's TOC FU Call Status:: Successful TOC FU Call Completed TOC FU Call Complete Date: 03/22/24 Patient's Name and Date of Birth confirmed.  Transition Care Management Follow-up Telephone Call Date of Discharge: 03/20/24 Discharge Facility: [facility]) Type of Discharge: Emergency Department Reason for ED Visit: Other: (Plantar Fasical Fibromatosis) How have you been since you were released from the hospital?: Better (is wearing a boot that was given at the ED and bought shoe insoles) Any questions or concerns?: No  Items Reviewed: Did you receive and understand the discharge instructions provided?: Yes Medications obtained,verified, and reconciled?: Yes (Medications Reviewed) Any new allergies since your discharge?: No Dietary orders reviewed?: NA Do you have support at home?: Yes People in Home [RPT]: child(ren), adult  Medications Reviewed Today: Medications Reviewed Today     Reviewed by Camacho Ocampo, Esti Demello M, CMA (Certified Medical Assistant) on 03/22/24 at 1338  Med List Status: <None>   Medication Order Taking? Sig Documenting Provider Last Dose Status Informant  acetaminophen  (TYLENOL ) 500 MG tablet [id number] Yes Take 500 mg by mouth every 6 (six) hours as needed for moderate pain (pain score 4-6). [provider]  Active   amoxicillin -clavulanate (AUGMENTIN ) 875-125 MG tablet [id number] Yes Take 1 tablet by mouth 2 (two) times daily for 10 days. Sheron, Summer, PA-C  Active   meloxicam  (MOBIC ) 15 MG tablet [id number]  Take 1 tablet (15 mg total) by mouth daily.  Patient not taking: Reported on 03/22/2024   Kotturi, Vinay K, MD  Active   Semaglutide -Weight Management 0.25 MG/0.5ML EMMANUEL [id number] Yes Inject 0.25 mg into the skin once a week. Kotturi, Vinay K, MD  Active             Home Care and Equipment/Supplies: Were Home Health  Services Ordered?: NA Any new equipment or medical supplies ordered?: NA  Functional Questionnaire: Do you need assistance with bathing/showering or dressing?: No Do you need assistance with meal preparation?: No Do you need assistance with eating?: No Do you have difficulty maintaining continence: No Do you need assistance with getting out of bed/getting out of a chair/moving?: No Do you have difficulty managing or taking your medications?: No  Follow up appointments reviewed: PCP Follow-up appointment confirmed?: Yes Date of PCP follow-up appointment?: 04/08/24 Follow-up Provider: Patient stated she does not want to come in, is scheduled for an office visit july 11. Will call sooner if needed. Specialist Hospital Follow-up appointment confirmed?: NA Do you need transportation to your follow-up appointment?: No Do you understand care options if your condition(s) worsen?: Yes-patient verbalized understanding    Breylen Agyeman Kristie Harsh, CMA  [facility] at [facility] [address] [place], [place] [address] Office: [phone] Fax: [phone]

## 2024-04-08 ENCOUNTER — Encounter: Payer: Self-pay | Admitting: Family Medicine

## 2024-04-08 ENCOUNTER — Ambulatory Visit (INDEPENDENT_AMBULATORY_CARE_PROVIDER_SITE_OTHER): Admitting: Family Medicine

## 2024-04-08 VITALS — BP 118/72 | HR 81 | Ht 63.0 in | Wt 173.1 lb

## 2024-04-08 DIAGNOSIS — Z713 Dietary counseling and surveillance: Secondary | ICD-10-CM

## 2024-04-08 DIAGNOSIS — E66811 Obesity, class 1: Secondary | ICD-10-CM

## 2024-04-08 DIAGNOSIS — R21 Rash and other nonspecific skin eruption: Secondary | ICD-10-CM | POA: Diagnosis not present

## 2024-04-08 MED ORDER — SEMAGLUTIDE(0.25 OR 0.5MG/DOS) 2 MG/3ML ~~LOC~~ SOPN: 0.5000 mg | PEN_INJECTOR | SUBCUTANEOUS | 0 refills | Status: DC

## 2024-04-08 MED ORDER — TRIAMCINOLONE ACETONIDE 0.1 % EX CREA: 1.0000 | TOPICAL_CREAM | Freq: Two times a day (BID) | CUTANEOUS | 0 refills | Status: AC

## 2024-04-08 NOTE — Progress Notes (Signed)
   Established Patient Office Visit  Subjective   Patient ID: Faith  LITTIE Ellis, female    DOB: 1986/07/07  Age: 38 y.o. MRN: [id number]  Chief Complaint  Patient presents with   Weight Check     Assessment & Plan:   Problem List Items Addressed This Visit       Other   Obesity (BMI 30.0-34.9) - Primary   Relevant Medications   Semaglutide ,0.25 or 0.5MG /DOS, 2 MG/3ML SOPN   Other Visit Diagnoses       Weight loss counseling, encounter for       Relevant Medications   Semaglutide ,0.25 or 0.5MG /DOS, 2 MG/3ML SOPN     Rash       Relevant Medications   triamcinolone  cream (KENALOG ) 0.1 %      Wt Readings from Last 3 Encounters:  04/08/24 173 lb 2 oz (78.5 kg)  03/20/24 178 lb (80.7 kg)  03/11/24 178 lb (80.7 kg)  Patient responding. Continue healthy diet and exercise. Pt tolerating well, will benefit further with increasing wegovy  dose as above. Rx sent.  Rash : Contact Dermatitis?  Will do trial of Kenalog . Return in about 4 weeks (around 05/06/2024) for weight loss follow up.   Here for weight loss follow up.  Overall she is doing good, she does have some nauseous feeling on day of injection.  Denies vomiting, abdominal pain.  She is also doing life style changes:   Diet : Trying to eat healthy.  Exercise:  Walking.  C/o skin rash over right shin and left wrist. Started last summer after being on beach. She tried otc hydrocortisone with some relief. She has mid itching. Denies new lesions or change in size.         Review of Systems  All other systems reviewed and are negative.     Objective:     BP 118/72   Pulse 81   Ht 5' 3 (1.6 m)   Wt 173 lb 2 oz (78.5 kg)   LMP 09/30/2023 (Exact Date)   SpO2 98%   BMI 30.67 kg/m    Physical Exam Vitals and nursing note reviewed.  Constitutional:      Appearance: Normal appearance.  HENT:     Head: Normocephalic.     Right Ear: External ear normal.     Left Ear: External ear normal.  Eyes:      Conjunctiva/sclera: Conjunctivae normal.  Cardiovascular:     Rate and Rhythm: Normal rate.  Pulmonary:     Effort: Pulmonary effort is normal. No respiratory distress.  Abdominal:     Palpations: Abdomen is soft.  Musculoskeletal:        General: Normal range of motion.  Skin:    General: Skin is warm.     Findings: Rash present.         Comments: Dry mild red blanching spot noted over right shin. Rash on medial wrist is not visible that much.  Neurological:     Mental Status: She is alert and oriented to person, place, and time.  Psychiatric:        Mood and Affect: Mood normal.      No results found for any visits on 04/08/24.    The ASCVD Risk score (Arnett DK, et al., 2019) failed to calculate for the following reasons:   The 2019 ASCVD risk score is only valid for ages 36 to 57      Ladoris MARLA Ny, MD

## 2024-04-13 ENCOUNTER — Telehealth: Payer: Self-pay

## 2024-04-13 NOTE — Telephone Encounter (Signed)
 Please complete PA for Wegovy  0.5 MG.  KP

## 2024-04-14 ENCOUNTER — Telehealth: Payer: Self-pay | Admitting: Pharmacy Technician

## 2024-04-14 ENCOUNTER — Other Ambulatory Visit (HOSPITAL_COMMUNITY): Payer: Self-pay

## 2024-04-14 ENCOUNTER — Other Ambulatory Visit: Payer: Self-pay

## 2024-04-14 MED ORDER — WEGOVY 0.5 MG/0.5ML ~~LOC~~ SOAJ: 0.5000 mg | SUBCUTANEOUS | 0 refills | Status: DC

## 2024-04-14 NOTE — Telephone Encounter (Signed)
 Noted  KP

## 2024-04-14 NOTE — Telephone Encounter (Signed)
 PA request has been Cancelled. New Encounter has been or will be created for follow up. For additional info see Pharmacy Prior Auth telephone encounter from 0717/2025.

## 2024-04-14 NOTE — Telephone Encounter (Signed)
 Pharmacy Patient Advocate Encounter   Received notification from Pt Calls Messages that prior authorization for Wegovy  0.5mg  is required/requested.   Insurance verification completed.   The patient is insured through [facility] [place] .   Per test claim:  WEGOVY  0.5 MG is preferred by the insurance.  If suggested medication is appropriate, Please send in a new RX and discontinue this one. If not, please advise as to why it's not appropriate so that we may request a Prior Authorization. Please note, some preferred medications may still require a PA.  If the suggested medications have not been trialed and there are no contraindications to their use, the PA will not be submitted, as it will not be approved.  [facility] has an order for Ozempic  that was sent on 04/08/2024 that insurance is not paying for. The order for Wegovy  was discontinued and it was probably in error.  The pharmacy just need a prescription for Wegovy  0.5 mg and her copay will be $4.00.  The order for Ozempic  is below:

## 2024-05-13 ENCOUNTER — Telehealth: Payer: Self-pay

## 2024-05-13 ENCOUNTER — Ambulatory Visit (INDEPENDENT_AMBULATORY_CARE_PROVIDER_SITE_OTHER): Admitting: Family Medicine

## 2024-05-13 ENCOUNTER — Encounter: Payer: Self-pay | Admitting: Family Medicine

## 2024-05-13 ENCOUNTER — Ambulatory Visit: Admitting: Family Medicine

## 2024-05-13 VITALS — BP 122/84 | HR 76 | Ht 63.0 in | Wt 166.0 lb

## 2024-05-13 DIAGNOSIS — Z713 Dietary counseling and surveillance: Secondary | ICD-10-CM | POA: Diagnosis not present

## 2024-05-13 DIAGNOSIS — E66811 Obesity, class 1: Secondary | ICD-10-CM | POA: Diagnosis not present

## 2024-05-13 MED ORDER — WEGOVY 1 MG/0.5ML ~~LOC~~ SOAJ: 1.0000 mg | SUBCUTANEOUS | 0 refills | Status: DC

## 2024-05-13 NOTE — Telephone Encounter (Signed)
 Please complete PA for Wegovy  1 MG.  KP

## 2024-05-13 NOTE — Progress Notes (Signed)
   Established Patient Office Visit  Subjective   Patient ID: Faith  LITTIE Ellis, female    DOB: August 17, 1986  Age: 38 y.o. MRN: [id number]  Chief Complaint  Patient presents with   Weight Check    No side effects, down 7 lbs       Assessment & Plan:   Problem List Items Addressed This Visit       Other   Obesity (BMI 30.0-34.9) - Primary   Relevant Medications   WEGOVY  1 MG/0.5ML SOAJ SQ injection   Other Visit Diagnoses       Weight loss counseling, encounter for       Relevant Medications   WEGOVY  1 MG/0.5ML SOAJ SQ injection      Wt Readings from Last 3 Encounters:  05/13/24 166 lb (75.3 kg)  04/08/24 173 lb 2 oz (78.5 kg)  03/20/24 178 lb (80.7 kg)  Continue lifestyle changes, diet modifications, exercising. Wegovy  dose increased to 1 mg weekly.  New prescription sent. Follow-up 4 weeks for dose adjustment. No follow-ups on file.   Patient here for weight loss encounter.   Patient reports that she is doing well with Wegovy .   Reports she is continuing dieting, exercising at home.   She is planning to start gym.  Wt Readings from Last 3 Encounters: 05/13/24 : 166 lb (75.3 kg) 04/08/24 : 173 lb 2 oz (78.5 kg) 03/20/24 : 178 lb (80.7 kg) Patient is tolerating medication well.  Denies any side effects.  Requesting increasing the dose.      Review of Systems  All other systems reviewed and are negative.     Objective:     BP 122/84   Pulse 76   Ht 5' 3 (1.6 m)   Wt 166 lb (75.3 kg)   LMP 09/30/2023 (Exact Date)   SpO2 97%   BMI 29.41 kg/m     Physical Exam Vitals and nursing note reviewed.  Constitutional:      Appearance: Normal appearance.  HENT:     Head: Normocephalic.     Right Ear: External ear normal.     Left Ear: External ear normal.  Eyes:     Conjunctiva/sclera: Conjunctivae normal.  Cardiovascular:     Rate and Rhythm: Normal rate.  Pulmonary:     Effort: Pulmonary effort is normal. No respiratory distress.  Abdominal:      Palpations: Abdomen is soft.  Musculoskeletal:        General: Normal range of motion.  Skin:    General: Skin is warm.  Neurological:     Mental Status: She is alert and oriented to person, place, and time.  Psychiatric:        Mood and Affect: Mood normal.      No results found for any visits on 05/13/24.     The ASCVD Risk score (Arnett DK, et al., 2019) failed to calculate for the following reasons:   The 2019 ASCVD risk score is only valid for ages 35 to 37      Ladoris MARLA Ny, MD

## 2024-05-16 ENCOUNTER — Telehealth: Payer: Self-pay | Admitting: Pharmacy Technician

## 2024-05-16 ENCOUNTER — Other Ambulatory Visit (HOSPITAL_COMMUNITY): Payer: Self-pay

## 2024-05-16 NOTE — Telephone Encounter (Signed)
 Good morning Faith Ellis, her PA is good until 02/2025 please see telephone encounter 03/15/2025.  Wegovy  1 mg is refill too soon as of today last filled 05/13/2024 next refill date is 06/02/2024.

## 2024-05-16 NOTE — Telephone Encounter (Signed)
 Noted  KP

## 2024-05-16 NOTE — Telephone Encounter (Signed)
 Wegovy  PA good until 02/2025

## 2024-06-09 ENCOUNTER — Other Ambulatory Visit: Payer: Self-pay

## 2024-06-09 ENCOUNTER — Encounter: Payer: Self-pay | Admitting: Family Medicine

## 2024-06-09 ENCOUNTER — Telehealth: Payer: Self-pay

## 2024-06-09 ENCOUNTER — Ambulatory Visit (INDEPENDENT_AMBULATORY_CARE_PROVIDER_SITE_OTHER): Admitting: Family Medicine

## 2024-06-09 VITALS — BP 120/74 | HR 94 | Ht 63.0 in | Wt 163.0 lb

## 2024-06-09 DIAGNOSIS — E66811 Obesity, class 1: Secondary | ICD-10-CM

## 2024-06-09 DIAGNOSIS — Z8669 Personal history of other diseases of the nervous system and sense organs: Secondary | ICD-10-CM

## 2024-06-09 DIAGNOSIS — Z713 Dietary counseling and surveillance: Secondary | ICD-10-CM

## 2024-06-09 MED ORDER — WEGOVY 1 MG/0.5ML ~~LOC~~ SOAJ: 1.0000 mg | SUBCUTANEOUS | 0 refills | Status: DC

## 2024-06-09 NOTE — Telephone Encounter (Signed)
 Please review.  KP

## 2024-06-09 NOTE — Progress Notes (Signed)
 Established Patient Office Visit  Subjective   Patient ID: Faith  LITTIE Ellis, female    DOB: 19-Jan-1986  Age: 38 y.o. MRN: [id number]  Chief Complaint  Patient presents with   Weight Check   Otitis Media    Reoccurring Ear Infections.     Assessment & Plan:   Problem List Items Addressed This Visit       Other   Obesity (BMI 30.0-34.9)   Relevant Medications   WEGOVY  1 MG/0.5ML SOAJ SQ injection   Other Visit Diagnoses       History of recurrent ear infection    -  Primary   Relevant Orders   Ambulatory referral to ENT     Weight loss counseling, encounter for       Relevant Medications   WEGOVY  1 MG/0.5ML SOAJ SQ injection     Assessment and Plan Assessment & Plan Recurrent otitis media and eustachian tube dysfunction Recurrent otitis media with eustachian tube dysfunction, partially responsive to Z-Pak and Flonase . Considered ENT referral due to recurrent infections. - Refer to ENT specialist for further evaluation and management. - Advise to return if symptoms persist or worsen.  Obesity, class 1 Obesity, class 1, managed with Wegovy  and lifestyle modifications. Mild side effects reported, with modest weight loss. Current dose preferred. - Continue Wegovy  1 MG/0.5ML SQ injection weekly. - Schedule follow-up in three months to reassess and consider dose adjustment. - Advise to contact via MyChart if dose increase is desired before next follow-up.    Return in about 3 months (around 09/08/2024) for weight loss.   HPI Discussed the use of AI scribe software for clinical note transcription with the patient, who gave verbal consent to proceed.  History of Present Illness Faith Ellis is a 38 year old female with recurrent ear infections who presents with severe headaches and runny nose.  She has experienced severe headaches and a persistent runny nose for two weeks. Her headaches are typically associated with ear infections, which she has frequently  experienced since childhood. She started a Z-Pak and Flonase  the day before yesterday, with two doses taken yesterday and one today.  Symptoms include an itchy sensation in her ears, particularly the left ear, and occasional stabbing pain in her jaw on the same side. No ear discharge or fever is present. This is her third episode of double ear infections in the last four months. She has significant scar tissue in her left ear from past infections. Previously, she was treated with Moxiclav, but the infection persisted in her left ear, leading to the prescription of a Z-Pak instead.   Weight loss encounter:  Wt Readings from Last 3 Encounters:  06/09/24 163 lb (73.9 kg)  05/13/24 166 lb (75.3 kg)  04/08/24 173 lb 2 oz (78.5 kg)    Pt is responding to Wegovy , denies major side effects. Wants to continue same dose.  She is doing life style changes : Diet and exercise. Review of Systems  All other systems reviewed and are negative.     Objective:     Pulse 94   Ht 5' 3 (1.6 m)   Wt 163 lb (73.9 kg)   LMP 09/30/2023 (Exact Date)   SpO2 100%   BMI 28.87 kg/m    Physical Exam Vitals and nursing note reviewed.  Constitutional:      Appearance: Normal appearance.  HENT:     Head: Normocephalic.     Right Ear: External ear normal.     Left Ear:  External ear normal.  Eyes:     Conjunctiva/sclera: Conjunctivae normal.  Cardiovascular:     Rate and Rhythm: Normal rate.  Pulmonary:     Effort: Pulmonary effort is normal. No respiratory distress.  Abdominal:     Palpations: Abdomen is soft.  Musculoskeletal:        General: Normal range of motion.  Skin:    General: Skin is warm.  Neurological:     Mental Status: She is alert and oriented to person, place, and time.  Psychiatric:        Mood and Affect: Mood normal.      No results found for any visits on 06/09/24.    The ASCVD Risk score (Arnett DK, et al., 2019) failed to calculate for the following reasons:   The 2019  ASCVD risk score is only valid for ages 59 to 19      Ladoris MARLA Ny, MD

## 2024-06-09 NOTE — Telephone Encounter (Signed)
 Patient would like PA done for Wegovy  1MG  weekly. She does not want to increase dose this time. She will stay on the same dose due to infection.   Thank you!

## 2024-06-10 ENCOUNTER — Ambulatory Visit: Admitting: Family Medicine

## 2024-06-10 ENCOUNTER — Other Ambulatory Visit (HOSPITAL_COMMUNITY): Payer: Self-pay

## 2024-06-10 NOTE — Telephone Encounter (Signed)
 Good morning Chassidy, Ms[facility] [place] Wegovy  1 mg/0.7ml was filled 06/09/2024, no PA needed at this time.

## 2024-06-23 ENCOUNTER — Other Ambulatory Visit (HOSPITAL_COMMUNITY): Payer: Self-pay

## 2024-06-23 ENCOUNTER — Telehealth: Payer: Self-pay | Admitting: Pharmacy Technician

## 2024-06-23 ENCOUNTER — Other Ambulatory Visit: Payer: Self-pay | Admitting: Family Medicine

## 2024-06-23 DIAGNOSIS — E66811 Obesity, class 1: Secondary | ICD-10-CM

## 2024-06-23 DIAGNOSIS — Z713 Dietary counseling and surveillance: Secondary | ICD-10-CM

## 2024-06-23 MED ORDER — WEGOVY 1.7 MG/0.75ML ~~LOC~~ SOAJ
1.7000 mg | SUBCUTANEOUS | 0 refills | Status: AC
Start: 2024-06-23 — End: ?

## 2024-06-23 NOTE — Telephone Encounter (Signed)
 Please let the pt know about it. Ty

## 2024-06-23 NOTE — Telephone Encounter (Signed)
 Please review.  KP

## 2024-06-23 NOTE — Telephone Encounter (Signed)
 Pharmacy Patient Advocate Encounter  Effective October 1st, [place] will discontinue coverage of GLP1 medications for weight loss (such as Wegovy and Zepbound ), unless the patient has a documented history of a heart attack or stroke. Zepbound  will continue to be covered only for patients with moderate to severe sleep apnea (AHI 15-30). Because of this change, the prior authorization team will not be submitting new PA requests for GLP1 medications prescribed for weight loss between now and October 1st, as patients will be unable to continue therapy under Medicaid coverage.

## 2024-06-30 DIAGNOSIS — Z90710 Acquired absence of both cervix and uterus: Secondary | ICD-10-CM | POA: Insufficient documentation

## 2024-09-06 ENCOUNTER — Ambulatory Visit: Admitting: Family Medicine

## 2024-09-08 ENCOUNTER — Ambulatory Visit: Admitting: Family Medicine
